# Patient Record
Sex: Male | Born: 1940 | Race: White | Hispanic: No | Marital: Married
Health system: Southern US, Academic
[De-identification: ages and names within clinical notes are randomized; demographics above are authoritative.]

## PROBLEM LIST (undated history)

## (undated) ENCOUNTER — Telehealth: Attending: Hematology & Oncology | Primary: Hematology & Oncology

## (undated) ENCOUNTER — Encounter: Attending: Hematology & Oncology | Primary: Hematology & Oncology

## (undated) ENCOUNTER — Encounter

## (undated) ENCOUNTER — Telehealth
Attending: Pharmacist Clinician (PhC)/ Clinical Pharmacy Specialist | Primary: Pharmacist Clinician (PhC)/ Clinical Pharmacy Specialist

## (undated) ENCOUNTER — Non-Acute Institutional Stay: Payer: MEDICARE | Attending: Hematology & Oncology | Primary: Hematology & Oncology

## (undated) ENCOUNTER — Telehealth

## (undated) ENCOUNTER — Ambulatory Visit

## (undated) DIAGNOSIS — I482 Chronic atrial fibrillation, unspecified: Secondary | ICD-10-CM

## (undated) DIAGNOSIS — N3289 Other specified disorders of bladder: Secondary | ICD-10-CM

## (undated) DIAGNOSIS — G4733 Obstructive sleep apnea (adult) (pediatric): Secondary | ICD-10-CM

## (undated) DIAGNOSIS — Z9989 Dependence on other enabling machines and devices: Secondary | ICD-10-CM

## (undated) DIAGNOSIS — R7303 Prediabetes: Secondary | ICD-10-CM

## (undated) DIAGNOSIS — G629 Polyneuropathy, unspecified: Secondary | ICD-10-CM

## (undated) DIAGNOSIS — I5032 Chronic diastolic (congestive) heart failure: Secondary | ICD-10-CM

## (undated) DIAGNOSIS — F4310 Post-traumatic stress disorder, unspecified: Secondary | ICD-10-CM

## (undated) DIAGNOSIS — N400 Enlarged prostate without lower urinary tract symptoms: Secondary | ICD-10-CM

## (undated) DIAGNOSIS — Z7901 Long term (current) use of anticoagulants: Secondary | ICD-10-CM

## (undated) DIAGNOSIS — Z87898 Personal history of other specified conditions: Secondary | ICD-10-CM

## (undated) DIAGNOSIS — M549 Dorsalgia, unspecified: Secondary | ICD-10-CM

## (undated) DIAGNOSIS — I4891 Unspecified atrial fibrillation: Secondary | ICD-10-CM

## (undated) DIAGNOSIS — K5730 Diverticulosis of large intestine without perforation or abscess without bleeding: Secondary | ICD-10-CM

## (undated) DIAGNOSIS — C642 Malignant neoplasm of left kidney, except renal pelvis: Secondary | ICD-10-CM

## (undated) DIAGNOSIS — R609 Edema, unspecified: Secondary | ICD-10-CM

## (undated) DIAGNOSIS — I10 Essential (primary) hypertension: Secondary | ICD-10-CM

## (undated) DIAGNOSIS — G250 Essential tremor: Secondary | ICD-10-CM

## (undated) HISTORY — DX: Post-traumatic stress disorder, unspecified: F43.10

## (undated) HISTORY — DX: Essential (primary) hypertension: I10

## (undated) HISTORY — DX: Dorsalgia, unspecified: M54.9

## (undated) HISTORY — DX: Malignant neoplasm of left kidney, except renal pelvis: C64.2

## (undated) HISTORY — PX: NEPHRECTOMY: SHX65

## (undated) HISTORY — DX: Edema, unspecified: R60.9

## (undated) HISTORY — DX: Unspecified atrial fibrillation: I48.91

## (undated) MED ORDER — APIXABAN 5 MG TABLET: ORAL | 0 days

---
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
## 1976-12-19 HISTORY — PX: VASECTOMY REVERSAL: SHX243

## 2000-11-30 ENCOUNTER — Encounter (INDEPENDENT_AMBULATORY_CARE_PROVIDER_SITE_OTHER): Payer: Self-pay | Admitting: Specialist

## 2000-11-30 ENCOUNTER — Ambulatory Visit (HOSPITAL_COMMUNITY): Admission: RE | Admit: 2000-11-30 | Discharge: 2000-11-30 | Payer: Self-pay | Admitting: Gastroenterology

## 2002-04-24 ENCOUNTER — Encounter: Admission: RE | Admit: 2002-04-24 | Discharge: 2002-04-24 | Payer: Self-pay | Admitting: Interventional Cardiology

## 2002-04-24 ENCOUNTER — Encounter: Payer: Self-pay | Admitting: Interventional Cardiology

## 2003-04-14 ENCOUNTER — Ambulatory Visit (HOSPITAL_COMMUNITY): Admission: RE | Admit: 2003-04-14 | Discharge: 2003-04-14 | Payer: Self-pay | Admitting: Gastroenterology

## 2004-02-26 ENCOUNTER — Ambulatory Visit (HOSPITAL_COMMUNITY): Admission: RE | Admit: 2004-02-26 | Discharge: 2004-02-26 | Payer: Self-pay | Admitting: Family Medicine

## 2006-12-05 ENCOUNTER — Encounter: Admission: RE | Admit: 2006-12-05 | Discharge: 2007-01-25 | Payer: Self-pay | Admitting: Family Medicine

## 2008-11-10 ENCOUNTER — Inpatient Hospital Stay (HOSPITAL_COMMUNITY): Admission: RE | Admit: 2008-11-10 | Discharge: 2008-11-14 | Payer: Self-pay | Admitting: Orthopedic Surgery

## 2008-11-10 HISTORY — PX: TOTAL KNEE ARTHROPLASTY: SHX125

## 2008-12-01 ENCOUNTER — Encounter: Admission: RE | Admit: 2008-12-01 | Discharge: 2008-12-17 | Payer: Self-pay | Admitting: Orthopedic Surgery

## 2008-12-22 ENCOUNTER — Encounter: Admission: RE | Admit: 2008-12-22 | Discharge: 2009-01-01 | Payer: Self-pay | Admitting: Orthopedic Surgery

## 2009-10-01 ENCOUNTER — Ambulatory Visit (HOSPITAL_COMMUNITY): Admission: RE | Admit: 2009-10-01 | Discharge: 2009-10-01 | Payer: Self-pay | Admitting: Gastroenterology

## 2011-05-03 NOTE — Op Note (Signed)
NAME:  Evan Kirk, Evan Kirk NO.:  [id number]   MEDICAL RECORD NO.:  [id number]          PATIENT TYPE:  INP   LOCATION:  NA                           FACILITY:  [facility]   PHYSICIAN:  Ollen Gross, M.D.    DATE OF BIRTH:  1941/04/03   DATE OF PROCEDURE:  11/10/2008  DATE OF DISCHARGE:                               OPERATIVE REPORT   PREOPERATIVE DIAGNOSIS:  Osteoarthritis, right knee.   POSTOPERATIVE DIAGNOSIS:  Osteoarthritis, right knee.   PROCEDURE:  Right total knee arthroplasty.   SURGEON:  Ollen Gross, M.D.   ASSISTANT:  Avel Peace, PA-C   ANESTHESIA:  Spinal with Duramorph.   ESTIMATED BLOOD LOSS:  Minimal.   DRAINS:  None.   TOURNIQUET TIME:  32 minutes at 300 mmHg.   COMPLICATIONS:  None.   CONDITION:  Stable to recovery room.   CLINICAL NOTE:  Mr. Evan Kirk is a 70 year old male who has end-stage  arthritis of the right knee with progressively worsening pain and  dysfunction.  He has failed nonoperative management and presents now for  right total knee arthroplasty.   PROCEDURE IN DETAIL:  After the successful administration of spinal  anesthetic, a tourniquet was placed high on his right thigh and right  lower extremity was prepped and draped in the usual sterile fashion.  Extremity was wrapped in Esmarch, knee flexed and tourniquet inflated to  300 mmHg.  Midline incision made with a 10 blade through subcutaneous  tissue to the level of the extensor mechanism.  A fresh blade is used  make a medial parapatellar arthrotomy.  Soft tissue over the proximal  medial tibia subperiosteally elevated to the joint line with the knife  and into the semimembranosus bursa with a Cobb elevator.  Soft tissue  laterally is elevated with attention being paid to avoiding the patellar  tendon on tibial tubercle.  Patella was subluxed laterally, knee flexed  90 degrees and ACL and PCL removed.  Drill was used create a starting  hole in the distal femur and the  canal was thoroughly irrigated.  The 5  degrees right valgus alignment guide is placed referencing off the  posterior condyles.  Rotation is marked and the block pinned to remove  10 mm of the distal femur.  Distal femoral resection is made with an  oscillating saw.  Sizing block is placed, size 3 is most appropriate.  Rotations marked at the epicondylar axis.  Size 3 cutting blocks placed  and the anterior, posterior and chamfer cuts made.   Tibia was subluxed forward and menisci removed.  The extramedullary  tibial alignment guide is placed referencing proximally at the medial  aspect of the tibial tubercle and distally along the second metatarsal  axis and tibial crest.  Blocks pinned to remove about 10 mm from the  nondeficient lateral side.  Tibial resection is made with an oscillating  saw.  Size 3 is the most appropriate tibial component and the proximal  tibia is prepared with the modular drill and keel punch for a size 3.  Femoral preparation is completed with the intercondylar cut.   Size  3 mobile bearing tibial trial, size 3 posterior stabilized femoral  trial and a 10 mm posterior stabilized rotating platform insert trial  was placed.  With the 10, hyperextension occurs so I went to 12.5 which  allows for full extension with excellent varus-valgus, anterior-  posterior balance throughout full range of motion.  Patella was then  everted and was measured to be 25 mm.  Freehand resection taken to 15  mm, 38 template is placed, lug holes were drilled, trial patella was  placed and it tracks normally.  Osteophytes were removed off the  posterior femur with the trial placed.  All trials were removed and the  cut bone surfaces are prepared with pulsatile lavage.  Cement was mixed  and once ready for implantation, a size 3 mobile bearing tibial tray,  size 3 posterior stabilized femur and 38 patella were cemented into  place and patella was held with clamp.  Trial 12.5-mm inserts  placed,  knee held in full extension and all extruded cement removed.  When the  cement was fully hardened, then the permanent 12.5 mm posterior  stabilized rotating platform insert is placed in tibial tray.  Wound was  copiously irrigated with saline solution and FloSeal injected on the  posterior capsule, medial and lateral gutters and suprapatellar area.  Moist sponge is placed and tourniquet released for a total time of 32  minutes.  The sponge was held for 2 minutes then removed.  Minimal  bleeding is encountered.  The bleeding that is encountered stopped with  electrocautery.  The wound was again irrigated and arthrotomy closed  with interrupted #1 PDS.  Flexion against gravity about 135 degrees.  Subcu closed with interrupted 2-0 Vicryl and subcuticular running 4-0  Monocryl.  Incisions cleaned and dried and Steri-Strips and bulky  sterile dressing applied.  He is then placed into a knee immobilizer,  awakened and transferred to recovery in stable condition.      Ollen Gross, M.D.  Electronically Signed     FA/MEDQ  D:  11/10/2008  T:  11/10/2008  Job:  [id number]

## 2011-05-03 NOTE — Discharge Summary (Signed)
Evan Kirk, Evan Kirk NO.:  [id number]   MEDICAL RECORD NO.:  [id number]          PATIENT TYPE:  INP   LOCATION:  1613                         FACILITY:  [facility]   PHYSICIAN:  Ollen Gross, M.D.    DATE OF BIRTH:  Jul 21, 1941   DATE OF ADMISSION:  11/10/2008  DATE OF DISCHARGE:  11/14/2008                               DISCHARGE SUMMARY   ADMITTING DIAGNOSES:  1. Osteoarthritis bilateral knees, right greater than left.  2. Sleep apnea.  Uses BiPAP.  3. Chronic atrial fibrillation.  4. Diverticulosis.  5. Enlarged prostate.  6. Arthritis.   DISCHARGE DIAGNOSES:  1. Osteoarthritis right knee status post right total knee replacement      arthroplasty.  2. Sleep apnea.  Uses BiPAP.  3. Chronic atrial fibrillation.  4. Diverticulosis.  5. Enlarged prostate.  6. Arthritis.   PROCEDURE:  November 10, 2008 right total knee.  Surgeon, Dr. Lequita Kirk.  Assistant, Evan Peace, PA-C.  Anesthesia, spinal with Duramorph.   CONSULTS:  None.   BRIEF HISTORY:  Evan Kirk is a 70 year old male with end-stage  arthritis right knee, progressive worsening pain dysfunction, mentioned  now presents for total knee.   LABORATORY DATA:  Preop CBC hemoglobin 15.1, hematocrit of 44.9, white  cell count 7.4, platelets 319,000.  Postop hemoglobin 12.1 drifted down  to 11.3.  Last H and H 11.4 and 32.9.  PT/PTT preop 21 and 65  respectively.  INR 1.7.  Serial pro time followed per Coumadin protocol.  Last PT/INR 25.1-2.1.  Chem panel on admission all within normal limits.  Serial BMET followed.  Electrolytes remained within normal limits.  Preop UA negative.  Blood type A negative.   EKG November 12, 2008, atrial fib with rapid ventricular response.  Left  axis deviation.  Two-view chest preop October 30, 2008 no acute  cardiopulmonary disease.  Preop EKG dated October 06, 2008 atrial  fibrillation, left axis anterior fascicular block.   HOSPITAL COURSE:  The patient was admitted  to [facility]  tolerated procedure well, later transferred to the orthopedic floor  started on PCA and p.o. pain control following surgery.  Pretty rough  night following surgery.  A little bit better on the morning of day 1.  Better pain control.  Chronic atrial fib was rate-controlled.  He was  put back on his medications.  Also chronic Coumadin.  So his Coumadin  had Lovenox bridge postoperatively.  Started back on his BiPAP.  Decent  output, although a little on the low side, so we gave some gentle fluids  to help out with his urinary output.  His pressure was stable postop.  He started out of bed and actually walked about 30 feet on day 1.  By  day 2 dressing change incision looked good, pain was under a little bit  better control, but his heart rate had increased a little bit.  Checked  an EKG which did show atrial fib.  He was known to have chronic atrial  fib, so we spoke with Dr. Michaelle Kirk office on the phone and I spoke Dr.  Katrinka Kirk and increased  his metoprolol to twice a day.  By day 3, his rate  was better and his pulse rate had slowed down.  He was feeling better.  Continued with therapy walking about 50 feet by postop day #4, November 14, 2008.  He was rate controlled on metoprolol.  Incision was healing  well, was progressing with this therapy and was discharged home.   DISCHARGE/PLAN:  1. The patient was discharged home November 27.  2. Discharge diagnoses please see above.  3. Discharge meds:  Coumadin, Percocet, Robaxin, metoprolol 25 mg      twice a day.  4. Diet, heart-healthy diet.  5. Activity:  He is weightbearing as tolerated.  Total knee protocol.      Home health PT and home health nursing.  6. To follow up in 2 weeks with Dr. Lequita Kirk.  7. Also follow up in 2 weeks with Evan Kirk.   DISPOSITION:  Home.   CONDITION ON DISCHARGE:  Improved.      Evan Kirk, P.A.C.      Ollen Gross, M.D.  Electronically Signed    ALP/MEDQ   D:  12/17/2008  T:  12/17/2008  Job:  [id number]

## 2011-05-06 NOTE — Op Note (Signed)
   NAME:  Evan Kirk, Evan Kirk NO.:  [id number]   MEDICAL RECORD NO.:  [id number]                   PATIENT TYPE:  AMB   LOCATION:  ENDO                                 FACILITY:  [facility]   PHYSICIAN:  Danise Edge, M.D.                DATE OF BIRTH:  04/16/1941   DATE OF PROCEDURE:  04/14/2003  DATE OF DISCHARGE:                                 OPERATIVE REPORT   PROCEDURE:  Colonoscopy.   INDICATIONS FOR PROCEDURE:  [name] is a 70 year old male born  1941-12-15. [name] is undergoing diagnostic colonoscopy to  evaluate guaiac positive stool. He has undergone two colonoscopies in the  last 10 years to remove neoplastic but noncancerous colon polyps.   ENDOSCOPIST:  Charolett Bumpers, M.D.   PREMEDICATION:  Versed 7.5 mg, Demerol 70 mg .   DESCRIPTION OF PROCEDURE:  After obtaining informed consent, [name] was  placed in the left lateral decubitus position. I administered intravenous  Demerol and intravenous Versed to achieve conscious sedation for the  procedure. The patient's blood pressure, oxygen saturation and cardiac  rhythm were monitored throughout the procedure and documented in the medical  record.   Anal inspection was normal. Digital rectal exam was normal. The Olympus  adult colonoscope was introduced into the rectum and advanced to the cecum.  Colonic preparation for the exam today was satisfactory.   [name] has universal colonic diverticulosis without diverticulitis or  diverticular stricture formation.   RECTUM:  Normal.   SIGMOID COLON AND DESCENDING COLON:  Normal.   SPLENIC FLEXURE:  Normal.   TRANSVERSE COLON:  Normal.   HEPATIC FLEXURE:  Normal.   ASCENDING COLON:  Normal.   CECUM AND ILEOCECAL VALVE:  Normal.    ASSESSMENT:  Universal colonic diverticulosis; otherwise, normal  proctocolonoscopy to the cecum.   RECOMMENDATIONS:  Repeat colonoscopy in five years.                               Danise Edge, M.D.    MJ/MEDQ  D:  04/14/2003  T:  04/14/2003  Job:  [id number]

## 2011-05-06 NOTE — Procedures (Signed)
[facility]  Patient:    Evan Kirk, Evan Kirk                    MRN: [id number] Proc. Date: 11/30/00 Attending:  Verlin Grills, M.D.                           Procedure Report  DATE OF BIRTH:  1941/09/01  REFERRING PHYSICIAN:  Dellis Anes. Idell Pickles, M.D.  PROCEDURE PERFORMED:  Colonoscopy.  ENDOSCOPIST:  Verlin Grills, M.D.  INDICATIONS FOR PROCEDURE:  The patient is a 70 year old male. He underwent a colonoscopy in 1996 and a small neoplastic polyp was removed.  He is scheduled for surveillance colonoscopy.  I discussed with [name] the complications associated with colonoscopy and polypectomy including intestinal bleeding and intestinal perforation. [name] has signed the operative permit. PREMEDICATION:  Demerol 50 mg, Versed 5 mg.  ENDOSCOPE:  Pediatric Olympus video colonoscope.  DESCRIPTION OF PROCEDURE:  After obtaining informed consent, the patient was placed in the left lateral decubitus position.  I administered intravenous Demerol and intravenous Versed to achieve sedation for the procedure.  The patients blood pressure, oxygen saturation and cardiac rhythm were monitored throughout the procedure and documented in the medical record.  Anal inspection was normal.  Digital rectal exam was normal.  The Olympus pediatric video colonoscope was then introduced into the rectum and under direct vision, advanced to the cecum as identified by a normal-appearing ileocecal valve.  Colonic preparation for the exam today was excellent.  Rectum:  Normal.  Sigmoid colon and descending colon:  Extensive left colonic diverticulosis.  Splenic flexure:  Normal.  Transverse colon:  Normal.  Hepatic flexure:  Normal.  Ascending colon:  From the ascending colon, five 1-2 mm sessile polyps were removed with both the hot biopsy forceps and the cold biopsy forceps.  All polyps were submitted in one bottle for pathologic  evaluation.  Cecum and ileocecal valve:  Normal.  ASSESSMENT: 1. Left colonic diverticulosis. 2. Five 1 to 2 mm ascending colon polyps removed and submitted for    pathological interpretation.  RECOMMENDATIONS:  If polyps return neoplastic, [name] needs a repeat colonoscopy in approximately five years. DD:  11/30/00 TD:  11/30/00 Job: [id number]
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
## 2011-09-20 LAB — CBC
HCT: 32.9 — ABNORMAL LOW
HCT: 35 — ABNORMAL LOW
HCT: 44.9
Hemoglobin: 11.3 — ABNORMAL LOW
Hemoglobin: 11.4 — ABNORMAL LOW
Hemoglobin: 12.1 — ABNORMAL LOW
Hemoglobin: 15.1
MCHC: 33.5
MCHC: 34.6
MCV: 92.6
Platelets: 302
Platelets: 319
RBC: 3.49 — ABNORMAL LOW
RBC: 3.53 — ABNORMAL LOW
RBC: 4.85
RDW: 13.3
WBC: 10.4
WBC: 11.5 — ABNORMAL HIGH
WBC: 7.4
WBC: 9

## 2011-09-20 LAB — COMPREHENSIVE METABOLIC PANEL
BUN: 5 — ABNORMAL LOW
CO2: 28
Chloride: 106
Creatinine, Ser: 0.81
GFR calc non Af Amer: >60
Total Bilirubin: 1

## 2011-09-20 LAB — ABO/RH: ABO/RH(D): A NEG

## 2011-09-20 LAB — BASIC METABOLIC PANEL
BUN: 8
CO2: 29
Calcium: 8.1 — ABNORMAL LOW
GFR calc Af Amer: >60
GFR calc non Af Amer: >60
GFR calc non Af Amer: >60
Potassium: 3.5
Sodium: 135
Sodium: 139

## 2011-09-20 LAB — APTT: aPTT: 65 — ABNORMAL HIGH

## 2011-09-20 LAB — URINALYSIS, ROUTINE W REFLEX MICROSCOPIC
Bilirubin Urine: NEGATIVE
Ketones, ur: NEGATIVE
Nitrite: NEGATIVE
Protein, ur: NEGATIVE
Urobilinogen, UA: 0.2

## 2011-09-20 LAB — PROTIME-INR
INR: 1.6 — ABNORMAL HIGH
INR: 1.7 — ABNORMAL HIGH
INR: 2.1 — ABNORMAL HIGH
Prothrombin Time: 25.1 — ABNORMAL HIGH

## 2011-09-20 LAB — TYPE AND SCREEN

## 2013-10-28 ENCOUNTER — Encounter: Payer: Self-pay | Admitting: Interventional Cardiology

## 2013-10-29 ENCOUNTER — Telehealth: Payer: Self-pay

## 2013-10-29 MED ORDER — METOPROLOL TARTRATE 50 MG PO TABS: 25.0000 mg | ORAL_TABLET | Freq: Two times a day (BID) | ORAL | Status: DC

## 2013-10-29 NOTE — Telephone Encounter (Signed)
called Evan Kirk to verify refill rqst. Evan Kirk sts that he needs metoprolol tartrate 50mg   refilled and sent to [facility] on [address] in [place]

## 2014-01-11 ENCOUNTER — Encounter: Payer: Self-pay | Admitting: *Deleted

## 2014-01-11 ENCOUNTER — Encounter: Payer: Self-pay | Admitting: Interventional Cardiology

## 2014-01-11 DIAGNOSIS — K5732 Diverticulitis of large intestine without perforation or abscess without bleeding: Secondary | ICD-10-CM | POA: Insufficient documentation

## 2014-01-11 DIAGNOSIS — I509 Heart failure, unspecified: Secondary | ICD-10-CM | POA: Insufficient documentation

## 2014-01-11 DIAGNOSIS — I10 Essential (primary) hypertension: Secondary | ICD-10-CM | POA: Insufficient documentation

## 2014-01-11 DIAGNOSIS — G4733 Obstructive sleep apnea (adult) (pediatric): Secondary | ICD-10-CM | POA: Insufficient documentation

## 2014-01-11 DIAGNOSIS — M549 Dorsalgia, unspecified: Secondary | ICD-10-CM | POA: Insufficient documentation

## 2014-01-11 DIAGNOSIS — E7800 Pure hypercholesterolemia, unspecified: Secondary | ICD-10-CM | POA: Insufficient documentation

## 2014-01-11 DIAGNOSIS — I4891 Unspecified atrial fibrillation: Secondary | ICD-10-CM | POA: Insufficient documentation

## 2014-01-11 DIAGNOSIS — E669 Obesity, unspecified: Secondary | ICD-10-CM | POA: Insufficient documentation

## 2014-01-11 DIAGNOSIS — R609 Edema, unspecified: Secondary | ICD-10-CM | POA: Insufficient documentation

## 2014-01-13 ENCOUNTER — Ambulatory Visit: Payer: Self-pay | Admitting: Interventional Cardiology

## 2014-01-16 ENCOUNTER — Encounter: Payer: Self-pay | Admitting: Interventional Cardiology

## 2014-01-16 ENCOUNTER — Ambulatory Visit (INDEPENDENT_AMBULATORY_CARE_PROVIDER_SITE_OTHER): Payer: Medicare Other | Admitting: Interventional Cardiology

## 2014-01-16 VITALS — BP 136/81 | HR 60 | Ht 67.0 in | Wt 300.0 lb

## 2014-01-16 DIAGNOSIS — I4891 Unspecified atrial fibrillation: Secondary | ICD-10-CM

## 2014-01-16 DIAGNOSIS — E7800 Pure hypercholesterolemia, unspecified: Secondary | ICD-10-CM

## 2014-01-16 DIAGNOSIS — I509 Heart failure, unspecified: Secondary | ICD-10-CM

## 2014-01-16 DIAGNOSIS — G4733 Obstructive sleep apnea (adult) (pediatric): Secondary | ICD-10-CM

## 2014-01-16 DIAGNOSIS — I5032 Chronic diastolic (congestive) heart failure: Secondary | ICD-10-CM | POA: Insufficient documentation

## 2014-01-16 DIAGNOSIS — I10 Essential (primary) hypertension: Secondary | ICD-10-CM

## 2014-01-16 DIAGNOSIS — I5030 Unspecified diastolic (congestive) heart failure: Secondary | ICD-10-CM

## 2014-01-16 LAB — BASIC METABOLIC PANEL
BUN: 12 mg/dL (ref 6–23)
CALCIUM: 9.5 mg/dL (ref 8.4–10.5)
CO2: 26 meq/L (ref 19–32)
CREATININE: 0.9 mg/dL (ref 0.4–1.5)
Chloride: 106 mEq/L (ref 96–112)
GFR: 94.05 mL/min (ref >60.00)
GLUCOSE: 108 mg/dL — AB (ref 70–99)
Potassium: 4.2 mEq/L (ref 3.5–5.1)
Sodium: 138 mEq/L (ref 135–145)

## 2014-01-16 MED ORDER — SPIRONOLACTONE 25 MG PO TABS: 25.0000 mg | ORAL_TABLET | Freq: Two times a day (BID) | ORAL | Status: DC

## 2014-01-16 NOTE — Patient Instructions (Signed)
Increase Spironolactone to 25mg  twice daily  Take all other medications as prescribed  Lab Today: Bmet  Your physician wants you to follow-up in: 1 year You will receive a reminder letter in the mail two months in advance. If you don't receive a letter, please call our office to schedule the follow-up appointment.

## 2014-01-16 NOTE — Progress Notes (Signed)
Patient ID: Evan Kirk, male   DOB: April 02, 1941, 73 y.o.   MRN: [id number] Past Medical History  Chronic atrial fibrillation   Chronic osteoarthritis   Hypertension   PTSD   Sleep apnea with CPAP and oxygen   diastolic heart failure, LVEF 55%, 2008      [address], [address], [place]  [address] Phone: [phone] Fax:  [phone]  Date:  01/16/2014   ID:  Evan Kirk, DOB 06-Mar-1941, MRN [id number]  PCP:  No primary provider on file.   ASSESSMENT:  1. Chronic atrial fibrillation with controlled ventricular response 2. Chronic diastolic heart failure, improved on spiral lactone 3. Morbid obesity 4. Obstructive sleep apnea  PLAN:  1.Basic metabolic panel today 2. Clinical followup in 6-12 months 3. Continue current medical regimen   SUBJECTIVE: Evan Kirk is a 73 y.o. male reports that his lower extremity swelling is improved. Breathing is about the same. He has not had lightheadedness or dizziness. He is having some psychiatric issues. He denies angina. No syncope. He denies orthopnea or   Wt Readings from Last 3 Encounters:  01/16/14 300 lb (136.079 kg)     Past Medical History  Diagnosis Date  . HTN (hypertension)   . Hypercholesteremia   . Back pain   . Atrial fibrillation   . Obesity   . Heart failure   . Edema   . HF (heart failure)   . Diverticulitis of colon (without mention of hemorrhage)   . OSA (obstructive sleep apnea)     Current Outpatient Prescriptions  Medication Sig Dispense Refill  . cholecalciferol (VITAMIN D) 1000 UNITS tablet Take 2,000 Units by mouth daily.      Marland Kitchen loratadine (CLARITIN) 10 MG tablet Take 10 mg by mouth daily.      . metoprolol (LOPRESSOR) 50 MG tablet Take 0.5 tablets (25 mg total) by mouth 2 (two) times daily.  30 tablet  6  . Multiple Vitamin (MULTIVITAMIN) capsule Take 1 capsule by mouth daily.      Marland Kitchen spironolactone (ALDACTONE) 25 MG tablet Take 25 mg by mouth daily.      Marland Kitchen  terazosin (HYTRIN) 5 MG capsule Take 5 mg by mouth at bedtime.      . traMADol (ULTRAM) 50 MG tablet Take by mouth every 6 (six) hours as needed.      . warfarin (COUMADIN) 5 MG tablet Take 5 mg by mouth as directed.       No current facility-administered medications for this visit.    Allergies:   Allergies not on file  Social History:  The patient  reports that he has quit smoking. He does not have any smokeless tobacco history on file.   ROS:  Please see the history of present illness.   Unable to exercise. Dyspnea on exertion.   All other systems reviewed and negative.   OBJECTIVE: VS:  BP 136/81  Pulse 60  Ht 5\' 7"  (1.702 m)  Wt 300 lb (136.079 kg)  BMI 46.98 kg/m2 Well nourished, well developed, in no acute distress, marked abdominal obesity HEENT: normal Neck: JVD flat. Carotid bruit absent  Cardiac:  normal S1, S2; IIRR; no murmur Lungs:  clear to auscultation bilaterally, no wheezing, rhonchi or rales Abd: soft, nontender, no hepatomegaly Ext: Edema  Trace to 1+ bilateral . Pulses 1+ bilateral  Skin: warm and dry Neuro:  CNs 2-12 intact, no focal abnormalities noted  EKG:  Atrial fibrillation with controlled rate with left axis  deviation and poor R-wave progression   unchanged from prior tracings.   Signed, Illene Labrador III, MD 01/16/2014 9:14 AM

## 2014-01-22 ENCOUNTER — Telehealth: Payer: Self-pay

## 2014-01-22 NOTE — Telephone Encounter (Signed)
Message copied by Lamar Laundry on Wed Jan 22, 2014 10:14 AM ------      Message from: Daneen Schick      Created: Fri Jan 17, 2014  5:19 PM       Normal labs including potassium ------

## 2014-01-22 NOTE — Telephone Encounter (Signed)
pt aware of lab results.Normal labs including potassium.pt verbalized understanding.pt rqst copy be mailed to him...done

## 2014-06-17 ENCOUNTER — Telehealth: Payer: Self-pay | Admitting: Interventional Cardiology

## 2014-06-17 NOTE — Telephone Encounter (Signed)
returned pt call. pt sts that he was seen by his [address] physician who is concerned that he might have parkinson and is ref him to neurology for a neurological work up and carotids. .pt has been having some muscled tremor's and dizziness.an ekg was doneat the visit and was told it was abnormal. asked pt if he could provide [place] with a copy so that Dr.Smith could review it and compare it to his previous ekg.pt sts that he will rqst to have a copy fwd to our office.pt sts that he has had increased sob.denies chest pain, pt is in chronic afib.adv pt I will have Dr.Smith compare that ekg to his prior and callback with his recommendation. Pt agreeable and verbalized understanding

## 2014-06-17 NOTE — Telephone Encounter (Signed)
returned pt call. lmtcb 

## 2014-06-17 NOTE — Telephone Encounter (Signed)
F/u ° ° °Pt returning your call °

## 2014-06-17 NOTE — Telephone Encounter (Signed)
New Message  Pt wife called states the pt recently had a EKG at the [place] has possible plaque in his arteries.Marland Kitchen experiencing SOB// Requesting a call back to discuss.

## 2014-06-26 ENCOUNTER — Encounter: Payer: Self-pay | Admitting: Interventional Cardiology

## 2015-01-20 ENCOUNTER — Encounter: Payer: Self-pay | Admitting: Interventional Cardiology

## 2015-01-20 ENCOUNTER — Ambulatory Visit (INDEPENDENT_AMBULATORY_CARE_PROVIDER_SITE_OTHER): Payer: Medicare Other | Admitting: Interventional Cardiology

## 2015-01-20 VITALS — BP 120/70 | HR 70 | Ht 66.0 in | Wt 298.4 lb

## 2015-01-20 DIAGNOSIS — I482 Chronic atrial fibrillation, unspecified: Secondary | ICD-10-CM

## 2015-01-20 DIAGNOSIS — I10 Essential (primary) hypertension: Secondary | ICD-10-CM

## 2015-01-20 DIAGNOSIS — G20 Parkinson's disease: Secondary | ICD-10-CM

## 2015-01-20 DIAGNOSIS — G4733 Obstructive sleep apnea (adult) (pediatric): Secondary | ICD-10-CM

## 2015-01-20 DIAGNOSIS — I5032 Chronic diastolic (congestive) heart failure: Secondary | ICD-10-CM

## 2015-01-20 LAB — BASIC METABOLIC PANEL
BUN: 14 mg/dL (ref 6–23)
CO2: 26 meq/L (ref 19–32)
Calcium: 9.8 mg/dL (ref 8.4–10.5)
Chloride: 104 mEq/L (ref 96–112)
Creatinine, Ser: 0.96 mg/dL (ref 0.40–1.50)
GFR: 81.5 mL/min (ref >60.00)
GLUCOSE: 104 mg/dL — AB (ref 70–99)
POTASSIUM: 4.1 meq/L (ref 3.5–5.1)
SODIUM: 136 meq/L (ref 135–145)

## 2015-01-20 NOTE — Progress Notes (Signed)
Patient ID: Evan Kirk, male   DOB: 1941/02/04, 74 y.o.   MRN: [id number]    Cardiology Office Note   Date:  01/20/2015   ID:  Evan Kirk, DOB 1941/03/29, MRN [id number]  PCP:   Melinda Crutch, MD  Cardiologist:   Sinclair Grooms, MD   No chief complaint on file.     History of Present Illness: Evan Kirk is a 74 y.o. male who presents for diastolic heart failure, essential hypertension, and chronic atrial fibrillation. He has no cardiopulmonary complaints. He has not had syncope or chest pain. There is no peripheral edema, orthopnea, or PND.    Past Medical History  Diagnosis Date  . HTN (hypertension)   . Hypercholesteremia   . Back pain   . Atrial fibrillation   . Obesity   . Heart failure   . Edema   . HF (heart failure)   . Diverticulitis of colon (without mention of hemorrhage)   . OSA (obstructive sleep apnea)     Past Surgical History  Procedure Laterality Date  . Knee surgery       Current Outpatient Prescriptions  Medication Sig Dispense Refill  . cholecalciferol (VITAMIN D) 1000 UNITS tablet Take 2,000 Units by mouth daily.    Marland Kitchen loratadine (CLARITIN) 10 MG tablet Take 10 mg by mouth daily.    . metoprolol (LOPRESSOR) 50 MG tablet Take 0.5 tablets (25 mg total) by mouth 2 (two) times daily. 30 tablet 6  . Multiple Vitamin (MULTIVITAMIN) capsule Take 1 capsule by mouth daily.    Marland Kitchen spironolactone (ALDACTONE) 25 MG tablet Take 1 tablet (25 mg total) by mouth 2 (two) times daily.    Marland Kitchen terazosin (HYTRIN) 5 MG capsule Take 5 mg by mouth at bedtime.    . traMADol (ULTRAM) 50 MG tablet Take by mouth every 6 (six) hours as needed.    . warfarin (COUMADIN) 5 MG tablet Take 5 mg by mouth as directed. M/w/f/ 7.5 mg then t/th/sa/sun is 5 mg daily by mouth     No current facility-administered medications for this visit.    Allergies:   Diltiazem    Social History:  The patient  reports that he has quit smoking. He does not have any smokeless tobacco  history on file.   Family History:  The patient's positive for family history is not on file.    ROS:  Please see the history of present illness.   Otherwise, review of systems are positive for easy bruising on warfarin. Followed at the [facility].   All other systems are reviewed and negative.    PHYSICAL EXAM: VS:  BP 120/70 mmHg  Pulse 70  Ht 5\' 6"  (1.676 m)  Wt 298 lb 6.4 oz (135.353 kg)  BMI 48.19 kg/m2  SpO2 97% , BMI Body mass index is 48.19 kg/(m^2). GEN: Well nourished, well developed, in no acute distress HEENT: normal Neck: no JVD, carotid bruits, or masses Cardiac: IIRR; no murmurs, rubs, or gallops,no edema  Respiratory:  clear to auscultation bilaterally, normal work of breathing GI: soft, nontender, nondistended, + BS MS: no deformity or atrophy Skin: warm and dry, no rash Neuro:  Strength and sensation are intact Psych: euthymic mood, full affect   EKG:  EKG is ordered today. The ekg ordered today demonstrates atrial fibrillation with controlled ventricular response. Otherwise unremarkable.   Recent Labs: No results found for requested labs within last 365 days.    Lipid Panel No results found for: CHOL,  TRIG, HDL, CHOLHDL, VLDL, LDLCALC, LDLDIRECT    Wt Readings from Last 3 Encounters:  01/20/15 298 lb 6.4 oz (135.353 kg)  01/16/14 300 lb (136.079 kg)      Other studies Reviewed: Additional studies/ records that were reviewed today include:  none. Review of the above records demonstrates:    ASSESSMENT AND PLAN:  1.   Chronic atrial fibrillation with rate control 2. Chronic anticoagulation therapy followed at the [facility] 3. Hypertension under excellent control 4. Chronic diastolic heart failure, asymptomatic   Current medicines are reviewed at length with the patient today.  The patient does not have concerns regarding medicines.  The following changes have been made:  no change  Labs/ tests ordered today include:    Orders Placed This Encounter  Procedures  . Basic metabolic panel  . EKG 12-Lead     Disposition:   FU wiH Smith in 1 Year   Signed, Sinclair Grooms, MD  01/20/2015 8:54 AM    [facility] [place], [place], [place]  [address] Phone: [phone]; Fax: [phone]

## 2015-01-20 NOTE — Patient Instructions (Addendum)
Your physician recommends that you have  lab work today--BMET.  Dr Tamala Julian recommends that you some physical activity every day..walking is good.   Low-Sodium Eating Plan Sodium raises blood pressure and causes water to be held in the body. Getting less sodium from food will help lower your blood pressure, reduce any swelling, and protect your heart, liver, and kidneys. We get sodium by adding salt (sodium chloride) to food. Most of our sodium comes from canned, boxed, and frozen foods. Restaurant foods, fast foods, and pizza are also very high in sodium. Even if you take medicine to lower your blood pressure or to reduce fluid in your body, getting less sodium from your food is important. WHAT IS MY PLAN? Most people should limit their sodium intake to 2,300 mg a day. Your health care provider recommends that you limit your sodium intake to __________ a day.  WHAT DO I NEED TO KNOW ABOUT THIS EATING PLAN? For the low-sodium eating plan, you will follow these general guidelines:  Choose foods with a % Daily Value for sodium of less than 5% (as listed on the food label).   Use salt-free seasonings or herbs instead of table salt or sea salt.   Check with your health care provider or pharmacist before using salt substitutes.   Eat fresh foods.  Eat more vegetables and fruits.  Limit canned vegetables. If you do use them, rinse them well to decrease the sodium.   Limit cheese to 1 oz (28 g) per day.   Eat lower-sodium products, often labeled as "lower sodium" or "no salt added."  Avoid foods that contain monosodium glutamate (MSG). MSG is sometimes added to [place] food and some canned foods.  Check food labels (Nutrition Facts labels) on foods to learn how much sodium is in one serving.  Eat more home-cooked food and less restaurant, buffet, and fast food.  When eating at a restaurant, ask that your food be prepared with less salt or none, if possible.  HOW DO I READ FOOD  LABELS FOR SODIUM INFORMATION? The Nutrition Facts label lists the amount of sodium in one serving of the food. If you eat more than one serving, you must multiply the listed amount of sodium by the number of servings. Food labels may also identify foods as:  Sodium free--Less than 5 mg in a serving.  Very low sodium--35 mg or less in a serving.  Low sodium--140 mg or less in a serving.  Light in sodium--50% less sodium in a serving. For example, if a food that usually has 300 mg of sodium is changed to become light in sodium, it will have 150 mg of sodium.  Reduced sodium--25% less sodium in a serving. For example, if a food that usually has 400 mg of sodium is changed to reduced sodium, it will have 300 mg of sodium. WHAT FOODS CAN I EAT? Grains Low-sodium cereals, including oats, puffed wheat and rice, and shredded wheat cereals. Low-sodium crackers. Unsalted rice and pasta. Lower-sodium bread.  Vegetables Frozen or fresh vegetables. Low-sodium or reduced-sodium canned vegetables. Low-sodium or reduced-sodium tomato sauce and paste. Low-sodium or reduced-sodium tomato and vegetable juices.  Fruits Fresh, frozen, and canned fruit. Fruit juice.  Meat and Other Protein Products Low-sodium canned tuna and salmon. Fresh or frozen meat, poultry, seafood, and fish. Lamb. Unsalted nuts. Dried beans, peas, and lentils without added salt. Unsalted canned beans. Homemade soups without salt. Eggs.  Dairy Milk. Soy milk. Ricotta cheese. Low-sodium or reduced-sodium cheeses. Yogurt.  Condiments Fresh and dried herbs and spices. Salt-free seasonings. Onion and garlic powders. Low-sodium varieties of mustard and ketchup. Lemon juice.  Fats and Oils Reduced-sodium salad dressings. Unsalted butter.  Other Unsalted popcorn and pretzels.  The items listed above may not be a complete list of recommended foods or beverages. Contact your dietitian for more options. WHAT FOODS ARE NOT  RECOMMENDED? Grains Instant hot cereals. Bread stuffing, pancake, and biscuit mixes. Croutons. Seasoned rice or pasta mixes. Noodle soup cups. Boxed or frozen macaroni and cheese. Self-rising flour. Regular salted crackers. Vegetables Regular canned vegetables. Regular canned tomato sauce and paste. Regular tomato and vegetable juices. Frozen vegetables in sauces. Salted french fries. Olives. Angie Fava. Relishes. Sauerkraut. Salsa. Meat and Other Protein Products Salted, canned, smoked, spiced, or pickled meats, seafood, or fish. Bacon, ham, sausage, hot dogs, corned beef, chipped beef, and packaged luncheon meats. Salt pork. Jerky. Pickled herring. Anchovies, regular canned tuna, and sardines. Salted nuts. Dairy Processed cheese and cheese spreads. Cheese curds. Blue cheese and cottage cheese. Buttermilk.  Condiments Onion and garlic salt, seasoned salt, table salt, and sea salt. Canned and packaged gravies. Worcestershire sauce. Tartar sauce. Barbecue sauce. Teriyaki sauce. Soy sauce, including reduced sodium. Steak sauce. Fish sauce. Oyster sauce. Cocktail sauce. Horseradish. Regular ketchup and mustard. Meat flavorings and tenderizers. Bouillon cubes. Hot sauce. Tabasco sauce. Marinades. Taco seasonings. Relishes. Fats and Oils Regular salad dressings. Salted butter. Margarine. Ghee. Bacon fat.  Other Potato and tortilla chips. Corn chips and puffs. Salted popcorn and pretzels. Canned or dried soups. Pizza. Frozen entrees and pot pies.  The items listed above may not be a complete list of foods and beverages to avoid. Contact your dietitian for more information. Document Released: 05/27/2002 Document Revised: 12/10/2013 Document Reviewed: 10/09/2013 [facility] Patient Information 2015 [place], [place]. This information is not intended to replace advice given to you by your health care provider. Make sure you discuss any questions you have with your health care provider. Your physician wants  you to follow-up in: 1 year with Dr Tamala Julian. (February 2017)  You will receive a reminder letter in the mail two months in advance. If you don't receive a letter, please call our office to schedule the follow-up appointment.

## 2015-05-08 ENCOUNTER — Ambulatory Visit (INDEPENDENT_AMBULATORY_CARE_PROVIDER_SITE_OTHER): Payer: Medicare Other | Admitting: Podiatry

## 2015-05-08 ENCOUNTER — Encounter: Payer: Self-pay | Admitting: Podiatry

## 2015-05-08 VITALS — BP 145/71 | HR 69 | Resp 12

## 2015-05-08 DIAGNOSIS — L03031 Cellulitis of right toe: Secondary | ICD-10-CM

## 2015-05-08 DIAGNOSIS — L03011 Cellulitis of right finger: Secondary | ICD-10-CM

## 2015-05-08 NOTE — Patient Instructions (Signed)

## 2015-05-08 NOTE — Progress Notes (Signed)
   Subjective:    Patient ID: Evan Kirk, male    DOB: 12-Apr-1941, 74 y.o.   MRN: [id number]  HPI  74 year old male presents the office today for right big toenail ingrown toenail infection. He states his been ongoing for the last couple weeks. He recently was seen at [facility] last night and was prescribed Bactrim. He continues to have redness to the toe as well as some pus coming from around the nail and the right big toe. He denies any red streaks. States his nails painful particularly with pressure. He has been soaking his foot in Epson salts without much relief. He is currently on Coumadin for A. fib and his INR at last check was 3.0. He denies any systemic complaints as fevers, chills, nausea, vomiting. No other complaints at this time.   Review of Systems  Musculoskeletal: Positive for gait problem.  Skin: Positive for color change.       Objective:   Physical Exam AAO x3, NAD DP/PT pulses palpable bilaterally, CRT less than 3 seconds Protective sensation intact with Simms Weinstein monofilament, vibratory sensation intact, Achilles tendon reflex intact There is evidence of incurvation of both the medial and lateral nail borders of the right hallux toenail with tenderness to palpation overlying this area. There is erythema to the distal aspect of the hallux from the level IPJ distally. There is purulence expressed from both the medial and lateral nail borders. There is no ascending cellulitis, fluctuance, crepitus, malodor. Remaining nails without pathology. No other areas of tenderness to bilateral lower extremities. MMT 5/5, ROM WNL.  No open lesions or pre-ulcerative lesions.  No overlying edema, erythema, increase in warmth to bilateral lower extremities.  No pain with calf compression, swelling, warmth, erythema bilaterally.      Assessment & Plan:  74 year old male right hallux paronychia -Treatment options were discussed including alternatives, risks, complications. -At  this time, recommended partial nail removal without chemical matricectomy to the medial and lateral hallux nail borders due to infection. Risks and complications were discussed with the patient for which they understand and  verbally consent to the procedure. Under sterile conditions a total of 3 mL of a mixture of 2% lidocaine plain and 0.5% Marcaine plain was infiltrated in a hallux block fashion. Once anesthetized, the skin was prepped in sterile fashion. A tourniquet was then applied. Next the symptomatic borders of the hallux nail border was sharply excised making sure to remove the entire offending nail border. A small amount of purulence was expressed. Once the nail was removed, the area was debrided and the underlying skin was intact. The area was irrigated and hemostasis was obtained. No further purulence was identified.  A dry sterile dressing was applied and Surgicel and a compression dressing was applied for hemostasis. After a period of time the bandage was removed and there was no active bleeding identified and Silvadene was applied followed by a dry sterile dressing. After application of the dressing the tourniquet was removed and there is found to be an immediate capillary refill time to the digit. The patient tolerated the procedure well any complications. Post procedure instructions were discussed the patient for which he verbally understood. Follow-up in one week for nail check or sooner if any problems are to arise. Discussed signs/symptoms of worsening infection and directed to call the office immediately should any occur or go directly to the emergency room. In the meantime, encouraged to call the office with any questions, concerns, changes symptoms. -Finish Bactrim.

## 2015-05-11 ENCOUNTER — Ambulatory Visit: Payer: Medicare Other | Admitting: Podiatry

## 2015-05-11 ENCOUNTER — Encounter: Payer: Self-pay | Admitting: Podiatry

## 2015-05-15 ENCOUNTER — Ambulatory Visit (INDEPENDENT_AMBULATORY_CARE_PROVIDER_SITE_OTHER): Payer: Medicare Other | Admitting: Podiatry

## 2015-05-15 ENCOUNTER — Encounter: Payer: Self-pay | Admitting: Podiatry

## 2015-05-15 VITALS — BP 120/60 | HR 62 | Resp 12

## 2015-05-15 DIAGNOSIS — L03011 Cellulitis of right finger: Secondary | ICD-10-CM

## 2015-05-15 DIAGNOSIS — L03031 Cellulitis of right toe: Secondary | ICD-10-CM

## 2015-05-15 MED ORDER — CEPHALEXIN 500 MG PO CAPS: 500.0000 mg | ORAL_CAPSULE | Freq: Three times a day (TID) | ORAL | Status: DC

## 2015-05-19 ENCOUNTER — Encounter: Payer: Self-pay | Admitting: Podiatry

## 2015-05-19 NOTE — Progress Notes (Signed)
Patient ID: Evan Kirk, male   DOB: May 13, 1941, 74 y.o.   MRN: [id number]  Subjective: 74 year old male presents the office they for follow-up evaluation status post right hallux partial nail avulsion due to paronychia. He states he continue the Bactrim although he does continue to have some redness and pain to the nail borders. He denies any drainage or purulence. His been continuous soaking in Epson salt soaks twice a day followed by antibiotic ointment and a Band-Aid. He denies any red streaks. Denies any systemic complaints such as fevers, chills, nausea, vomiting. Denies any calf pain, chest pain, soreness of breath. No other complaints at this time.  Objective: AAO 3, NAD neurovascular status unchanged Status post right hallux medial and lateral partial nail avulsions. There is a small amount of granulation tissue and the scab formed within the procedure sites. There is still residual erythema around the nail orders without any ascending cellulitis. There is no areas of questions or crepitus. There is no drainage or purulence. There is mild tenderness palpation along the procedure sites. No malodor. Remaining nails without pathology. No other areas of tenderness to bilateral lower extremity is. No other areas of edema, erythema, increased warmth. No other open lesions or pre-ulcerative lesions are identified bilaterally. No pain with calf compression, swelling, warmth, erythema.  Assessment: 74 year old male 1 week status post bilateral medial/lateral hallux partial nail avulsion due to paronychia with residual erythema  Plan: -Treatment options discussed including all alternatives, risks, and complications -At this time we'll change from Bactrim to Keflex. -Continue soaking in Epson salt soaks twice a day followed by antibiotic ointment and a Band-Aid. Once the infection starts to resolve can leave the area uncovered at night. -Follow-up in 2 weeks or sooner if any problems are to  arise. Monitor closely for any signs or symptoms of worsening infection and directed to call the office immediately should any occur or go to the ER. In the meantime call the office with any questions, concerns, change in symptoms.

## 2015-05-29 ENCOUNTER — Ambulatory Visit (INDEPENDENT_AMBULATORY_CARE_PROVIDER_SITE_OTHER): Payer: Medicare Other | Admitting: Podiatry

## 2015-05-29 VITALS — BP 148/71 | HR 80 | Resp 17

## 2015-05-29 DIAGNOSIS — Z9889 Other specified postprocedural states: Secondary | ICD-10-CM

## 2015-05-29 DIAGNOSIS — L03011 Cellulitis of right finger: Secondary | ICD-10-CM

## 2015-05-29 DIAGNOSIS — L03031 Cellulitis of right toe: Secondary | ICD-10-CM

## 2015-05-29 NOTE — Patient Instructions (Signed)

## 2015-06-03 ENCOUNTER — Encounter: Payer: Self-pay | Admitting: Podiatry

## 2015-06-03 NOTE — Progress Notes (Signed)
Patient ID: Evan Kirk, male   DOB: 03/09/1941, 74 y.o.   MRN: [id number]  Subjective: 74 year old male presents the office they for follow-up evaluation status post right hallux partial nail avulsion due to paronychia. He states that he has recently finished his course of antibiotic. He states that he feels that the area has improved significantly compared to last appointment. He says the redness has decreased knee has no pain to the area. Denies any drainage or purulence. He's been soaking his foot in Epson salt soaks twice a day covering with antibiotic ointment and a Band-Aid. He denies any red streaking. Denies any systemic complaints such as fevers, chills, nausea, vomiting. Denies any calf pain, chest pain, soreness of breath. No other complaints at this time.  Objective: AAO 3, NAD neurovascular status unchanged Status post right hallux medial and lateral partial nail avulsions. There is a small amount of granulation tissue and the scab formed within the procedure site. There is decreased erythema around the nail borders without any ascending cellulitis. There is no areas of fluctuance or crepitus. There is no drainage or purulence. There is no tenderness palpation along the procedure sites. No malodor. Remaining nails without pathology. No other areas of tenderness to bilateral lower extremities.  No other areas of edema, erythema, increased warmth.  No other open lesions or pre-ulcerative lesions are identified bilaterally.  No pain with calf compression, swelling, warmth, erythema.  Assessment: 74 year old male 3 week status post bilateral medial/lateral hallux partial nail avulsion due to paronychia with resolving infection.   Plan: -Treatment options discussed including all alternatives, risks, and complications -Continue soaking in Epson salt soaks twice a day followed by antibiotic ointment and a Band-Aid. Can leave the area uncovered at night. Continue this until the area is  completely healed.  -Follow-up in 2 weeks if the area is not completely healed or sooner if any problems are to arise. Monitor closely for any signs or symptoms of worsening infection and directed to call the office immediately should any occur or go to the ER. In the meantime call the office with any questions, concerns, change in symptoms.

## 2015-06-19 DIAGNOSIS — G250 Essential tremor: Secondary | ICD-10-CM

## 2015-06-19 HISTORY — DX: Essential tremor: G25.0

## 2015-07-10 ENCOUNTER — Other Ambulatory Visit (INDEPENDENT_AMBULATORY_CARE_PROVIDER_SITE_OTHER): Payer: Medicare Other

## 2015-07-10 ENCOUNTER — Encounter: Payer: Self-pay | Admitting: Neurology

## 2015-07-10 ENCOUNTER — Ambulatory Visit (INDEPENDENT_AMBULATORY_CARE_PROVIDER_SITE_OTHER): Payer: Medicare Other | Admitting: Neurology

## 2015-07-10 VITALS — BP 132/60 | HR 77 | Ht 67.0 in | Wt 285.0 lb

## 2015-07-10 DIAGNOSIS — R251 Tremor, unspecified: Secondary | ICD-10-CM

## 2015-07-10 DIAGNOSIS — G629 Polyneuropathy, unspecified: Secondary | ICD-10-CM

## 2015-07-10 DIAGNOSIS — Z79899 Other long term (current) drug therapy: Secondary | ICD-10-CM

## 2015-07-10 DIAGNOSIS — R7309 Other abnormal glucose: Secondary | ICD-10-CM

## 2015-07-10 DIAGNOSIS — R7303 Prediabetes: Secondary | ICD-10-CM

## 2015-07-10 DIAGNOSIS — R7301 Impaired fasting glucose: Secondary | ICD-10-CM

## 2015-07-10 LAB — RPR

## 2015-07-10 LAB — VITAMIN B12: VITAMIN B 12: 369 pg/mL (ref 211–911)

## 2015-07-10 LAB — FOLATE: Folate: >24.8 ng/mL (ref >5.9)

## 2015-07-10 LAB — HEMOGLOBIN A1C: HEMOGLOBIN A1C: 6.1 % (ref 4.6–6.5)

## 2015-07-10 LAB — TSH: TSH: 1.49 u[IU]/mL (ref 0.35–4.50)

## 2015-07-10 NOTE — Patient Instructions (Signed)
Your provider has requested that you have labwork completed today. Please go to [facility] on the second floor of this building before leaving the office today. You do not need to check in. If you are not called within 15 minutes please check with the front desk.

## 2015-07-10 NOTE — Progress Notes (Signed)
Evan Kirk was seen today in the movement disorders clinic for neurologic consultation at the request of  Melinda Crutch, MD.  The consultation is for the evaluation of tremor and possible PD.  He was previously seen at the [facility] for the same.  No records are available from them.  This patient is accompanied in the office by his spouse who supplements the history.  Tremor started in 2010 and it started in the head.  Tremor is generally in the "yes" direction according to the patient but pt states that it is in both the "yes" and "no" direction.  Not long after it started in the head, it started in the hands.  He can stop it for a short period of time if he tries.  The L hand shakes more than the right.  It only seems to shake at rest.  He will notice some leg tremor when driving and notices it bilaterally.  No fam hx of tremor or PD.      Tremor: Yes.     Affected by caffeine:  No. (one 12 oz glass of coke per day, max)  Affected by alcohol:  Doesn't drink alcohol  Affected by stress:  unknown  Affected by fatigue:  Yes.    Spills soup if on spoon: spills things but not sure if because of tremor  Spills glass of liquid if full:  Spills but not sure if because of tremor  Specific Symptoms: Voice: no change, "always been loud" Sleep: takes 3 mg melatonin and helps  Vivid Dreams:  Yes.   (but has a hx of ptsd)  Acting out dreams:  No. Wet Pillows: Yes.   (but wears cpap mask with heated humidifier) Postural symptoms:  Yes.    Falls?  Yes.   (last fall 3 months ago - during night went to get up to go to the bathroom and got dizzy) Bradykinesia symptoms: difficulty getting out of a chair, difficulty regaining balance and walks "like a drunk" Loss of smell:  Yes.   (attributes to [place] exposures) Loss of taste:  Yes.   Urinary Incontinence:  Yes.   (only recently as had hematuria and then put up large bladder cath and then had resultant incontinence.  Seeing urology  now) Difficulty Swallowing:  No. Handwriting, micrographia: no as legible but not necessarily tiny Trouble with ADL's:  Yes.   (minor trouble - sits to put on pants)  Trouble buttoning clothing: No. (not unless small) Depression:  Yes.   (PTSD) Memory changes:  Yes.   (some short term) Hallucinations:  No.  visual distortions: Yes.   N/V:  No. Lightheaded:  Yes.    Syncope: No. Diplopia:  No.  (very rarely) Dyskinesia:  No.  Neuroimaging has previously been performed.  It is not available for my review today.  There was apparently an MRI brain done at the [facility].  PREVIOUS MEDICATIONS: none to date  ALLERGIES:   Allergies  Allergen Reactions  . Diltiazem Other (See Comments)    Pt didn't feel well     CURRENT MEDICATIONS:  Outpatient Encounter Prescriptions as of 07/10/2015  Medication Sig  . acetaminophen (TYLENOL) 500 MG tablet Take 500 mg by mouth every 6 (six) hours as needed.  . benzoyl peroxide 5 % gel Apply topically daily.  . carboxymethylcellulose (REFRESH PLUS) 0.5 % SOLN 1 drop 3 (three) times daily as needed.  . cholecalciferol (VITAMIN D) 1000 UNITS tablet Take 2,000 Units by mouth  daily.  . docusate sodium (COLACE) 100 MG capsule Take 100 mg by mouth 2 (two) times daily.  . fluocinonide (LIDEX) 0.05 % external solution Apply 1 application topically 2 (two) times daily.  . hydrocortisone 2.5 % lotion Apply topically 2 (two) times daily.  Marland Kitchen ketoconazole (NIZORAL) 2 % shampoo Apply 1 application topically 2 (two) times a week.  . loratadine (CLARITIN) 10 MG tablet Take 10 mg by mouth daily.  . metoprolol (LOPRESSOR) 50 MG tablet Take 0.5 tablets (25 mg total) by mouth 2 (two) times daily.  . Multiple Vitamin (MULTIVITAMIN) capsule Take 1 capsule by mouth daily.  Marland Kitchen spironolactone (ALDACTONE) 25 MG tablet Take 1 tablet (25 mg total) by mouth 2 (two) times daily.  Marland Kitchen terazosin (HYTRIN) 5 MG capsule Take 5 mg by mouth at bedtime.  . traMADol (ULTRAM) 50 MG  tablet Take by mouth every 6 (six) hours as needed.  . traZODone (DESYREL) 100 MG tablet Take 100 mg by mouth at bedtime.  Marland Kitchen warfarin (COUMADIN) 5 MG tablet Take 5 mg by mouth as directed. M/w/f/ 7.5 mg then t/th/sa/sun is 5 mg daily by mouth  . [DISCONTINUED] cephALEXin (KEFLEX) 500 MG capsule Take 1 capsule (500 mg total) by mouth 3 (three) times daily.   No facility-administered encounter medications on file as of 07/10/2015.    PAST MEDICAL HISTORY:   Past Medical History  Diagnosis Date  . HTN (hypertension)     pt denies  . Back pain   . Atrial fibrillation   . Obesity   . Heart failure   . Edema   . Diverticulitis of colon (without mention of hemorrhage)   . OSA (obstructive sleep apnea)     CPAP dependent    PAST SURGICAL HISTORY:   Past Surgical History  Procedure Laterality Date  . Knee surgery Right   . Vasectomy  1977  . Vasectomy reversal  1978    SOCIAL HISTORY:   History   Social History  . Marital Status: Married    Spouse Name: N/A  . Number of Children: N/A  . Years of Education: N/A   Occupational History  . retired     Therapist, art x 5 years; Water engineer   Social History Main Topics  . Smoking status: Former Smoker    Quit date: 04/18/1966  . Smokeless tobacco: Not on file     Comment: quit 1967  . Alcohol Use: No     Comment: none since 1968  . Drug Use: No  . Sexual Activity: Not on file   Other Topics Concern  . Not on file   Social History Narrative    FAMILY HISTORY:   Family Status  Relation Status Death Age  . Mother Deceased     stroke, ilitis, sepsis  . Father Deceased     leukemia, prostate cancer  . Brother Alive     unknown  . Brother Alive     intestinal problem (colostomy/ileostomy)  . Brother Alive     diabetes  . Son Alive     healthy  . Son Alive     healthy  . Son Alive     healthy  . Son Alive     healthy  . Son Alive     healthy    ROS:  A complete 10 system review of systems was obtained and was  unremarkable apart from what is mentioned above.  PHYSICAL EXAMINATION:    VITALS:   Filed Vitals:   07/10/15 4656  BP: 132/60  Pulse: 77  Height: 5\' 7"  (1.702 m)  Weight: 285 lb (129.275 kg)    GEN:  The patient appears stated age and is in NAD. HEENT:  Normocephalic, atraumatic.  The mucous membranes are moist. The superficial temporal arteries are without ropiness or tenderness. CV:  Irreg irreg Lungs:  CTAB but DOE and even some conversational dyspnea Neck/HEME:  There are no carotid bruits bilaterally.  Neurological examination:  Orientation: The patient is alert and oriented x3. Fund of knowledge is appropriate.  Recent and remote memory are intact.  Attention and concentration are normal.    Able to name objects and repeat phrases. Cranial nerves: There is good facial symmetry. Pupils are equal round and reactive to light bilaterally. Fundoscopic exam reveals clear margins bilaterally. Extraocular muscles are intact. The visual fields are full to confrontational testing. The speech is fluent and clear. Soft palate rises symmetrically and there is no tongue deviation. Hearing is intact to conversational tone. Sensation: Sensation is intact to light and pinprick throughout (facial, trunk, extremities). Vibration is markedly decreased distally being nearly absent at the knees. There is no extinction with double simultaneous stimulation. There is no sensory dermatomal level identified. Motor: Strength is 5/5 in the bilateral upper and lower extremities.   Shoulder shrug is equal and symmetric.  There is no pronator drift. Deep tendon reflexes: Deep tendon reflexes are 1/4 at the bilateral biceps, triceps, brachioradialis, absent at the bilateral patella and achilles. Plantar responses are downgoing bilaterally.  Movement examination: Tone: There is normal tone in the bilateral upper extremities.  The tone in the lower extremities is normal.  Abnormal movements: none.  There is minimal  head tremor that I saw just momentary in the "yes" direction.  I did not see any hand tremor.  Has minimal trouble pouring water from one glass to another.  No trouble with archimedes spirals. Coordination:  There is no decremation with RAM's, with any form of RAMS, including alternating supination and pronation of the forearm, hand opening and closing, finger taps, heel taps and toe taps.   Gait and Station: The patient has difficulty arising out of a deep-seated chair without the use of the hands (primarily because of weight). The patient's stride length is normal but he is wide based.  He cannot ambulate in a tandem fashion.  He is able to stand in the Romberg position with eyes open, but not with eyes closed.  Labs: Hemoglobin A1c in January was 6.2  ASSESSMENT/PLAN:  1.  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  I really saw very little, if any, tremor today.  Most importantly, I stressed to the patient that he had no evidence of a neurodegenerative tremor such as Parkinson's disease.  We talked about various medications, but in the end I told him that I did not recommend any medication for him today.  -We will check his TSH.  -The patient was to try and get a copy of [place] records for me. 2.  Gait instability and strong evidence of a significant peripheral neuropathy  -I wonder if this is not from diabetes.  Many times, the diagnosis of peripheral neuropathy can preceed the diagnosis of diabetes.  His hemoglobin A1c in January was 6.2.  I will recheck his hemoglobin A1c as well as check for other reversible causes of peripheral neuropathy, including B12, folate, RPR, SPEP/UPEP with immunofixation.  We talked about proper diet/exercise.  -We discussed safety associated with  peripheral neuropathy.  Much greater than 50% of the 60 minute visit was spent in counseling with the patient and his spouse. 3.  He will follow up with me on an as-needed  basis.

## 2015-07-13 ENCOUNTER — Ambulatory Visit (HOSPITAL_COMMUNITY)
Admission: RE | Admit: 2015-07-13 | Discharge: 2015-07-13 | Disposition: A | Discharge status: Home / Self Care | Payer: Medicare Other | Source: Ambulatory Visit | Attending: Urology | Admitting: Urology

## 2015-07-13 ENCOUNTER — Telehealth: Payer: Self-pay | Admitting: Neurology

## 2015-07-13 ENCOUNTER — Other Ambulatory Visit (HOSPITAL_COMMUNITY): Payer: Self-pay | Admitting: Urology

## 2015-07-13 DIAGNOSIS — R0602 Shortness of breath: Secondary | ICD-10-CM | POA: Diagnosis present

## 2015-07-13 DIAGNOSIS — Z87891 Personal history of nicotine dependence: Secondary | ICD-10-CM | POA: Insufficient documentation

## 2015-07-13 DIAGNOSIS — N360 Urethral fistula: Secondary | ICD-10-CM | POA: Diagnosis not present

## 2015-07-13 DIAGNOSIS — I10 Essential (primary) hypertension: Secondary | ICD-10-CM | POA: Diagnosis not present

## 2015-07-13 DIAGNOSIS — N2889 Other specified disorders of kidney and ureter: Secondary | ICD-10-CM

## 2015-07-13 DIAGNOSIS — D49519 Neoplasm of unspecified behavior of unspecified kidney: Secondary | ICD-10-CM

## 2015-07-13 NOTE — Telephone Encounter (Signed)
-----   Message from [place], DO sent at 07/13/2015  8:52 AM EDT ----- Please let pt know that B12 just hair low (would like to see over 400) and to start 1063mcg B12 OTC daily.  Remains in the pre-diabetic level and needs to watch blood sugars.  Think that he said that had appt with nutritionist.

## 2015-07-13 NOTE — Telephone Encounter (Signed)
Patient's wife made aware.

## 2015-07-13 NOTE — Telephone Encounter (Signed)
Left message on machine for patient to call back.

## 2015-07-14 LAB — SPEP & IFE WITH QIG
ALBUMIN ELP: 4 g/dL (ref 3.8–4.8)
ALPHA-1-GLOBULIN: 0.4 g/dL — AB (ref 0.2–0.3)
Alpha-2-Globulin: 0.9 g/dL (ref 0.5–0.9)
Beta 2: 0.4 g/dL (ref 0.2–0.5)
Beta Globulin: 0.5 g/dL (ref 0.4–0.6)
Gamma Globulin: 1.4 g/dL (ref 0.8–1.7)
IGA: 132 mg/dL (ref 68–379)
IGM, SERUM: 95 mg/dL (ref 41–251)
IgG (Immunoglobin G), Serum: 1490 mg/dL (ref 650–1600)
Total Protein, Serum Electrophoresis: 7.6 g/dL (ref 6.1–8.1)

## 2015-07-14 LAB — UIFE/LIGHT CHAINS/TP QN, 24-HR UR
Albumin, U: DETECTED
Alpha 1, Urine: DETECTED — AB
Alpha 2, Urine: DETECTED — AB
BETA UR: DETECTED — AB
GAMMA UR: DETECTED — AB
TOTAL PROTEIN, URINE-UPE24: 17 mg/dL (ref 5–25)

## 2015-07-15 ENCOUNTER — Telehealth: Payer: Self-pay | Admitting: Oncology

## 2015-07-15 NOTE — Telephone Encounter (Signed)
NEW PATIENT APPT-S/W PATIENT WIFE SANDRA AND GAVE NP APPT FOR 07/29 @ 10:30 W/DR, Alen Blew REFERRING  DR. PATRICK MCKENIZE DX- RENAL MASS   REFERRAL SCANNED

## 2015-07-17 ENCOUNTER — Ambulatory Visit (HOSPITAL_BASED_OUTPATIENT_CLINIC_OR_DEPARTMENT_OTHER): Payer: Medicare Other | Admitting: Oncology

## 2015-07-17 ENCOUNTER — Telehealth: Payer: Self-pay | Admitting: Oncology

## 2015-07-17 ENCOUNTER — Ambulatory Visit: Payer: Medicare Other

## 2015-07-17 ENCOUNTER — Encounter: Payer: Self-pay | Admitting: Oncology

## 2015-07-17 VITALS — BP 149/75 | HR 73 | Temp 97.7°F | Resp 18 | Ht 67.0 in | Wt 287.5 lb

## 2015-07-17 DIAGNOSIS — N2889 Other specified disorders of kidney and ureter: Secondary | ICD-10-CM

## 2015-07-17 DIAGNOSIS — D494 Neoplasm of unspecified behavior of bladder: Secondary | ICD-10-CM | POA: Diagnosis not present

## 2015-07-17 DIAGNOSIS — D49519 Neoplasm of unspecified behavior of unspecified kidney: Secondary | ICD-10-CM

## 2015-07-17 NOTE — Progress Notes (Signed)
Please see consult note.  

## 2015-07-17 NOTE — Progress Notes (Signed)
Checked in new pt with no financial concerns prior to seeing the dr.  [name] has my card and Shauna's contact info for any billing questions, concerns or if financial assistance is needed.

## 2015-07-17 NOTE — Consult Note (Signed)
Reason for Referral:  Kidney mass.  HPI:  This is a pleasant 74 year old gentleman currently of [place] where he lives majority of his life. He also served in the [employer] during [place] war and endorses [employer] during that period of time. He has a history of hypertension, atrial fibrillation and obesity. He presented acutely with gross hematuria on 06/24/2015.  He was referred to Dr. Alyson Ingles at [facility] urology. 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 He also had a CT scan on 07/06/2015 which showed 89.6 x 8.2 x 8.8 cm mass in the posterior aspect of the left kidney. The lesion extends posteriorly Webb Silversmith comes in contact with the ribs without definite chest wall involvement. There is a potential filling defect in the proximal aspect of the left renal vein. There is no clear evidence of lymphadenopathy or abdominal metastasis. Chest x-ray on 07/13/2015 did not show any clear-cut abnormalities. He initially had a catheter placed for urinary drainage and that has been removed subsequently. He is urinating freely at this time without any hematuria. Clinically, he reports a flank pain and back pain associated with this mass but seems to be manageable. He does not take pain medication regularly.  He does not report any hematuria or dysuria at this time. Does not report any constitutional symptoms of weight loss or appetite changes. He does report symptoms of dizziness and lightheadedness at times. He also had tremors abdomen diagnosis of essential tremors without evidence of any other neurological disorders.   He does not report any headaches, blurry vision syncope or seizures. He does not report any fevers, chills, sweats weight loss or appetite changes. He does not report any  Chest pain, palpitation, orthopnea or leg edema. He does report dyspnea on exertion and sleep apnea. He does not report any cough, hemoptysis or hematemesis. Does not  report any wheezing or shortness of breath at rest. He does not report any nausea, vomiting, abdominal pain, satiety. He does report flank pain and burning pain. He does not report any neurological deficits or problem with ambulation. He does not report any frequency urgency or hesitancy. Does not report any other skeletal complaints. Remaining review of systems unremarkable.       Past Medical History  Diagnosis Date  . HTN (hypertension)     pt denies  . Back pain   . Atrial fibrillation   . Obesity   . Heart failure   . Edema   . Diverticulitis of colon (without mention of hemorrhage)   . OSA (obstructive sleep apnea)     CPAP dependent  :  Past Surgical History  Procedure Laterality Date  . Knee surgery Right   . Vasectomy  1977  . Vasectomy reversal  1978  :   Current outpatient prescriptions:  .  acetaminophen (TYLENOL) 500 MG tablet, Take 500 mg by mouth every 6 (six) hours as needed., Disp: , Rfl:  .  benzoyl peroxide 5 % gel, Apply topically daily., Disp: , Rfl:  .  carboxymethylcellulose (REFRESH PLUS) 0.5 % SOLN, 1 drop 3 (three) times daily as needed., Disp: , Rfl:  .  cholecalciferol (VITAMIN D) 1000 UNITS tablet, Take 2,000 Units by mouth daily., Disp: , Rfl:  .  docusate sodium (COLACE) 100 MG capsule, Take 100 mg by mouth 2 (two) times daily., Disp: , Rfl:  .  fluocinonide (LIDEX) 0.05 % external solution, Apply 1 application topically 2 (two) times daily., Disp: ,  Rfl:  .  hydrocortisone 2.5 % lotion, Apply topically 2 (two) times daily., Disp: , Rfl:  .  ketoconazole (NIZORAL) 2 % shampoo, Apply 1 application topically 2 (two) times a week., Disp: , Rfl:  .  loratadine (CLARITIN) 10 MG tablet, Take 10 mg by mouth daily., Disp: , Rfl:  .  metoprolol (LOPRESSOR) 50 MG tablet, Take 0.5 tablets (25 mg total) by mouth 2 (two) times daily., Disp: 30 tablet, Rfl: 6 .  Multiple Vitamin (MULTIVITAMIN) capsule, Take 1 capsule by mouth daily., Disp: , Rfl:  .   spironolactone (ALDACTONE) 25 MG tablet, Take 1 tablet (25 mg total) by mouth 2 (two) times daily., Disp: , Rfl:  .  terazosin (HYTRIN) 5 MG capsule, Take 5 mg by mouth at bedtime., Disp: , Rfl:  .  traMADol (ULTRAM) 50 MG tablet, Take by mouth every 6 (six) hours as needed., Disp: , Rfl:  .  traZODone (DESYREL) 100 MG tablet, Take 100 mg by mouth at bedtime., Disp: , Rfl:  .  warfarin (COUMADIN) 5 MG tablet, Take 5 mg by mouth as directed. M/w/f/ 7.5 mg then t/th/sa/sun is 5 mg daily by mouth or titrated as directed, Disp: , Rfl: :  Allergies  Allergen Reactions  . Diltiazem Other (See Comments)    Pt didn't feel well   :  Family History  Problem Relation Age of Onset  . Stroke Mother   :  History   Social History  . Marital Status: Married    Spouse Name: N/A  . Number of Children: N/A  . Years of Education: N/A   Occupational History  . retired     Therapist, art x 5 years; Water engineer   Social History Main Topics  . Smoking status: Former Smoker    Quit date: 04/18/1966  . Smokeless tobacco: Not on file     Comment: quit 1967  . Alcohol Use: No     Comment: none since 1968  . Drug Use: No  . Sexual Activity: Not on file   Other Topics Concern  . Not on file   Social History Narrative  :  Pertinent items are noted in HPI.  Exam: Blood pressure 149/75, pulse 73, temperature 97.7 F (36.5 C), temperature source Oral, resp. rate 18, height 5\' 7"  (1.702 m), weight 287 lb 8 oz (130.409 kg), SpO2 94 %. General appearance: alert and cooperative Head: Normocephalic, without obvious abnormality Throat: lips, mucosa, and tongue normal; teeth and gums normal Neck: no adenopathy Back: negative Resp: clear to auscultation bilaterally Chest wall: no tenderness Cardio: regular rate and rhythm, S1, S2 normal, no murmur, click, rub or gallop GI: soft, non-tender; bowel sounds normal; no masses,  no organomegaly Extremities: extremities normal, atraumatic, no cyanosis or  edema Pulses: 2+ and symmetric Skin: Skin color, texture, turgor normal. No rashes or lesions Lymph nodes: Cervical, supraclavicular, and axillary nodes normal.  CBC    Component Value Date/Time   WBC 9.0 11/13/2008 0447   RBC 3.53* 11/13/2008 0447   HGB 11.4* 11/13/2008 0447   HCT 32.9* 11/13/2008 0447   PLT 286 11/13/2008 0447   MCV 93.3 11/13/2008 0447   MCHC 34.7 11/13/2008 0447   RDW 13.3 11/13/2008 0447      Chemistry      Component Value Date/Time   NA 136 01/20/2015 0913   K 4.1 01/20/2015 0913   CL 104 01/20/2015 0913   CO2 26 01/20/2015 0913   BUN 14 01/20/2015 0913   CREATININE 0.96 01/20/2015  0913      Component Value Date/Time   CALCIUM 9.8 01/20/2015 0913   ALKPHOS 67 10/30/2008 1431   AST 25 10/30/2008 1431   ALT 25 10/30/2008 1431   BILITOT 1.0 10/30/2008 1431      Dg Chest 2 View  07/13/2015   CLINICAL DATA:  O urethral fistula. Shortness of breath, hypertension, former smoker.  EXAM: CHEST  2 VIEW  COMPARISON:  10/30/2008  FINDINGS: Linear densities in the left base, likely scarring or atelectasis. Right lung is clear. Heart is normal size. No effusions or acute bony abnormality.  IMPRESSION: Left basilar scarring or atelectasis.   Electronically Signed   By: Rolm Baptise M.D.   On: 07/13/2015 11:03    Assessment and Plan:    74 year old gentleman with the following issues :  1. A renal mass measuring 9.6 x 8.2 x 8.8 cm arising of the left kidney suspicious for kidney cancer. The tumor appears to be contained within the Gerota's fascia with possible filling defect in the proximal left renal vein. There some borderline enlarged retroperitoneal lymph nodes measuring up to 9 mm. There is no evidence of obvious abdominal metastasis. His chest x-ray was also clear.   The differential diagnosis was discussed with the patient and his wife. 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 I agree with Dr. Alyson Ingles that a radical  nephrectomy his the way to go at this time. I do not see any role for a biopsy, no adjuvant therapy at this time. Even  If we are dealing with a locally advanced tumor, it is very reasonable to proceed with a cytoreductive nephrectomy given the size of this tumor and the fact that he is experiencing pain already.  He is scheduled an MRI for better visualization of this tumor.   After his operation, I will evaluate the pathology and discuss whether systemic therapy is needed at that time.   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 He is planning to have TURBT at the time of nephrectomy for better visualization and quantification. Further recommendation regarding this tumor pending the final pathology.    3. Follow-up: Will be after his operation to discuss the pathology and next steps of treatment.

## 2015-07-17 NOTE — Telephone Encounter (Signed)
Gave and printed appt sched adna vs for pt for OCT °

## 2015-07-20 ENCOUNTER — Ambulatory Visit (HOSPITAL_COMMUNITY)
Admission: RE | Admit: 2015-07-20 | Discharge: 2015-07-20 | Disposition: A | Discharge status: Home / Self Care | Payer: Medicare Other | Source: Ambulatory Visit | Attending: Urology | Admitting: Urology

## 2015-07-20 ENCOUNTER — Other Ambulatory Visit (HOSPITAL_COMMUNITY): Payer: Self-pay | Admitting: Urology

## 2015-07-20 DIAGNOSIS — N2889 Other specified disorders of kidney and ureter: Secondary | ICD-10-CM | POA: Insufficient documentation

## 2015-07-20 DIAGNOSIS — D49519 Neoplasm of unspecified behavior of unspecified kidney: Secondary | ICD-10-CM

## 2015-07-20 DIAGNOSIS — K8020 Calculus of gallbladder without cholecystitis without obstruction: Secondary | ICD-10-CM | POA: Insufficient documentation

## 2015-07-20 LAB — CREATININE, SERUM
CREATININE: 1.3 mg/dL — AB (ref 0.61–1.24)
GFR calc non Af Amer: 53 mL/min — ABNORMAL LOW (ref >60)

## 2015-07-20 MED ORDER — GADOBENATE DIMEGLUMINE 529 MG/ML IV SOLN
20.0000 mL | Freq: Once | INTRAVENOUS | Status: AC | PRN
  Administered 2015-07-20: 20 mL via INTRAVENOUS

## 2015-08-05 ENCOUNTER — Telehealth: Payer: Self-pay | Admitting: *Deleted

## 2015-08-05 NOTE — Telephone Encounter (Signed)
Patients wife called to speak with the nurse about his most recent office visit  Katharine Look [phone]

## 2015-08-05 NOTE — Telephone Encounter (Signed)
Left message on machine for patient to call back.

## 2015-08-05 NOTE — Telephone Encounter (Signed)
Spoke with patient's wife. She wants his note addended to send back to the [place] stating that the patient has peripheral neuropathy. I advised that the note that was already sent to them does state this. She states that [place] says this is not clear enough. I advised if the [place] had a form for [place] to fill out that we could do that but I could not change Dr Doristine Devoid note. She will check with them and call back if needed.

## 2015-08-06 NOTE — Telephone Encounter (Signed)
I'm not sure how to make it more clear.  I put "strong evidence of peripheral neuropathy"

## 2015-08-10 ENCOUNTER — Other Ambulatory Visit (HOSPITAL_COMMUNITY): Payer: Self-pay | Admitting: Urology

## 2015-08-10 DIAGNOSIS — Z01810 Encounter for preprocedural cardiovascular examination: Secondary | ICD-10-CM

## 2015-08-11 ENCOUNTER — Telehealth: Payer: Self-pay | Admitting: Interventional Cardiology

## 2015-08-11 NOTE — Telephone Encounter (Signed)
I was able to copy the echo done 08/10/15 and other documents except for the letter for surgical clearance that Dr. Lauro Regulus states Dr. Tamala Julian has written. It needs to read: " Okay for pt to have the procedure and to hold Coumadin prior the removal of a large kidney mass" these documents need  to fax today.

## 2015-08-11 NOTE — Telephone Encounter (Signed)
New message      Dr Lauro Regulus request to talk to Dr Thompson Caul nurse

## 2015-08-11 NOTE — Telephone Encounter (Signed)
Dr Lauro Regulus from [facility] called because he needs the letter that Dr. Tamala Julian had written on pt " that is okay for pt to precede with  surgery of large kidney Mass and to stop coumadin.  Surgery is scheduled for Thursday the 25 th. He needs also the last echo, EKG,Stress test.and the echo scheduled for tomorrow when it gets done. All this records need to be fax today except for the echo scheduled for tomorrow, which needs to be fax tomorrow after it is read. ATT: Dr Lauro Regulus and Dr. Sherral Hammers to fax # [phone].

## 2015-08-12 ENCOUNTER — Telehealth (HOSPITAL_COMMUNITY): Payer: Self-pay | Admitting: *Deleted

## 2015-08-12 ENCOUNTER — Ambulatory Visit (HOSPITAL_COMMUNITY): Payer: Medicare Other | Attending: Cardiology

## 2015-08-12 ENCOUNTER — Other Ambulatory Visit: Payer: Self-pay

## 2015-08-12 DIAGNOSIS — I059 Rheumatic mitral valve disease, unspecified: Secondary | ICD-10-CM | POA: Insufficient documentation

## 2015-08-12 DIAGNOSIS — I517 Cardiomegaly: Secondary | ICD-10-CM | POA: Diagnosis not present

## 2015-08-12 DIAGNOSIS — Z01818 Encounter for other preprocedural examination: Secondary | ICD-10-CM | POA: Insufficient documentation

## 2015-08-12 DIAGNOSIS — Z01810 Encounter for preprocedural cardiovascular examination: Secondary | ICD-10-CM | POA: Diagnosis not present

## 2015-08-12 HISTORY — PX: TRANSTHORACIC ECHOCARDIOGRAM: SHX275

## 2015-08-12 NOTE — Telephone Encounter (Signed)
Cardiac clearance 

## 2015-08-12 NOTE — Telephone Encounter (Signed)
Cardiac clearance from Dr.Smith and echo performed on 08/12/15. Faxed to [facility] attn: Dr.McBride fax @ [phone]

## 2015-08-12 NOTE — Telephone Encounter (Signed)
Letter to Dr. Lauro Regulus:  Re: Evan Kirk  To whom it may concern,  Mr. Evan Kirk. Evan Kirk is cleared to proceed with the urologic procedure. It will be safe to hold coumadin 4-5 days prior to surgery and resume as soon as safe post procedure.   Evan Labrador, III, MD

## 2015-08-13 HISTORY — PX: NEPHRECTOMY RADICAL: SUR878

## 2015-09-02 ENCOUNTER — Telehealth: Payer: Self-pay | Admitting: Podiatry

## 2015-09-02 NOTE — Telephone Encounter (Signed)
Left message on vm to call to schedule appt

## 2015-09-21 ENCOUNTER — Ambulatory Visit: Payer: Medicare Other | Admitting: Podiatry

## 2015-09-24 ENCOUNTER — Other Ambulatory Visit: Payer: Self-pay | Admitting: Urology

## 2015-10-02 ENCOUNTER — Encounter (HOSPITAL_BASED_OUTPATIENT_CLINIC_OR_DEPARTMENT_OTHER): Payer: Self-pay | Admitting: *Deleted

## 2015-10-05 ENCOUNTER — Encounter (HOSPITAL_BASED_OUTPATIENT_CLINIC_OR_DEPARTMENT_OTHER): Payer: Self-pay | Admitting: *Deleted

## 2015-10-05 NOTE — Progress Notes (Signed)
To [facility] on arrival,Ekg in chart,recent surgery at [facility] in epic-spoke with wife-instructed by Dr Alyson Ingles will not need to stop coumadin.Npo after Mn-will take metoprolol with small amt water that am,will bring cpap mask -states understands.

## 2015-10-06 ENCOUNTER — Telehealth: Payer: Self-pay | Admitting: Oncology

## 2015-10-06 NOTE — Telephone Encounter (Signed)
WIFE CALLED TO [facility] 10/20 APPOINTMENT. PER WIFE NOT RESCHEDULING PATIENT BEING SEEN IN [place].

## 2015-10-08 ENCOUNTER — Ambulatory Visit: Payer: Medicare Other | Admitting: Oncology

## 2015-10-09 ENCOUNTER — Ambulatory Visit (HOSPITAL_BASED_OUTPATIENT_CLINIC_OR_DEPARTMENT_OTHER): Payer: Medicare Other | Admitting: Certified Registered"

## 2015-10-09 ENCOUNTER — Encounter (HOSPITAL_BASED_OUTPATIENT_CLINIC_OR_DEPARTMENT_OTHER): Payer: Self-pay

## 2015-10-09 ENCOUNTER — Encounter (HOSPITAL_BASED_OUTPATIENT_CLINIC_OR_DEPARTMENT_OTHER)
Admission: RE | Disposition: A | Discharge status: Home / Self Care | Payer: Self-pay | Source: Ambulatory Visit | Attending: Urology

## 2015-10-09 ENCOUNTER — Ambulatory Visit (HOSPITAL_BASED_OUTPATIENT_CLINIC_OR_DEPARTMENT_OTHER)
Admission: RE | Admit: 2015-10-09 | Discharge: 2015-10-09 | Disposition: A | Discharge status: Home / Self Care | Payer: Medicare Other | Source: Ambulatory Visit | Attending: Urology | Admitting: Urology

## 2015-10-09 DIAGNOSIS — Z87891 Personal history of nicotine dependence: Secondary | ICD-10-CM | POA: Insufficient documentation

## 2015-10-09 DIAGNOSIS — I5032 Chronic diastolic (congestive) heart failure: Secondary | ICD-10-CM | POA: Diagnosis not present

## 2015-10-09 DIAGNOSIS — J449 Chronic obstructive pulmonary disease, unspecified: Secondary | ICD-10-CM | POA: Insufficient documentation

## 2015-10-09 DIAGNOSIS — Z79899 Other long term (current) drug therapy: Secondary | ICD-10-CM | POA: Insufficient documentation

## 2015-10-09 DIAGNOSIS — N3289 Other specified disorders of bladder: Secondary | ICD-10-CM

## 2015-10-09 DIAGNOSIS — N400 Enlarged prostate without lower urinary tract symptoms: Secondary | ICD-10-CM | POA: Insufficient documentation

## 2015-10-09 DIAGNOSIS — Z6841 Body Mass Index (BMI) 40.0 and over, adult: Secondary | ICD-10-CM | POA: Insufficient documentation

## 2015-10-09 DIAGNOSIS — Z7901 Long term (current) use of anticoagulants: Secondary | ICD-10-CM | POA: Diagnosis not present

## 2015-10-09 DIAGNOSIS — G629 Polyneuropathy, unspecified: Secondary | ICD-10-CM | POA: Insufficient documentation

## 2015-10-09 DIAGNOSIS — N3091 Cystitis, unspecified with hematuria: Secondary | ICD-10-CM | POA: Diagnosis not present

## 2015-10-09 DIAGNOSIS — R310 Gross hematuria: Secondary | ICD-10-CM | POA: Diagnosis present

## 2015-10-09 DIAGNOSIS — G250 Essential tremor: Secondary | ICD-10-CM | POA: Diagnosis not present

## 2015-10-09 DIAGNOSIS — G4733 Obstructive sleep apnea (adult) (pediatric): Secondary | ICD-10-CM | POA: Insufficient documentation

## 2015-10-09 DIAGNOSIS — I110 Hypertensive heart disease with heart failure: Secondary | ICD-10-CM | POA: Insufficient documentation

## 2015-10-09 DIAGNOSIS — Z96651 Presence of right artificial knee joint: Secondary | ICD-10-CM | POA: Insufficient documentation

## 2015-10-09 DIAGNOSIS — I482 Chronic atrial fibrillation: Secondary | ICD-10-CM | POA: Insufficient documentation

## 2015-10-09 HISTORY — DX: Essential tremor: G25.0

## 2015-10-09 HISTORY — DX: Personal history of other specified conditions: Z87.898

## 2015-10-09 HISTORY — DX: Obstructive sleep apnea (adult) (pediatric): G47.33

## 2015-10-09 HISTORY — DX: Diverticulosis of large intestine without perforation or abscess without bleeding: K57.30

## 2015-10-09 HISTORY — DX: Chronic diastolic (congestive) heart failure: I50.32

## 2015-10-09 HISTORY — DX: Other specified disorders of bladder: N32.89

## 2015-10-09 HISTORY — DX: Chronic atrial fibrillation, unspecified: I48.20

## 2015-10-09 HISTORY — DX: Polyneuropathy, unspecified: G62.9

## 2015-10-09 HISTORY — DX: Benign prostatic hyperplasia without lower urinary tract symptoms: N40.0

## 2015-10-09 HISTORY — PX: CYSTOSCOPY WITH BIOPSY: SHX5122

## 2015-10-09 HISTORY — DX: Long term (current) use of anticoagulants: Z79.01

## 2015-10-09 HISTORY — DX: Prediabetes: R73.03

## 2015-10-09 HISTORY — DX: Dependence on other enabling machines and devices: Z99.89

## 2015-10-09 LAB — POCT I-STAT 4, (NA,K, GLUC, HGB,HCT)
Glucose, Bld: 119 mg/dL — ABNORMAL HIGH (ref 65–99)
HCT: 39 % (ref 39.0–52.0)
Hemoglobin: 13.3 g/dL (ref 13.0–17.0)
Potassium: 4.1 mmol/L (ref 3.5–5.1)
SODIUM: 138 mmol/L (ref 135–145)

## 2015-10-09 SURGERY — CYSTOSCOPY, WITH BIOPSY
Anesthesia: General

## 2015-10-09 MED ORDER — TRAMADOL HCL 50 MG PO TABS
50.0000 mg | ORAL_TABLET | Freq: Once | ORAL | Status: AC
  Administered 2015-10-09: 50 mg via ORAL
  Filled 2015-10-09: qty 1

## 2015-10-09 MED ORDER — ONDANSETRON HCL 4 MG/2ML IJ SOLN
INTRAMUSCULAR | Status: DC | PRN
  Administered 2015-10-09: 4 mg via INTRAVENOUS

## 2015-10-09 MED ORDER — PROPOFOL 10 MG/ML IV BOLUS
INTRAVENOUS | Status: DC | PRN
Start: 2015-10-09 — End: 2015-10-09
  Administered 2015-10-09: 200 mg via INTRAVENOUS

## 2015-10-09 MED ORDER — MEPERIDINE HCL 25 MG/ML IJ SOLN
6.2500 mg | INTRAMUSCULAR | Status: DC | PRN
  Filled 2015-10-09: qty 1

## 2015-10-09 MED ORDER — LIDOCAINE HCL (CARDIAC) 20 MG/ML IV SOLN
INTRAVENOUS | Status: DC | PRN
  Administered 2015-10-09: 80 mg via INTRAVENOUS

## 2015-10-09 MED ORDER — FENTANYL CITRATE (PF) 100 MCG/2ML IJ SOLN
INTRAMUSCULAR | Status: DC | PRN
  Administered 2015-10-09: 50 ug via INTRAVENOUS

## 2015-10-09 MED ORDER — CEFAZOLIN SODIUM-DEXTROSE 2-3 GM-% IV SOLR
2.0000 g | INTRAVENOUS | Status: AC
  Administered 2015-10-09: 2 g via INTRAVENOUS
  Filled 2015-10-09: qty 50

## 2015-10-09 MED ORDER — CEFAZOLIN SODIUM 1-5 GM-% IV SOLN
1.0000 g | INTRAVENOUS | Status: DC
Start: 2015-10-09 — End: 2015-10-09
  Filled 2015-10-09: qty 50

## 2015-10-09 MED ORDER — FENTANYL CITRATE (PF) 100 MCG/2ML IJ SOLN
INTRAMUSCULAR | Status: AC
  Filled 2015-10-09: qty 4

## 2015-10-09 MED ORDER — TRAMADOL HCL 50 MG PO TABS: 50.0000 mg | ORAL_TABLET | Freq: Four times a day (QID) | ORAL | Status: DC | PRN

## 2015-10-09 MED ORDER — STERILE WATER FOR IRRIGATION IR SOLN
Status: DC | PRN
  Administered 2015-10-09: 3000 mL

## 2015-10-09 MED ORDER — LACTATED RINGERS IV SOLN
INTRAVENOUS | Status: DC
  Administered 2015-10-09: 11:00:00 via INTRAVENOUS
  Filled 2015-10-09: qty 1000

## 2015-10-09 MED ORDER — PROMETHAZINE HCL 25 MG/ML IJ SOLN
6.2500 mg | INTRAMUSCULAR | Status: DC | PRN
  Filled 2015-10-09: qty 1

## 2015-10-09 MED ORDER — FENTANYL CITRATE (PF) 100 MCG/2ML IJ SOLN
25.0000 ug | INTRAMUSCULAR | Status: DC | PRN
  Filled 2015-10-09: qty 1

## 2015-10-09 MED ORDER — TRAMADOL HCL 50 MG PO TABS
ORAL_TABLET | ORAL | Status: AC
  Filled 2015-10-09: qty 1

## 2015-10-09 SURGICAL SUPPLY — 27 items
ADAPTER CATH WHT DISP STRL (CATHETERS) IMPLANT
ADPR CATH MP STRL LF DISP BD (CATHETERS)
BAG DRAIN URO-CYSTO SKYTR STRL (DRAIN) ×2 IMPLANT
BAG DRN UROCATH (DRAIN) ×1
BOOTIES KNEE HIGH SLOAN (MISCELLANEOUS) ×2 IMPLANT
CATH ROBINSON RED A/P 16FR (CATHETERS) IMPLANT
CLOTH BEACON ORANGE TIMEOUT ST (SAFETY) ×2 IMPLANT
ELECT REM PT RETURN 9FT ADLT (ELECTROSURGICAL) ×2
ELECTRODE REM PT RTRN 9FT ADLT (ELECTROSURGICAL) ×1 IMPLANT
GLOVE BIO SURGEON STRL SZ7.5 (GLOVE) ×2 IMPLANT
GLOVE BIO SURGEON STRL SZ8 (GLOVE) IMPLANT
GOWN STRL REUS W/ TWL LRG LVL3 (GOWN DISPOSABLE) ×1 IMPLANT
GOWN STRL REUS W/ TWL XL LVL3 (GOWN DISPOSABLE) ×1 IMPLANT
GOWN STRL REUS W/TWL LRG LVL3 (GOWN DISPOSABLE) ×2
GOWN STRL REUS W/TWL XL LVL3 (GOWN DISPOSABLE) ×2
KIT ROOM TURNOVER WOR (KITS) ×2 IMPLANT
MANIFOLD NEPTUNE II (INSTRUMENTS) ×2 IMPLANT
NDL SAFETY ECLIPSE 18X1.5 (NEEDLE) IMPLANT
NEEDLE HYPO 18GX1.5 SHARP (NEEDLE)
NEEDLE HYPO 22GX1.5 SAFETY (NEEDLE) IMPLANT
NEEDLE SPNL 22GX7 QUINCKE BK (NEEDLE) IMPLANT
NS IRRIG 500ML POUR BTL (IV SOLUTION) IMPLANT
PACK CYSTO (CUSTOM PROCEDURE TRAY) ×2 IMPLANT
SYR 20CC LL (SYRINGE) IMPLANT
SYR BULB IRRIGATION 50ML (SYRINGE) IMPLANT
TUBE CONNECTING 12X1/4 (SUCTIONS) IMPLANT
WATER STERILE IRR 3000ML UROMA (IV SOLUTION) ×2 IMPLANT

## 2015-10-09 NOTE — Discharge Instructions (Signed)

## 2015-10-09 NOTE — Anesthesia Postprocedure Evaluation (Signed)
  Anesthesia Post-op Note  Patient: Evan Kirk  Procedure(s) Performed: Procedure(s) (LRB): CYSTOSCOPY WITH BIOPSY (N/A)  Patient Location: PACU  Anesthesia Type: General  Level of Consciousness: awake and alert   Airway and Oxygen Therapy: Patient Spontanous Breathing  Post-op Pain: mild  Post-op Assessment: Post-op Vital signs reviewed, Patient's Cardiovascular Status Stable, Respiratory Function Stable, Patent Airway and No signs of Nausea or vomiting  Last Vitals:  Filed Vitals:   10/09/15 1215  BP:   Pulse: 63  Temp:   Resp: 17    Post-op Vital Signs: stable   Complications: No apparent anesthesia complications

## 2015-10-09 NOTE — H&P (Signed)
Urology Admission H&P  Chief Complaint: gross hematuria  History of Present Illness: [name] is a 74yo with a hx of gross hematuria who was found to have a bladder lesion on the left lateral wall on office cystoscopy. 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   Past Medical History  Diagnosis Date  . Back pain   . Edema   . Chronic atrial fibrillation ([place])   . Anticoagulated on Coumadin     followed by [place] [facility]  . Diverticulosis of colon   . Prediabetes   . Bladder mass   . Renal mass   . HTN (hypertension)   . Diastolic CHF, chronic [facility])     cardiologist-- dr Daneen Schick-- asymptomatic  . Essential tremor 06-2015  . BPH (benign prostatic hypertrophy)   . History of urinary retention   . OSA on CPAP   . Peripheral neuropathy ([place])     BILATERAL FEET   Past Surgical History  Procedure Laterality Date  . Vasectomy reversal  1978  . 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 Transthoracic echocardiogram  08-12-2015       moderate concentric LVH, ef 50-55-%/  trivial MR , PR and TR/  severe LAE and RAE  . Nephrectomy radical Left 08/13/2015    [facility]-    Home Medications:  Prescriptions prior to admission  Medication Sig Dispense Refill Last Dose  . acetaminophen (TYLENOL) 500 MG tablet Take 500 mg by mouth every 6 (six) hours as needed.   Past Month at Unknown time  . carboxymethylcellulose (REFRESH PLUS) 0.5 % SOLN 1 drop 3 (three) times daily as needed.   10/08/2015 at Unknown time  . cholecalciferol (VITAMIN D) 1000 UNITS tablet Take 2,000 Units by mouth daily.   10/08/2015 at Unknown time  . docusate sodium (COLACE) 100 MG capsule Take 100 mg by mouth 2 (two) times daily.   10/08/2015 at Unknown time  . ketoconazole (NIZORAL) 2 % shampoo Apply 1 application topically 2 (two) times a week.   10/09/2015 at 0818  . metoprolol (LOPRESSOR) 50 MG tablet Take 0.5 tablets (25 mg total) by mouth 2 (two) times daily. 30 tablet 6 10/09/2015  at 0800  . Multiple Vitamin (MULTIVITAMIN) capsule Take 1 capsule by mouth daily.   Past Week at Unknown time  . spironolactone (ALDACTONE) 25 MG tablet Take 1 tablet (25 mg total) by mouth 2 (two) times daily.   10/09/2015 at Unknown time  . warfarin (COUMADIN) 5 MG tablet Take 5 mg by mouth as directed. M/w/f/ 7.5 mg then t/th/sa/sun is 5 mg daily by mouth or titrated as directed   10/08/2015 at 1000  . benzoyl peroxide 5 % gel Apply topically daily.   More than a month at Unknown time  . fluocinonide (LIDEX) 0.05 % external solution Apply 1 application topically 2 (two) times daily.   More than a month at Unknown time  . hydrocortisone 2.5 % lotion Apply topically 2 (two) times daily.   More than a month at Unknown time  . loratadine (CLARITIN) 10 MG tablet Take 10 mg by mouth daily.   More than a month at Unknown time  . terazosin (HYTRIN) 5 MG capsule Take 5 mg by mouth at bedtime.   Taking  . traMADol (ULTRAM) 50 MG tablet Take by mouth every 6 (six) hours as needed.   More than a month at Unknown time  . traZODone (DESYREL) 100 MG tablet Take 100 mg by mouth  at bedtime.   More than a month at Unknown time   Allergies:  Allergies  Allergen Reactions  . Diltiazem Other (See Comments)    Pt didn't feel well     Family History  Problem Relation Age of Onset  . Stroke Mother    Social History:  reports that he quit smoking about 49 years ago. He does not have any smokeless tobacco history on file. He reports that he does not drink alcohol or use illicit drugs.  Review of Systems  Genitourinary: Positive for hematuria.  All other systems reviewed and are negative.   Physical Exam:  Vital signs in last 24 hours: Temp:  [97.9 F (36.6 C)] 97.9 F (36.6 C) (10/21 1014) Pulse Rate:  [69] 69 (10/21 1014) Resp:  [16] 16 (10/21 1014) BP: (134)/(60) 134/60 mmHg (10/21 1014) SpO2:  [97 %] 97 % (10/21 1014) Weight:  [121.564 kg (268 lb)] 121.564 kg (268 lb) (10/21 1014) Physical Exam   Constitutional: He is oriented to person, place, and time. He appears well-developed and well-nourished.  HENT:  Head: Normocephalic and atraumatic.  Eyes: EOM are normal. Pupils are equal, round, and reactive to light.  Neck: Normal range of motion. No thyromegaly present.  Cardiovascular: Normal rate and regular rhythm.   Respiratory: Effort normal. No respiratory distress.  GI: Soft. He exhibits no distension.  Musculoskeletal: Normal range of motion.  Neurological: He is alert and oriented to person, place, and time.  Skin: Skin is warm and dry.  Psychiatric: He has a normal mood and affect. His behavior is normal. Judgment and thought content normal.    Laboratory Data:  No results found for this or any previous visit (from the past 24 hour(s)). No results found for this or any previous visit (from the past 240 hour(s)). Creatinine: No results for input(s): CREATININE in the last 168 hours. Baseline Creatinine: unknown  Impression/Assessment:  74yo with left lateral wall bladder mass  Plan:  The risks/benefits/alternatives to cysto, bladder biopsy was explained to the patient and he understands and wishes to proceed with surgery  Ariannie Penaloza L 10/09/2015, 11:05 AM

## 2015-10-09 NOTE — Brief Op Note (Signed)
10/09/2015  11:36 AM  PATIENT:  Evan Kirk  74 y.o. male  PRE-OPERATIVE DIAGNOSIS:  BLADDER MASS  POST-OPERATIVE DIAGNOSIS:  bladder mass  PROCEDURE:  Procedure(s): CYSTOSCOPY WITH BIOPSY (N/A)  SURGEON:  Surgeon(s) and Role:    * Cleon Gustin, MD - Primary  PHYSICIAN ASSISTANT:   ASSISTANTS: none   ANESTHESIA:   general  EBL:  Total I/O In: 100 [I.V.:100] Out: -   BLOOD ADMINISTERED:none  DRAINS: none   LOCAL MEDICATIONS USED:  NONE  SPECIMEN:  Source of Specimen:  posterior bladder wall  DISPOSITION OF SPECIMEN:  PATHOLOGY  COUNTS:  YES  TOURNIQUET:  * No tourniquets in log *  DICTATION: .Note written in EPIC  PLAN OF CARE: Discharge to home after PACU  PATIENT DISPOSITION:  PACU - hemodynamically stable.   Delay start of Pharmacological VTE agent (>24hrs) due to surgical blood loss or risk of bleeding: not applicable

## 2015-10-09 NOTE — Anesthesia Preprocedure Evaluation (Signed)
Anesthesia Evaluation  Patient identified by MRN, date of birth, ID band Patient awake    Reviewed: Allergy & Precautions, NPO status , Patient's Chart, lab work & pertinent test results  Airway Mallampati: II  TM Distance: >3 FB Neck ROM: Full    Dental no notable dental hx.    Pulmonary sleep apnea and Continuous Positive Airway Pressure Ventilation , COPD, former smoker,    Pulmonary exam normal breath sounds clear to auscultation       Cardiovascular hypertension, Pt. on medications and Pt. on home beta blockers +CHF  Normal cardiovascular exam+ dysrhythmias Atrial Fibrillation  Rhythm:Regular Rate:Normal     Neuro/Psych negative neurological ROS  negative psych ROS   GI/Hepatic negative GI ROS, Neg liver ROS,   Endo/Other  Morbid obesity  Renal/GU negative Renal ROS  negative genitourinary   Musculoskeletal negative musculoskeletal ROS (+)   Abdominal   Peds negative pediatric ROS (+)  Hematology negative hematology ROS (+)   Anesthesia Other Findings   Reproductive/Obstetrics negative OB ROS                             Anesthesia Physical Anesthesia Plan  ASA: III  Anesthesia Plan: General   Post-op Pain Management:    Induction: Intravenous  Airway Management Planned: LMA  Additional Equipment:   Intra-op Plan:   Post-operative Plan:   Informed Consent: I have reviewed the patients History and Physical, chart, labs and discussed the procedure including the risks, benefits and alternatives for the proposed anesthesia with the patient or authorized representative who has indicated his/her understanding and acceptance.   Dental advisory given  Plan Discussed with: CRNA  Anesthesia Plan Comments:         Anesthesia Quick Evaluation

## 2015-10-09 NOTE — Anesthesia Procedure Notes (Signed)
Procedure Name: LMA Insertion Date/Time: 10/09/2015 11:20 AM Performed by: Bethena Roys T Pre-anesthesia Checklist: Patient identified, Emergency Drugs available, Suction available and Patient being monitored Patient Re-evaluated:Patient Re-evaluated prior to inductionOxygen Delivery Method: Circle System Utilized Preoxygenation: Pre-oxygenation with 100% oxygen Intubation Type: IV induction Ventilation: Mask ventilation without difficulty LMA: LMA inserted LMA Size: 5.0 Number of attempts: 1 Airway Equipment and Method: Bite block Placement Confirmation: positive ETCO2 Dental Injury: Teeth and Oropharynx as per pre-operative assessment

## 2015-10-09 NOTE — Transfer of Care (Signed)
Immediate Anesthesia Transfer of Care Note  Patient: Dorian Furnace  Procedure(s) Performed: Procedure(s): CYSTOSCOPY WITH BIOPSY (N/A)  Patient Location: PACU  Anesthesia Type:General  Level of Consciousness: awake and oriented  Airway & Oxygen Therapy: Patient Spontanous Breathing and Patient connected to nasal cannula oxygen  Post-op Assessment: Report given to RN  Post vital signs: Reviewed and stable  Last Vitals:  Filed Vitals:   10/09/15 1145  BP: 118/64  Pulse: 72  Temp: 36.7 C  Resp: 19    Complications: No apparent anesthesia complications

## 2015-10-12 NOTE — Op Note (Signed)
Preoperative diagnosis: Bladder lesion  Postop diagnosis: Same  Procedure: 1.  Cystoscopy 2. Ladder biopsy with fulgeration    Attending: Nicolette Bang  Anesthesia: General  Estimated blood loss: 5 cc  Drains: 1. none  Specimens: Bladder lesions posterior and left lateral walls  Antibiotics: Ancef  Findings: 2 erythematous lesions involving the left lateral wall and posterior wall  Indications: Patient is a 74 year old with a history of  Bladder lesions found on office cystoscopy.   After discussing treatment options patient decided to proceed with bladder biopsy  Procedure in detail: Prior to procedure consetn was obtained. Patient was brought to the operating room and briefing was done sure correct patient, correct procedure, correct site.  General anesthesia was in administered patient was placed in the dorsal lithotomy position.  The rigid 67 French cystoscope was passed urethra and bladder.  Bladder was inspected masses or lesions and we noted a 2 subcentimeter lesion on the leftt lateral wall and posterior wall. Using the cold cup biopsy we reopved the 2 lesions. Using the bugbee we fulgerated the biopsy bed to obtain hemostasis.  The bladder was then drained. This concluded the procedure which was well tolerated by the patient.  Complications: None  Condition: Stable,  extubated, transferred to PACU.  Plan: Pt is to be discharged home. He is to followup in 1 week for a pathology discussion

## 2015-10-13 ENCOUNTER — Encounter (HOSPITAL_BASED_OUTPATIENT_CLINIC_OR_DEPARTMENT_OTHER): Payer: Self-pay | Admitting: Urology

## 2015-11-10 ENCOUNTER — Ambulatory Visit: Payer: Medicare Other | Admitting: Dietician

## 2016-02-04 NOTE — Telephone Encounter (Signed)
Left message

## 2016-03-10 ENCOUNTER — Encounter: Payer: Self-pay | Admitting: Interventional Cardiology

## 2016-03-10 ENCOUNTER — Ambulatory Visit (INDEPENDENT_AMBULATORY_CARE_PROVIDER_SITE_OTHER): Payer: Medicare Other | Admitting: Interventional Cardiology

## 2016-03-10 VITALS — BP 126/72 | HR 57 | Ht 67.0 in | Wt 279.0 lb

## 2016-03-10 DIAGNOSIS — I482 Chronic atrial fibrillation, unspecified: Secondary | ICD-10-CM

## 2016-03-10 DIAGNOSIS — I5032 Chronic diastolic (congestive) heart failure: Secondary | ICD-10-CM

## 2016-03-10 DIAGNOSIS — I10 Essential (primary) hypertension: Secondary | ICD-10-CM

## 2016-03-10 DIAGNOSIS — C642 Malignant neoplasm of left kidney, except renal pelvis: Secondary | ICD-10-CM

## 2016-03-10 DIAGNOSIS — G4733 Obstructive sleep apnea (adult) (pediatric): Secondary | ICD-10-CM

## 2016-03-10 DIAGNOSIS — C649 Malignant neoplasm of unspecified kidney, except renal pelvis: Secondary | ICD-10-CM | POA: Insufficient documentation

## 2016-03-10 NOTE — Patient Instructions (Signed)

## 2016-03-10 NOTE — Progress Notes (Signed)
Cardiology Office Note   Date:  03/10/2016   ID:  Evan Kirk, DOB 1941/08/01, MRN [id number]  PCP:   Melinda Crutch, MD  Cardiologist:  Sinclair Grooms, MD   Chief Complaint  Patient presents with  . Congestive Heart Failure  . Atrial Fibrillation      History of Present Illness: Evan Kirk is a 75 y.o. 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  Dyspnea on exertion, unchanged from prior is his only cardiac complaint. He specifically denies chest discomfort. Since I last saw him he has undergone radical left nephrectomy for clear cell carcinoma of the kidney. To this point there is no evidence of metastatic disease. He continues to follow-up with Dr. Elyse Hsu at [facility]. No chemotherapy to this point. Back on Coumadin without bleeding problems. Was in the hospital a total of 5 days.  Past Medical History  Diagnosis Date  . Back pain   . Edema   . Chronic atrial fibrillation ([place])   . Anticoagulated on Coumadin     followed by [place] [facility]  . Diverticulosis of colon   . Prediabetes   . Bladder mass   . Renal mass   . HTN (hypertension)   . Diastolic CHF, chronic [facility])     cardiologist-- dr Daneen Schick-- asymptomatic  . Essential tremor 06-2015  . BPH (benign prostatic hypertrophy)   . History of urinary retention   . OSA on CPAP   . Peripheral neuropathy ([place])     BILATERAL FEET    Past Surgical History  Procedure Laterality Date  . Vasectomy reversal  1978  . 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 Transthoracic echocardiogram  08-12-2015       moderate concentric LVH, ef 50-55-%/  trivial MR , PR and TR/  severe LAE and RAE  . Nephrectomy radical Left 08/13/2015    [facility]-  . Cystoscopy with biopsy N/A 10/09/2015    Procedure: CYSTOSCOPY WITH BIOPSY;  Surgeon: Cleon Gustin, MD;  Location: [facility];  Service: Urology;  Laterality: N/A;     Current Outpatient Prescriptions  Medication Sig Dispense Refill  . acetaminophen (TYLENOL) 500 MG tablet Take 500 mg by mouth every 6 (six) hours as needed.    . benzoyl peroxide 5 % gel Apply topically daily.    . carboxymethylcellulose (REFRESH PLUS) 0.5 % SOLN 1 drop 3 (three) times daily as needed.    . cholecalciferol (VITAMIN D) 1000 UNITS tablet Take 2,000 Units by mouth daily.    Marland Kitchen docusate sodium (COLACE) 100 MG capsule Take 100 mg by mouth 2 (two) times daily.    . fluocinonide (LIDEX) 0.05 % external solution Apply 1 application topically 2 (two) times daily.    . hydrocortisone 2.5 % lotion Apply topically 2 (two) times daily.    Marland Kitchen ketoconazole (NIZORAL) 2 % shampoo Apply 1 application topically 2 (two) times a week.    . loratadine (CLARITIN) 10 MG tablet Take 10 mg by mouth daily as needed for allergies.     . metoprolol (LOPRESSOR) 50 MG tablet Take 0.5 tablets (25 mg total) by mouth 2 (two) times daily. 30 tablet 6  . metroNIDAZOLE (METROCREAM) 0.75 % cream Apply 1 application topically daily as needed. (roscea)    . Multiple Vitamin (MULTIVITAMIN) capsule Take 1 capsule by mouth daily.    Marland Kitchen  OVER THE COUNTER MEDICATION Take 2 capsules by mouth at bedtime. Med Name: INTESTINAL FORMULA #1    . senna (SENOKOT) 8.6 MG tablet Take 1 tablet by mouth at bedtime.    Marland Kitchen spironolactone (ALDACTONE) 25 MG tablet Take 25 mg by mouth daily.    . tamsulosin (FLOMAX) 0.4 MG CAPS capsule Take 0.4 mg by mouth daily.    . traMADol (ULTRAM) 50 MG tablet Take 1 tablet (50 mg total) by mouth every 6 (six) hours as needed. 30 tablet 0  . traZODone (DESYREL) 100 MG tablet Take 100 mg by mouth at bedtime as needed for sleep.     Marland Kitchen warfarin (COUMADIN) 5 MG tablet Take 5 mg by mouth as directed. M/w/f/ 7.5 mg then t/th/sa/sun is 5 mg daily by mouth or titrated as directed     No current facility-administered  medications for this visit.    Allergies:   Diltiazem    Social History:  The patient  reports that he quit smoking about 49 years ago. He has quit using smokeless tobacco. He reports that he does not drink alcohol or use illicit drugs.   Family History:  The patient's family history includes Stroke in his mother.    ROS:  Please see the history of present illness.   Otherwise, review of systems are positive for Sleep apnea wearing C Pap nightly, lower back discomfort persisting, difficulty with balance, anxiety, and chronic shortness of breath unchanged from prior..   All other systems are reviewed and negative.    PHYSICAL EXAM: VS:  BP 126/72 mmHg  Pulse 57  Ht 5\' 7"  (1.702 m)  Wt 279 lb (126.554 kg)  BMI 43.69 kg/m2 , BMI Body mass index is 43.69 kg/(m^2). GEN: Well nourished, well developed, in no acute distress HEENT: normal Neck: no JVD, carotid bruits, or masses Cardiac: IIRR.  There is no murmur, rub, or gallop. There is no edema. Respiratory:  clear to auscultation bilaterally, normal work of breathing. GI: soft, nontender, nondistended, + BS MS: no deformity or atrophy Skin: warm and dry, no rash Neuro:  Strength and sensation are intact Psych: euthymic mood, full affect   EKG:  EKG is  ordered today. The ekg reveals atrial fibrillation with controlled rate of 57 bpm small inferior Q waves. No change from prior tracings.   Recent Labs: 07/10/2015: TSH 1.49 07/20/2015: Creatinine, Ser 1.30* 10/09/2015: Hemoglobin 13.3; Potassium 4.1; Sodium 138    Lipid Panel No results found for: CHOL, TRIG, HDL, CHOLHDL, VLDL, LDLCALC, LDLDIRECT    Wt Readings from Last 3 Encounters:  03/10/16 279 lb (126.554 kg)  10/09/15 268 lb (121.564 kg)  07/17/15 287 lb 8 oz (130.409 kg)      Other studies Reviewed: Additional studies/ records that were reviewed today include: [facility] records pertaining to treatment of clear cell renal carcinoma by Dr Sherral Hammers at  [facility].. The findings include no obvious evidence of cardiac complications, volume overload or other problems related to the surgical procedure..    ASSESSMENT AND PLAN:  1. Chronic diastolic heart failure (HCC) Underwent major surgery at [facility] done included multiple transfusions without developing overt heart failure - EKG 12-Lead  2. Chronic atrial fibrillation (HCC) Controlled rate - EKG 12-Lead  3. Essential hypertension Excellent control - EKG 12-Lead  4. OSA (obstructive sleep apnea) Uses C Pap  5. 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  The patient has the following concerns regarding medicines: None.  The following changes/actions have been instituted:    Encouraged aerobic activity  Call of dyspnea chest pain  Labs/ tests ordered today include:  Orders Placed This Encounter  Procedures  . EKG 12-Lead     Disposition:   FU with HS in 1 year  Signed, Sinclair Grooms, MD  03/10/2016 12:28 PM    [address], [place], [place]  [address] Phone: [phone]; Fax: [phone]

## 2016-06-24 ENCOUNTER — Ambulatory Visit (INDEPENDENT_AMBULATORY_CARE_PROVIDER_SITE_OTHER): Payer: Medicare Other | Admitting: Neurology

## 2016-06-24 ENCOUNTER — Encounter: Payer: Self-pay | Admitting: Neurology

## 2016-06-24 VITALS — BP 136/78 | HR 84 | Ht 67.0 in | Wt 287.0 lb

## 2016-06-24 DIAGNOSIS — G609 Hereditary and idiopathic neuropathy, unspecified: Secondary | ICD-10-CM

## 2016-06-24 DIAGNOSIS — R55 Syncope and collapse: Secondary | ICD-10-CM

## 2016-06-24 NOTE — Patient Instructions (Signed)
1. We have sent a referral to [facility] for your MRI and they will call you directly to schedule your appt. They are located at [address]. If you need to contact them directly please call [phone]. 2. Will schedule EEG and 48 hour EEG.

## 2016-06-24 NOTE — Progress Notes (Signed)
Evan Kirk was seen today in the movement disorders clinic for neurologic consultation at the request of  Melinda Crutch, MD.  The consultation is for the evaluation of tremor and possible PD.  He was previously seen at the [place] [facility] for the same.  No records are available from them.  This patient is accompanied in the office by his spouse who supplements the history.  Tremor started in 2010 and it started in the head.  Tremor is generally in the "yes" direction according to the patient but pt states that it is in both the "yes" and "no" direction.  Not long after it started in the head, it started in the hands.  He can stop it for a short period of time if he tries.  The L hand shakes more than the right.  It only seems to shake at rest.  He will notice some leg tremor when driving and notices it bilaterally.  No fam hx of tremor or PD.     06/24/16 update:  The patient returns for follow-up.  I have not seen him in about a year.  At that point in time, I saw him for complaints of tremor, but very little was noted on examination.  He has peripheral neuropathy as well.  His hemoglobin A1c last visit was 6.1.  B12 was just slightly low at 369 and I asked him to start a supplement.  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  His bladder was also biopsied but that was negative for cancer.  Reports that he passed out the Sunday before fathers day he passed out. Leaned over to pick something up and felt lightheaded and passed out.  Woke up once hit ground.  Could not get up.  States that several other falls where he got out of balance and fell but no other syncopal episodes.  Describes one episode where he was at granddaughters bday party where his right hand would claw down and he couldn't get the fork out out of it.  It was trembling a bit as well.  It lasted a few mins.  That seems to happen every 2-3 months and only  seemed to start after the nephrectomy.    Neuroimaging has previously been performed.  It is not available for my review today.  There was apparently an MRI brain done at the [facility] center over a year ago for tremor but not since this new stuff was done.  PREVIOUS MEDICATIONS: none to date  ALLERGIES:   Allergies  Allergen Reactions  . Diltiazem Other (See Comments)    Pt didn't feel well     CURRENT MEDICATIONS:  Outpatient Encounter Prescriptions as of 06/24/2016  Medication Sig  . acetaminophen (TYLENOL) 500 MG tablet Take 500 mg by mouth every 6 (six) hours as needed.  . benzoyl peroxide 5 % gel Apply topically daily.  . carboxymethylcellulose (REFRESH PLUS) 0.5 % SOLN 1 drop 3 (three) times daily as needed.  . cholecalciferol (VITAMIN D) 1000 UNITS tablet Take 2,000 Units by mouth daily.  Marland Kitchen docusate sodium (COLACE) 100 MG capsule Take 100 mg by mouth 2 (two) times daily.  . hydrocortisone 2.5 % lotion Apply topically 2 (two) times daily.  Marland Kitchen ketoconazole (NIZORAL) 2 % shampoo Apply 1 application topically 2 (two) times a week.  . loratadine (CLARITIN) 10 MG tablet Take 10 mg by mouth daily as needed  for allergies.   . metoprolol (LOPRESSOR) 50 MG tablet Take 0.5 tablets (25 mg total) by mouth 2 (two) times daily.  . metroNIDAZOLE (METROCREAM) 0.75 % cream Apply 1 application topically daily as needed. (roscea)  . Multiple Vitamin (MULTIVITAMIN) capsule Take 1 capsule by mouth daily.  Marland Kitchen OVER THE COUNTER MEDICATION Take 2 capsules by mouth at bedtime. Med Name: INTESTINAL FORMULA #1  . senna (SENOKOT) 8.6 MG tablet Take 1 tablet by mouth at bedtime.  Marland Kitchen spironolactone (ALDACTONE) 25 MG tablet Take 25 mg by mouth daily.  . tamsulosin (FLOMAX) 0.4 MG CAPS capsule Take 0.4 mg by mouth daily.  . traMADol (ULTRAM) 50 MG tablet Take 1 tablet (50 mg total) by mouth every 6 (six) hours as needed.  . traZODone (DESYREL) 100 MG tablet Take 100 mg by mouth at bedtime as needed for sleep.   Marland Kitchen  warfarin (COUMADIN) 5 MG tablet Take 5 mg by mouth as directed. M/w/f/ 7.5 mg then t/th/sa/sun is 5 mg daily by mouth or titrated as directed  . [DISCONTINUED] fluocinonide (LIDEX) 0.05 % external solution Apply 1 application topically 2 (two) times daily.   No facility-administered encounter medications on file as of 06/24/2016.    PAST MEDICAL HISTORY:   Past Medical History  Diagnosis Date  . Back pain   . Edema   . Chronic atrial fibrillation ([place])   . Anticoagulated on Coumadin     followed by [place] [facility]  . Diverticulosis of colon   . Prediabetes   . Bladder mass   . Renal mass   . HTN (hypertension)   . Diastolic CHF, chronic [facility])     cardiologist-- dr Daneen Schick-- asymptomatic  . Essential tremor 06-2015  . BPH (benign prostatic hypertrophy)   . History of urinary retention   . OSA on CPAP   . Peripheral neuropathy ([place])     BILATERAL FEET    PAST SURGICAL HISTORY:   Past Surgical History  Procedure Laterality Date  . Vasectomy reversal  1978  . 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 Transthoracic echocardiogram  08-12-2015       moderate concentric LVH, ef 50-55-%/  trivial MR , PR and TR/  severe LAE and RAE  . Nephrectomy radical Left 08/13/2015    [facility]-  . Cystoscopy with biopsy N/A 10/09/2015    Procedure: CYSTOSCOPY WITH BIOPSY;  Surgeon: Cleon Gustin, MD;  Location: [facility];  Service: Urology;  Laterality: N/A;  . Nephrectomy Left     SOCIAL HISTORY:   Social History   Social History  . Marital Status: Married    Spouse Name: N/A  . Number of Children: N/A  . Years of Education: N/A   Occupational History  . retired     Therapist, art x 5 years; Water engineer   Social History Main Topics  . Smoking status: Former Smoker    Quit date: 04/18/1966  . Smokeless tobacco: Former Systems developer  . Alcohol Use: No  . Drug Use: No  . Sexual Activity: Not on file   Other Topics Concern  . Not on file   Social History Narrative     FAMILY HISTORY:   Family Status  Relation Status Death Age  . Mother Deceased     stroke, ilitis, sepsis  . Father Deceased     leukemia, prostate cancer  . Brother Alive     unknown  . Brother Alive     intestinal problem (colostomy/ileostomy)  . Brother  Alive     diabetes  . Son Alive     healthy  . Son Alive     healthy  . Son Alive     healthy  . Son Alive     healthy  . Son Alive     healthy    ROS:  A complete 10 system review of systems was obtained and was unremarkable apart from what is mentioned above.  PHYSICAL EXAMINATION:    VITALS:   Filed Vitals:   06/24/16 1244  BP: 136/78  Pulse: 84  Height: 5\' 7"  (1.702 m)  Weight: 287 lb (130.182 kg)    GEN:  The patient appears stated age and is in NAD. HEENT:  Normocephalic, atraumatic.  The mucous membranes are moist. The superficial temporal arteries are without ropiness or tenderness. CV:  Irreg irreg Lungs:  CTAB but DOE and even some conversational dyspnea Neck/HEME:  There are no carotid bruits bilaterally.  Neurological examination:  Orientation: The patient is alert and oriented x3. Fund of knowledge is appropriate.  Recent and remote memory are intact.  Attention and concentration are normal.    Able to name objects and repeat phrases. Cranial nerves: There is good facial symmetry. Pupils are equal round and reactive to light bilaterally. Fundoscopic exam reveals clear margins bilaterally. Extraocular muscles are intact. The visual fields are full to confrontational testing. The speech is fluent and clear. Soft palate rises symmetrically and there is no tongue deviation. Hearing is intact to conversational tone. Sensation: Sensation is intact to light and pinprick throughout (facial, trunk, extremities). Vibration is markedly decreased distally being nearly absent at the knees. There is no extinction with double simultaneous stimulation. There is no sensory dermatomal level identified. Motor:  Strength is 5/5 in the bilateral upper and lower extremities.   Shoulder shrug is equal and symmetric.  There is no pronator drift. Deep tendon reflexes: Deep tendon reflexes are 1/4 at the bilateral biceps, triceps, brachioradialis, absent at the bilateral patella and achilles. Plantar responses are downgoing bilaterally.  Movement examination: Tone: There is normal tone in the bilateral upper extremities.  The tone in the lower extremities is normal.  Abnormal movements: none.  There is minimal head tremor that I saw just momentary in the "yes" direction.  He has tremor of the outstretched hands but it is minimal.   Coordination:  There is no decremation with RAM's, with any form of RAMS, including alternating supination and pronation of the forearm, hand opening and closing, finger taps, heel taps and toe taps.   Gait and Station: The patient has difficulty arising out of a deep-seated chair without the use of the hands (primarily because of weight). The patient's stride length is normal but he is wide based.  He cannot ambulate in a tandem fashion.  He is able to stand in the Romberg position with eyes open, but not with eyes closed.   ASSESSMENT/PLAN:  1.  Tremor  -I suspect that the patient has essential tremor, overall very mild 2.  Gait instability and strong evidence of a significant peripheral neuropathy  -Has been exposed to agent organe.   -A1C is borderline  -We discussed safety associated with peripheral neuropathy.  3.  Episodes of R arm drawing up with hand clawing ever since nephrectomy  -do EEG/ambulatory EEG  -do MRI brain 4.  Syncopal episode  -do EEG  -needs f/u with cardiology 5. F/u based on above.  Discussed seizure/safety.  Much greater than 50% of this visit was  spent in counseling and coordinating care.  Total face to face time:  30 min

## 2016-06-30 ENCOUNTER — Ambulatory Visit (INDEPENDENT_AMBULATORY_CARE_PROVIDER_SITE_OTHER): Payer: Medicare Other | Admitting: Neurology

## 2016-06-30 ENCOUNTER — Telehealth: Payer: Self-pay | Admitting: Neurology

## 2016-06-30 DIAGNOSIS — R55 Syncope and collapse: Secondary | ICD-10-CM | POA: Diagnosis not present

## 2016-06-30 NOTE — Telephone Encounter (Signed)
Patient made aware.   Evan Kirk - please schedule 48 hour EEG.

## 2016-06-30 NOTE — Telephone Encounter (Signed)
-----   Message from [place], DO sent at 06/30/2016  3:06 PM EDT ----- Let pt know that routine EEG looked good.  Schedule ambulatory if that is next step

## 2016-06-30 NOTE — Procedures (Signed)
TECHNICAL SUMMARY:  A multichannel referential and bipolar montage EEG using the standard international 10-20 system was performed on the patient described as awake, drowsy and asleep.  The dominant background activity consists of 9-10 hertz activity seen most prominantly over the posterior head region.  The backgound activity is reactive to eye opening and closing procedures.  Low voltage fast (beta) activity is distributed symmetrically and maximally over the anterior head regions.  ACTIVATION:  Stepwise photic stimulation at 4-20 flashes per second was performed and did not elicit any abnormal waveforms.  Hyperventilation was not performed.  EPILEPTIFORM ACTIVITY:  There were no spikes, sharp waves or paroxysmal activity.  SLEEP:  Both stage I and stage II sleep are noted.  CARDIAC:  The EKG lead revealed atrial fibrillation  IMPRESSION:  This is a normal EEG for the patients stated age.  There were no focal, hemispheric or lateralizing features.  No epileptiform activity was recorded.  A normal EEG does not exclude the diagnosis of a seizure disorder and if seizure remains high on the list of differential diagnosis, an ambulatory EEG may be of value.  Clinical correlation is required.

## 2016-07-06 ENCOUNTER — Ambulatory Visit
Admission: RE | Admit: 2016-07-06 | Discharge: 2016-07-06 | Disposition: A | Discharge status: Home / Self Care | Payer: Medicare Other | Source: Ambulatory Visit | Attending: Neurology | Admitting: Neurology

## 2016-07-06 DIAGNOSIS — R55 Syncope and collapse: Secondary | ICD-10-CM

## 2016-07-07 ENCOUNTER — Telehealth: Payer: Self-pay | Admitting: Neurology

## 2016-07-07 NOTE — Telephone Encounter (Signed)
Talked with patient and gave him MRI results.  He had MRI brain at [place] about a year ago and nothing was said about fluid collection then.  He will get [facility] films for me.  Told him likely non surgical but given shift (chronic) would like him to see neurosurgeon and he was agreeable.  Told him that I do think that he could be having focal seizure from it and even if EEG thurs is okay, I still likely want to start med and he was okay with that.  Will touch base after that EEG

## 2016-07-07 NOTE — Telephone Encounter (Signed)
Referral faxed to [facility] at [phone] with confirmation received. They will contact the patient to schedule.  

## 2016-07-11 ENCOUNTER — Ambulatory Visit (INDEPENDENT_AMBULATORY_CARE_PROVIDER_SITE_OTHER): Payer: Medicare Other | Admitting: Neurology

## 2016-07-11 DIAGNOSIS — R55 Syncope and collapse: Secondary | ICD-10-CM | POA: Diagnosis not present

## 2016-07-18 ENCOUNTER — Telehealth: Payer: Self-pay | Admitting: Neurology

## 2016-07-18 NOTE — Telephone Encounter (Signed)
-----   Message from Alda Berthold, DO sent at 07/18/2016  2:08 PM EDT ----- Please inform patient his EEG was normal.  Thanks.

## 2016-07-18 NOTE — Telephone Encounter (Signed)
Left message on machine for patient to call back.

## 2016-07-18 NOTE — Procedures (Signed)
ELECTROENCEPHALOGRAM REPORT  Dates of Recording: 07/11/2016 to 07/13/2016  Patient's Name: Evan Kirk MRN: [id number] Date of Birth: 1941/03/27  Referring Provider: Dr. Wells Guiles Tat  Procedure: 48-hour ambulatory EEG  History: This is a 75 year old man with episodes of right arm drawing up with hand clawing, syncopal episode.  Medications:  acetaminophen (TYLENOL) 500 MG tablet  cholecalciferol (VITAMIN D) 1000 UNITS  metoprolol (LOPRESSOR) 50 MG tablet spironolactone (ALDACTONE) 25 MG tablet tamsulosin (FLOMAX) 0.4 MG CAPS  traMADol (ULTRAM) 50 MG tablet traZODone (DESYREL) 100 MG tablet warfarin (COUMADIN) 5 MG tablet   Technical Summary: This is a 48-hour multichannel digital EEG recording measured by the international 10-20 system with electrodes applied with paste and impedances below 5000 ohms performed as portable with EKG monitoring.  The digital EEG was referentially recorded, reformatted, and digitally filtered in a variety of bipolar and referential montages for optimal display.    DESCRIPTION OF RECORDING: During maximal wakefulness, the background activity consisted of a symmetric 9.5 Hz posterior dominant rhythm which was reactive to eye opening.  There were no epileptiform discharges or focal slowing seen in wakefulness.  During the recording, the patient progresses through wakefulness, drowsiness, and Stage 2 sleep.  Again, there were no epileptiform discharges seen.  Events: On 07/24 at 1026 hours, patient reports a headache on the top and left back of head. Electrographically, there were no EEG or EKG changes seen.  There were no electrographic seizures seen.  EKG lead was unremarkable.  IMPRESSION: This 48-hour ambulatory EEG study is normal. Typical events were not captured.  CLINICAL CORRELATION: A normal EEG does not exclude a clinical diagnosis of epilepsy. Typical events were not captured. If further clinical questions remain, inpatient video EEG  monitoring may be helpful.   Ellouise Newer, M.D.

## 2016-07-19 NOTE — Telephone Encounter (Signed)
Follow up appt made with patient.  

## 2016-07-19 NOTE — Telephone Encounter (Signed)
Patient's wife made aware of results. According to last office note - even if normal may start on medication. Dr. Carles Collet please advise.

## 2016-07-19 NOTE — Telephone Encounter (Signed)
Make appt for him to discuss with me.  Will need medicine but want to talk to him about it face to face so he knows r/b/se

## 2016-08-04 NOTE — Progress Notes (Signed)
Evan Kirk was seen today in the movement disorders clinic for neurologic consultation at the request of  Melinda Crutch, MD.  The consultation is for the evaluation of tremor and possible PD.  He was previously seen at the [facility] for the same.  No records are available from them.  This patient is accompanied in the office by his spouse who supplements the history.  Tremor started in 2010 and it started in the head.  Tremor is generally in the "yes" direction according to the patient but pt states that it is in both the "yes" and "no" direction.  Not long after it started in the head, it started in the hands.  He can stop it for a short period of time if he tries.  The L hand shakes more than the right.  It only seems to shake at rest.  He will notice some leg tremor when driving and notices it bilaterally.  No fam hx of tremor or PD.     06/24/16 update:  The patient returns for follow-up.  I have not seen him in about a year.  At that point in time, I saw him for complaints of tremor, but very little was noted on examination.  He has peripheral neuropathy as well.  His hemoglobin A1c last visit was 6.1.  B12 was just slightly low at 369 and I asked him to start a supplement.  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  His bladder was also biopsied but that was negative for cancer.  Reports that he passed out the Sunday before fathers day he passed out. Leaned over to pick something up and felt lightheaded and passed out.  Woke up once hit ground.  Could not get up.  States that several other falls where he got out of balance and fell but no other syncopal episodes.  Describes one episode where he was at granddaughters bday party where his right hand would claw down and he couldn't get the fork out out of it.  It was trembling a bit as well.  It lasted a few mins.  That seems to happen every 2-3 months and only  seemed to start after the nephrectomy.    08/05/16 update:  The patient returns today, accompanied by his wife who supplements the history.  The patient had an MRI of the brain on 07/06/2016 without gadolinium.  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 I reviewed those films with pt/wife today.  He does have a neurosurgery appointment on August 24.  He brought me an MRI of the brain from the [facility] from 06/28/2014 and this was not present at that time.  There was only severe atrophy and advanced white matter disease.  He also had a carotid ultrasound from the [facility] that did not demonstrate any hemodynamically significant stenosis.  He had a routine and ambulatory EEG that were unremarkable.  This was because he was describing episodes where his right hand with claw up and start trembling.  Neuroimaging has previously been performed.  It is not available for my review today.  There was apparently an MRI brain done at the [facility] over a year ago for tremor but not since this new stuff was done.  PREVIOUS MEDICATIONS: none to date  ALLERGIES:   Allergies  Allergen Reactions  . Diltiazem Other (See Comments)    Pt didn't feel well     CURRENT MEDICATIONS:  Outpatient Encounter Prescriptions as of 08/05/2016  Medication Sig  . acetaminophen (TYLENOL) 500 MG tablet Take 500 mg by mouth every 6 (six) hours as needed.  . benzoyl peroxide 5 % gel Apply topically daily.  . carboxymethylcellulose (REFRESH PLUS) 0.5 % SOLN 1 drop 3 (three) times daily as needed.  . cholecalciferol (VITAMIN D) 1000 UNITS tablet Take 2,000 Units by mouth daily.  Marland Kitchen docusate sodium (COLACE) 100 MG capsule Take 100 mg by mouth 2 (two) times daily.  . hydrocortisone 2.5 % lotion Apply topically 2 (two) times daily.  Marland Kitchen ketoconazole (NIZORAL) 2 % shampoo Apply 1 application topically 2 (two) times a week.  . loratadine (CLARITIN) 10 MG  tablet Take 10 mg by mouth daily as needed for allergies.   . metoprolol (LOPRESSOR) 50 MG tablet Take 0.5 tablets (25 mg total) by mouth 2 (two) times daily.  . metroNIDAZOLE (METROCREAM) 0.75 % cream Apply 1 application topically daily as needed. (roscea)  . Multiple Vitamin (MULTIVITAMIN) capsule Take 1 capsule by mouth daily.  Marland Kitchen OVER THE COUNTER MEDICATION Take 2 capsules by mouth at bedtime. Med Name: INTESTINAL FORMULA #1  . senna (SENOKOT) 8.6 MG tablet Take 1 tablet by mouth at bedtime.  Marland Kitchen spironolactone (ALDACTONE) 25 MG tablet Take 25 mg by mouth daily.  . tamsulosin (FLOMAX) 0.4 MG CAPS capsule Take 0.4 mg by mouth daily.  . traMADol (ULTRAM) 50 MG tablet Take 1 tablet (50 mg total) by mouth every 6 (six) hours as needed.  . traZODone (DESYREL) 100 MG tablet Take 100 mg by mouth at bedtime as needed for sleep.   Marland Kitchen warfarin (COUMADIN) 5 MG tablet Take 5 mg by mouth as directed. M/w/f/ 7.5 mg then t/th/sa/sun is 5 mg daily by mouth or titrated as directed   No facility-administered encounter medications on file as of 08/05/2016.     PAST MEDICAL HISTORY:   Past Medical History:  Diagnosis Date  . Anticoagulated on Coumadin    followed by [facility]  . Back pain   . Bladder mass   . BPH (benign prostatic hypertrophy)   . Chronic atrial fibrillation ([place])   . Diastolic CHF, chronic [facility])    cardiologist-- dr Daneen Schick-- asymptomatic  . Diverticulosis of colon   . Edema   . Essential tremor 06-2015  . History of urinary retention   . HTN (hypertension)   . OSA on CPAP   . Peripheral neuropathy (HCC)    BILATERAL FEET  . Prediabetes   . Renal mass     PAST SURGICAL HISTORY:   Past Surgical History:  Procedure Laterality Date  . CYSTOSCOPY WITH BIOPSY N/A 10/09/2015   Procedure: CYSTOSCOPY WITH BIOPSY;  Surgeon: Cleon Gustin, MD;  Location: [facility];  Service: Urology;  Laterality: N/A;  . NEPHRECTOMY Left   . NEPHRECTOMY RADICAL Left  08/13/2015   [facility]-  . TOTAL KNEE ARTHROPLASTY Right 11-10-2008  . TRANSTHORACIC ECHOCARDIOGRAM  08-12-2015      moderate concentric LVH, ef 50-55-%/  trivial MR , PR and TR/  severe LAE and RAE  . VASECTOMY REVERSAL  1978    SOCIAL HISTORY:   Social History   Social History  . Marital status: Married    Spouse name: N/A  . Number of children: N/A  . Years of education: N/A   Occupational History  .  retired     Therapist, art x 5 years; Water engineer   Social History Main Topics  . Smoking status: Former Smoker    Quit date: 04/18/1966  . Smokeless tobacco: Former Systems developer  . Alcohol use No  . Drug use: No  . Sexual activity: Not on file   Other Topics Concern  . Not on file   Social History Narrative  . No narrative on file    FAMILY HISTORY:   Family Status  Relation Status  . Mother Deceased   stroke, ilitis, sepsis  . Father Deceased   leukemia, prostate cancer  . Brother Alive   unknown  . Brother Alive   intestinal problem (colostomy/ileostomy)  . Brother Alive   diabetes  . Son Alive   healthy  . Son Alive   healthy  . Son Alive   healthy  . Son Alive   healthy  . Son Alive   healthy    ROS:  A complete 10 system review of systems was obtained and was unremarkable apart from what is mentioned above.  PHYSICAL EXAMINATION:    VITALS:   Vitals:   08/05/16 1042  BP: 108/62  Pulse: 70  Weight: 288 lb (130.6 kg)  Height: 5\' 7"  (1.702 m)    GEN:  The patient appears stated age and is in NAD. HEENT:  Normocephalic, atraumatic.  The mucous membranes are moist. The superficial temporal arteries are without ropiness or tenderness. CV:  Irreg irreg Lungs:  CTAB but DOE and even some conversational dyspnea Neck/HEME:  There are no carotid bruits bilaterally.  Neurological examination:  Orientation: The patient is alert and oriented x3.  Cranial nerves: There is good facial symmetry.  Extraocular muscles are intact. The visual fields are full to  confrontational testing. The speech is fluent and clear. Soft palate rises symmetrically and there is no tongue deviation. Hearing is intact to conversational tone. Sensation: Sensation is intact to light touch throughout Motor: Strength is 5/5 in the bilateral upper and lower extremities.   Shoulder shrug is equal and symmetric.  There is no pronator drift.  Movement examination: Tone: There is normal tone in the bilateral upper extremities.  The tone in the lower extremities is normal.  Abnormal movements: There is tremor of the outstretched hands, R more than L.  There is minimal head tremor that I saw just momentary in the "yes" direction.  He has tremor of the outstretched hands but it is minimal.   Coordination:  There is no decremation with RAM's, with any form of RAMS, including alternating supination and pronation of the forearm, hand opening and closing, finger taps, heel taps and toe taps.   Gait and Station: The patient has difficulty arising out of a deep-seated chair without the use of the hands (primarily because of weight). The patient's stride length is normal but he is wide based.  He cannot ambulate in a tandem fashion.  He is able to stand in the Romberg position with eyes open, but not with eyes closed.  Lab Results  Component Value Date   VITAMINB12 369 07/10/2015      ASSESSMENT/PLAN:  1.  Tremor  -I suspect that the patient has essential tremor, overall very mild 2.  Gait instability and strong evidence of a significant peripheral neuropathy  -Has been exposed to agent orange.   -A1C is borderline.   -B12 is slightly low and told previously to take supplement but hadn't so reminded today to take 1044mcg of B12 and  can recheck in 6 months.    -We discussed safety associated with peripheral neuropathy.  3.  Episodes of R arm drawing up with hand clawing ever since nephrectomy  -While his EEG and ambulatory EEG were negative, his MRI of the brain did show a hygroma  over the left convexity with associated 6 mm shift.  It is certainly possible that he could be having simple partial seizures and he and I talked about this.  We talked about risks and benefits of various medication and ultimately he decided to hold on trying Keppra until he talks with Dr. Christella Noa next week.  I do not think that he will need surgery as this appears very chronic in nature. 4.  Syncopal episode  -EEG negative  -still believe needs cardiology f/u 5. F/u based on above.  Discussed seizure/safety.  Much greater than 50% of this visit was spent in counseling and coordinating care.  Total face to face time:  30 min

## 2016-08-05 ENCOUNTER — Encounter: Payer: Self-pay | Admitting: Neurology

## 2016-08-05 ENCOUNTER — Ambulatory Visit (INDEPENDENT_AMBULATORY_CARE_PROVIDER_SITE_OTHER): Payer: Medicare Other | Admitting: Neurology

## 2016-08-05 VITALS — BP 108/62 | HR 70 | Ht 67.0 in | Wt 288.0 lb

## 2016-08-05 DIAGNOSIS — R9402 Abnormal brain scan: Secondary | ICD-10-CM

## 2016-08-05 DIAGNOSIS — E538 Deficiency of other specified B group vitamins: Secondary | ICD-10-CM

## 2016-08-05 DIAGNOSIS — R251 Tremor, unspecified: Secondary | ICD-10-CM | POA: Diagnosis not present

## 2016-08-05 DIAGNOSIS — G609 Hereditary and idiopathic neuropathy, unspecified: Secondary | ICD-10-CM

## 2016-08-05 NOTE — Patient Instructions (Signed)
1.  Your [facility] films are uploaded into the [facility] PACS system for Dr. Christella Noa but you may want to take the CD you have anyway 2.  Call me about the [facility] and let me know what you decide.

## 2016-08-12 ENCOUNTER — Telehealth: Payer: Self-pay | Admitting: Neurology

## 2016-08-12 NOTE — Telephone Encounter (Signed)
Can you call their office and find out what happened?

## 2016-08-12 NOTE — Telephone Encounter (Signed)
[phone]. He would like an open referral to go to any Neuro Surgeon. He  did not have a good experience there at all. They would like to go somewhere else. The Doctor was not in the office and the staff didn't know where he was. His wife would like you to call them please. Thank you

## 2016-08-12 NOTE — Telephone Encounter (Signed)
Spoke with two different people at [facility]. All they could tell me was that Dr. Christella Noa had clinic all day yesterday so was not in surgery. He frequently runs late and that was probably what happened- they couldn't explain to me why patient would be told they don't know where he was- please advise on alternate surgery practice?

## 2016-08-12 NOTE — Telephone Encounter (Signed)
Please advise.   He had appt with [facility] yesterday.

## 2016-08-13 NOTE — Telephone Encounter (Signed)
Let pt know that this is only neurosx group in town so will need to go out of town if he wants to leave that group.  If willing, see if able to get into see Dr. Wende Mott at [facility] for opinion and how long that will be.  Could try Dr. Edrick Oh at [place] if patient willing to travel there

## 2016-08-15 ENCOUNTER — Telehealth: Payer: Self-pay | Admitting: Neurology

## 2016-08-15 NOTE — Telephone Encounter (Signed)
Referral faxed to [phone] with confirmation received. They will call patient to schedule appt.

## 2016-08-15 NOTE — Telephone Encounter (Signed)
Patient wife sandra called and needs to check the status of the referral that was to be sent to [facility] please call [phone]

## 2016-08-15 NOTE — Telephone Encounter (Signed)
PT's wife called and wants Dr Tat to refer him to a Neuro surgeon/[facility] CB# [phone]

## 2016-08-15 NOTE — Telephone Encounter (Signed)
Spoke with patient's wife and she wants referral to multiple places at once. Made her aware I have sent referral to [facility]- if she is not happy with the date they give her for neurosurgery appt we can refer elsewhere but shouldn't be referring multiple places for the same issue. She expressed understanding.

## 2016-08-15 NOTE — Telephone Encounter (Signed)
Patient's wife made aware and would like a referral to Dr. Wende Mott.

## 2017-03-10 ENCOUNTER — Ambulatory Visit: Payer: Medicare Other | Admitting: Interventional Cardiology

## 2017-03-16 ENCOUNTER — Encounter: Payer: Self-pay | Admitting: Interventional Cardiology

## 2017-03-29 ENCOUNTER — Ambulatory Visit: Payer: Medicare Other | Admitting: Interventional Cardiology

## 2017-05-04 NOTE — Progress Notes (Signed)
Cardiology Office Note    Date:  05/05/2017   ID:  Evan Kirk, DOB 1941-06-08, MRN [id number]  PCP:  Lawerance Cruel, MD  Cardiologist: Sinclair Grooms, MD   Chief Complaint  Patient presents with  . Shortness of Breath  . Congestive Heart Failure    History of Present Illness:  Evan Kirk is a 76 y.o. male who presents for Chronic atrial fibrillation, chronic diastolic heart failure, clear cell carcinoma of the left kidney status post resection, hypertension, and chronic anticoagulation therapy  Chronic dyspnea on exertion. No chest discomfort. Previous heart catheterization did not demonstrate significant obstructive disease. He has chronic atrial fibrillation with no particular palpitations. He is on chronic anticoagulation therapy to prevent stroke. He has not had syncope. He uses continuous positive airway pressure with good effect. He is compliant with his medical regimen. He denies blood in urine and stool.  Past Medical History:  Diagnosis Date  . Anticoagulated on Coumadin    followed by [facility]  . Back pain   . Bladder mass   . BPH (benign prostatic hypertrophy)   . Chronic atrial fibrillation ([place])   . Diastolic CHF, chronic [facility])    cardiologist-- dr Daneen Schick-- asymptomatic  . Diverticulosis of colon   . Edema   . Essential tremor 06-2015  . History of urinary retention   . HTN (hypertension)   . OSA on CPAP   . Peripheral neuropathy    BILATERAL FEET  . Prediabetes   . Renal mass     Past Surgical History:  Procedure Laterality Date  . CYSTOSCOPY WITH BIOPSY N/A 10/09/2015   Procedure: CYSTOSCOPY WITH BIOPSY;  Surgeon: Cleon Gustin, MD;  Location: [facility];  Service: Urology;  Laterality: N/A;  . NEPHRECTOMY Left   . NEPHRECTOMY RADICAL Left 08/13/2015   [facility]-  . TOTAL KNEE ARTHROPLASTY Right 11-10-2008  . TRANSTHORACIC ECHOCARDIOGRAM  08-12-2015      moderate concentric LVH, ef 50-55-%/  trivial  MR , PR and TR/  severe LAE and RAE  . VASECTOMY REVERSAL  1978    Current Medications: Outpatient Medications Prior to Visit  Medication Sig Dispense Refill  . acetaminophen (TYLENOL) 500 MG tablet Take 500 mg by mouth every 6 (six) hours as needed.    . benzoyl peroxide 5 % gel Apply topically daily.    . carboxymethylcellulose (REFRESH PLUS) 0.5 % SOLN 1 drop 3 (three) times daily as needed.    . cholecalciferol (VITAMIN D) 1000 UNITS tablet Take 2,000 Units by mouth daily.    Marland Kitchen docusate sodium (COLACE) 100 MG capsule Take 100 mg by mouth 2 (two) times daily.    . hydrocortisone 2.5 % lotion Apply topically 2 (two) times daily.    Marland Kitchen ketoconazole (NIZORAL) 2 % shampoo Apply 1 application topically 2 (two) times a week.    . loratadine (CLARITIN) 10 MG tablet Take 10 mg by mouth daily as needed for allergies.     . metoprolol (LOPRESSOR) 50 MG tablet Take 0.5 tablets (25 mg total) by mouth 2 (two) times daily. 30 tablet 6  . metroNIDAZOLE (METROCREAM) 0.75 % cream Apply 1 application topically daily as needed. (roscea)    . Multiple Vitamin (MULTIVITAMIN) capsule Take 1 capsule by mouth daily.    Marland Kitchen OVER THE COUNTER MEDICATION Take 2 capsules by mouth at bedtime. Med Name: INTESTINAL FORMULA #1    . senna (SENOKOT) 8.6 MG tablet Take 1 tablet by  mouth at bedtime.    Marland Kitchen spironolactone (ALDACTONE) 25 MG tablet Take 25 mg by mouth daily.    . tamsulosin (FLOMAX) 0.4 MG CAPS capsule Take 0.4 mg by mouth daily.    . traMADol (ULTRAM) 50 MG tablet Take 1 tablet (50 mg total) by mouth every 6 (six) hours as needed. 30 tablet 0  . traZODone (DESYREL) 100 MG tablet Take 100 mg by mouth at bedtime as needed for sleep.     Marland Kitchen warfarin (COUMADIN) 5 MG tablet Take 5 mg by mouth as directed. M/w/f/ 7.5 mg then t/th/sa/sun is 5 mg daily by mouth or titrated as directed     No facility-administered medications prior to visit.      Allergies:   Diltiazem   Social History   Social History  . Marital  status: Married    Spouse name: N/A  . Number of children: N/A  . Years of education: N/A   Occupational History  . retired     Therapist, art x 5 years; Water engineer   Social History Main Topics  . Smoking status: Former Smoker    Quit date: 04/18/1966  . Smokeless tobacco: Former Systems developer  . Alcohol use No  . Drug use: No  . Sexual activity: Not Asked   Other Topics Concern  . None   Social History Narrative  . None     Family History:  The patient's family history includes Stroke in his mother.   ROS:   Please see the history of present illness.    Leg pain anterior thighs and knees bilaterally. "Fluid on the brain" due to cerebral atrophy based on neurosurgical opinion from [facility]. Basal cell carcinoma of the face.  All other systems reviewed and are negative.   PHYSICAL EXAM:   VS:  BP 126/64 (BP Location: Right Arm)   Pulse 65   Ht 5\' 7"  (1.702 m)   Wt 284 lb (128.8 kg)   BMI 44.48 kg/m    GEN: Well nourished, well developed, in no acute distress . Morbid obesity. HEENT: normal  Neck: no JVD, carotid bruits, or masses Cardiac: iiRR; no murmurs, rubs, or gallops,no edema  Respiratory:  clear to auscultation bilaterally, normal work of breathing GI: soft, nontender, nondistended, + BS MS: no deformity or atrophy  Skin: warm and dry, no rash Neuro:  Alert and Oriented x 3, Strength and sensation are intact Psych: euthymic mood, full affect  Wt Readings from Last 3 Encounters:  05/05/17 284 lb (128.8 kg)  08/05/16 288 lb (130.6 kg)  06/24/16 287 lb (130.2 kg)      Studies/Labs Reviewed:   EKG:  EKG  Atrial fibrillation with controlled rate. No evidence of Q-wave infarction. Stable when compared to last prior study.  Recent Labs: No results found for requested labs within last 8760 hours.   Lipid Panel No results found for: CHOL, TRIG, HDL, CHOLHDL, VLDL, LDLCALC, LDLDIRECT  Additional studies/ records that were reviewed today include:  Reviewed recent MRI  performed which revealed excess cerebral spinal fluid/hygroma. This is noted in the left brain. No surgical intervention was felt indicated.    ASSESSMENT:    1. Chronic atrial fibrillation ([place])   2. Chronic diastolic heart failure ([place])   3. Essential hypertension   4. OSA (obstructive sleep apnea)   5. Hypercholesteremia   6. Class 2 obesity due to excess calories without serious comorbidity with body mass index (BMI) of 38.0 to 38.9 in adult   7. Clear cell carcinoma  of left kidney ([place])      PLAN:  In order of problems listed above:  1. No bleeding complications. 2. Volume status appears stable. No change in diuretic regimen. 3. Blood pressures under excellent control. No change in therapy needed. 4. Continue positive airway pressure when sleeping. 5. Decrease saturated fat in diet and continue attempts at aerobic exercise. 6. Diet, exercise, decreased caloric intake. 7. Now nearly 2 years out without clinical evidence of recurrence.  From cardiac standpoint, we are stable. No change in therapy is needed. Clinical follow-up in one year.  Medication Adjustments/Labs and Tests Ordered: Current medicines are reviewed at length with the patient today.  Concerns regarding medicines are outlined above.  Medication changes, Labs and Tests ordered today are listed in the Patient Instructions below. Patient Instructions  Medication Instructions:  None  Labwork: None  Testing/Procedures: None  Follow-Up: Your physician wants you to follow-up in: 9-12 months with Dr. Tamala Julian.  You will receive a reminder letter in the mail two months in advance. If you don't receive a letter, please call our office to schedule the follow-up appointment.   Any Other Special Instructions Will Be Listed Below (If Applicable).     If you need a refill on your cardiac medications before your next appointment, please call your pharmacy.      Signed, Sinclair Grooms, MD  05/05/2017 11:48 AM     [address], [place], [place]  [address] Phone: [phone]; Fax: [phone]

## 2017-05-05 ENCOUNTER — Encounter: Payer: Self-pay | Admitting: Interventional Cardiology

## 2017-05-05 ENCOUNTER — Ambulatory Visit (INDEPENDENT_AMBULATORY_CARE_PROVIDER_SITE_OTHER): Payer: Medicare Other | Admitting: Interventional Cardiology

## 2017-05-05 VITALS — BP 126/64 | HR 65 | Ht 67.0 in | Wt 284.0 lb

## 2017-05-05 DIAGNOSIS — E7800 Pure hypercholesterolemia, unspecified: Secondary | ICD-10-CM

## 2017-05-05 DIAGNOSIS — G4733 Obstructive sleep apnea (adult) (pediatric): Secondary | ICD-10-CM | POA: Diagnosis not present

## 2017-05-05 DIAGNOSIS — I5032 Chronic diastolic (congestive) heart failure: Secondary | ICD-10-CM

## 2017-05-05 DIAGNOSIS — I10 Essential (primary) hypertension: Secondary | ICD-10-CM | POA: Diagnosis not present

## 2017-05-05 DIAGNOSIS — E6609 Other obesity due to excess calories: Secondary | ICD-10-CM

## 2017-05-05 DIAGNOSIS — Z6838 Body mass index (BMI) 38.0-38.9, adult: Secondary | ICD-10-CM

## 2017-05-05 DIAGNOSIS — C642 Malignant neoplasm of left kidney, except renal pelvis: Secondary | ICD-10-CM

## 2017-05-05 DIAGNOSIS — I482 Chronic atrial fibrillation, unspecified: Secondary | ICD-10-CM

## 2017-05-05 NOTE — Patient Instructions (Signed)
Medication Instructions:  None  Labwork: None  Testing/Procedures: None  Follow-Up: Your physician wants you to follow-up in: 9-12 months with Dr. Smith.  You will receive a reminder letter in the mail two months in advance. If you don't receive a letter, please call our office to schedule the follow-up appointment.   Any Other Special Instructions Will Be Listed Below (If Applicable).     If you need a refill on your cardiac medications before your next appointment, please call your pharmacy.   

## 2017-06-05 ENCOUNTER — Encounter: Payer: Self-pay | Admitting: Internal Medicine

## 2017-07-17 ENCOUNTER — Encounter: Payer: Self-pay | Admitting: *Deleted

## 2017-07-28 ENCOUNTER — Ambulatory Visit
Admission: RE | Admit: 2017-07-28 | Discharge: 2017-07-28 | Disposition: A | Discharge status: Home / Self Care | Payer: MEDICARE | Admitting: Urology

## 2017-07-28 ENCOUNTER — Ambulatory Visit: Admission: RE | Admit: 2017-07-28 | Discharge: 2017-07-28 | Disposition: A | Discharge status: Home / Self Care

## 2017-07-28 DIAGNOSIS — C642 Malignant neoplasm of left kidney, except renal pelvis: Principal | ICD-10-CM

## 2017-07-28 DIAGNOSIS — C649 Malignant neoplasm of unspecified kidney, except renal pelvis: Principal | ICD-10-CM

## 2017-08-09 ENCOUNTER — Ambulatory Visit: Payer: Medicare Other | Admitting: Internal Medicine

## 2017-08-24 ENCOUNTER — Ambulatory Visit
Admission: RE | Admit: 2017-08-24 | Discharge: 2017-08-24 | Disposition: A | Discharge status: Home / Self Care | Payer: MEDICARE | Attending: Hematology & Oncology | Admitting: Hematology & Oncology

## 2017-08-24 DIAGNOSIS — C649 Malignant neoplasm of unspecified kidney, except renal pelvis: Principal | ICD-10-CM

## 2017-08-24 DIAGNOSIS — C642 Malignant neoplasm of left kidney, except renal pelvis: Secondary | ICD-10-CM

## 2017-12-28 ENCOUNTER — Encounter: Admit: 2017-12-28 | Discharge: 2017-12-28 | Payer: MEDICARE

## 2017-12-28 ENCOUNTER — Encounter
Admit: 2017-12-28 | Discharge: 2017-12-28 | Payer: MEDICARE | Attending: Hematology & Oncology | Primary: Hematology & Oncology

## 2017-12-28 DIAGNOSIS — C649 Malignant neoplasm of unspecified kidney, except renal pelvis: Secondary | ICD-10-CM

## 2017-12-28 DIAGNOSIS — C642 Malignant neoplasm of left kidney, except renal pelvis: Principal | ICD-10-CM

## 2018-05-01 ENCOUNTER — Encounter: Admit: 2018-05-01 | Discharge: 2018-05-01 | Payer: MEDICARE

## 2018-05-01 ENCOUNTER — Ambulatory Visit: Admit: 2018-05-01 | Discharge: 2018-05-01 | Payer: MEDICARE

## 2018-05-01 ENCOUNTER — Encounter
Admit: 2018-05-01 | Discharge: 2018-05-01 | Payer: MEDICARE | Attending: Hematology & Oncology | Primary: Hematology & Oncology

## 2018-05-01 DIAGNOSIS — C642 Malignant neoplasm of left kidney, except renal pelvis: Principal | ICD-10-CM

## 2018-05-01 DIAGNOSIS — C772 Secondary and unspecified malignant neoplasm of intra-abdominal lymph nodes: Secondary | ICD-10-CM

## 2018-09-04 ENCOUNTER — Encounter
Admit: 2018-09-04 | Discharge: 2018-09-04 | Payer: MEDICARE | Attending: Hematology & Oncology | Primary: Hematology & Oncology

## 2018-09-04 ENCOUNTER — Encounter: Admit: 2018-09-04 | Discharge: 2018-09-04 | Payer: MEDICARE

## 2018-09-04 DIAGNOSIS — C642 Malignant neoplasm of left kidney, except renal pelvis: Principal | ICD-10-CM

## 2018-11-13 ENCOUNTER — Ambulatory Visit: Payer: Medicare Other | Admitting: Interventional Cardiology

## 2018-11-13 ENCOUNTER — Encounter: Payer: Self-pay | Admitting: Interventional Cardiology

## 2018-11-13 VITALS — BP 120/70 | HR 64 | Ht 67.0 in | Wt 271.6 lb

## 2018-11-13 DIAGNOSIS — I5032 Chronic diastolic (congestive) heart failure: Secondary | ICD-10-CM

## 2018-11-13 DIAGNOSIS — E6609 Other obesity due to excess calories: Secondary | ICD-10-CM

## 2018-11-13 DIAGNOSIS — I482 Chronic atrial fibrillation, unspecified: Secondary | ICD-10-CM | POA: Diagnosis not present

## 2018-11-13 DIAGNOSIS — I10 Essential (primary) hypertension: Secondary | ICD-10-CM

## 2018-11-13 DIAGNOSIS — G4733 Obstructive sleep apnea (adult) (pediatric): Secondary | ICD-10-CM | POA: Diagnosis not present

## 2018-11-13 DIAGNOSIS — E7800 Pure hypercholesterolemia, unspecified: Secondary | ICD-10-CM

## 2018-11-13 DIAGNOSIS — Z6838 Body mass index (BMI) 38.0-38.9, adult: Secondary | ICD-10-CM
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
## 2018-11-13 NOTE — Patient Instructions (Signed)

## 2019-01-04 ENCOUNTER — Ambulatory Visit (INDEPENDENT_AMBULATORY_CARE_PROVIDER_SITE_OTHER): Payer: Medicare Other

## 2019-01-04 ENCOUNTER — Other Ambulatory Visit: Payer: Self-pay | Admitting: Podiatry

## 2019-01-04 ENCOUNTER — Ambulatory Visit: Payer: Medicare Other | Admitting: Podiatry

## 2019-01-04 ENCOUNTER — Encounter: Payer: Self-pay | Admitting: Podiatry

## 2019-01-04 DIAGNOSIS — M109 Gout, unspecified: Secondary | ICD-10-CM | POA: Diagnosis not present

## 2019-01-04 DIAGNOSIS — M7751 Other enthesopathy of right foot: Secondary | ICD-10-CM | POA: Diagnosis not present

## 2019-01-04 DIAGNOSIS — M79671 Pain in right foot: Secondary | ICD-10-CM

## 2019-01-04 DIAGNOSIS — M10071 Idiopathic gout, right ankle and foot: Secondary | ICD-10-CM | POA: Diagnosis not present

## 2019-01-04 MED ORDER — METHYLPREDNISOLONE 4 MG PO TBPK: ORAL_TABLET | ORAL | 0 refills | Status: DC
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
## 2019-01-04 NOTE — Patient Instructions (Signed)

## 2019-01-09 ENCOUNTER — Encounter: Admit: 2019-01-09 | Discharge: 2019-01-09 | Payer: MEDICARE

## 2019-01-09 DIAGNOSIS — C642 Malignant neoplasm of left kidney, except renal pelvis: Principal | ICD-10-CM

## 2019-01-10 ENCOUNTER — Ambulatory Visit
Admit: 2019-01-10 | Discharge: 2019-01-11 | Payer: MEDICARE | Attending: Hematology & Oncology | Primary: Hematology & Oncology

## 2019-01-10 DIAGNOSIS — C642 Malignant neoplasm of left kidney, except renal pelvis: Principal | ICD-10-CM

## 2019-01-10 DIAGNOSIS — C772 Secondary and unspecified malignant neoplasm of intra-abdominal lymph nodes: Secondary | ICD-10-CM

## 2019-01-16 ENCOUNTER — Telehealth: Payer: Self-pay | Admitting: Interventional Cardiology

## 2019-01-16 NOTE — Telephone Encounter (Signed)
Anderson Malta, I believe this is being sent to you as that the scheduler could not find a time slot. Please see message from the pt.

## 2019-01-16 NOTE — Telephone Encounter (Signed)
New Message    Patient and wife is calling because they want to schedule an appointment to come and see Dr. Tamala Julian to discuss his cancer treatments prior to the 12th of Feb. He states that the oncologist is wanting to change him from coumadin to maybe Eliquis. He wants to make sure that everyone is on the same page. Please call to discuss.

## 2019-01-17 NOTE — Telephone Encounter (Signed)
Left message to call back  

## 2019-01-17 NOTE — Telephone Encounter (Signed)
I would prefer Eliquis to Coumadin any day for atrial fibrillation.  He should change to Eliquis if this is what oncology prefers.  He will decrease the risk of bleeding.

## 2019-01-17 NOTE — Telephone Encounter (Signed)
Dr. Tamala Julian- pt ok to switch from coumadin to Eliquis if the oncologist prefers?  I can bring him in if you feel necessary.

## 2019-01-22 NOTE — Telephone Encounter (Signed)
Left message to call back  

## 2019-01-22 NOTE — Telephone Encounter (Signed)
Spoke with pt and made him aware of recommendations per Dr. Tamala Julian.  Pt will make Oncologist aware and let us know if we need to do anything further.  Pt appreciative for assistance.

## 2019-04-03 ENCOUNTER — Encounter: Admit: 2019-04-03 | Discharge: 2019-04-03 | Payer: MEDICARE

## 2019-04-03 ENCOUNTER — Ambulatory Visit: Admit: 2019-04-03 | Discharge: 2019-04-03 | Payer: MEDICARE

## 2019-04-03 DIAGNOSIS — C772 Secondary and unspecified malignant neoplasm of intra-abdominal lymph nodes: Secondary | ICD-10-CM

## 2019-04-03 DIAGNOSIS — C642 Malignant neoplasm of left kidney, except renal pelvis: Principal | ICD-10-CM

## 2019-04-04 ENCOUNTER — Encounter
Admit: 2019-04-04 | Discharge: 2019-04-05 | Payer: MEDICARE | Attending: Hematology & Oncology | Primary: Hematology & Oncology

## 2019-04-04 DIAGNOSIS — C642 Malignant neoplasm of left kidney, except renal pelvis: Principal | ICD-10-CM

## 2019-04-04 DIAGNOSIS — C772 Secondary and unspecified malignant neoplasm of intra-abdominal lymph nodes: Secondary | ICD-10-CM

## 2019-05-01 ENCOUNTER — Telehealth: Payer: Self-pay | Admitting: Interventional Cardiology

## 2019-05-02 NOTE — Telephone Encounter (Signed)
Disregard opened in error °

## 2019-07-10 ENCOUNTER — Encounter: Admit: 2019-07-10 | Discharge: 2019-07-10 | Payer: MEDICARE

## 2019-07-10 DIAGNOSIS — C642 Malignant neoplasm of left kidney, except renal pelvis: Principal | ICD-10-CM

## 2019-07-10 DIAGNOSIS — C772 Secondary and unspecified malignant neoplasm of intra-abdominal lymph nodes: Secondary | ICD-10-CM

## 2019-07-10 DIAGNOSIS — Z79899 Other long term (current) drug therapy: Secondary | ICD-10-CM

## 2019-07-11 DIAGNOSIS — Z79899 Other long term (current) drug therapy: Secondary | ICD-10-CM

## 2019-07-11 DIAGNOSIS — C772 Secondary and unspecified malignant neoplasm of intra-abdominal lymph nodes: Secondary | ICD-10-CM

## 2019-07-11 DIAGNOSIS — C642 Malignant neoplasm of left kidney, except renal pelvis: Principal | ICD-10-CM
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
## 2019-07-12 ENCOUNTER — Encounter
Admit: 2019-07-12 | Discharge: 2019-07-12 | Payer: MEDICARE | Attending: Hematology & Oncology | Primary: Hematology & Oncology

## 2019-07-15 NOTE — Unmapped (Signed)
Returned call to pt. States he had visit with Dr. Philomena Course 07-11-19. He is going to be started on Pembro and axitinib. He is wanting to get info on theses meds  And is requesting a call from [facility] and pharmacist if possible. He has been unable to access his [facility] my chart and is requesting labs, scans, and office notes from last week be mailed to him.Told him I would notify team of his request and will mail info he has requested.He was very Adult nurse.

## 2019-07-15 NOTE — Unmapped (Signed)
Hi,     Patient contacted the Communication Center requesting to speak with the care team of Eloy Fehl to discuss:    Requesting a call back from [place], patient stated that he has some questions about his first treatment on 07/25/2019.    Please contact [place] at [phone].    Check Indicates criteria has been reviewed and confirmed with the patient:    [x]  Preferred Name   [x]  DOB and/or MR#  [x]  Preferred Contact Method  [x]  Phone Number(s)   []  MyChart     Thank you,   Jannette Spanner  [facility] Communication Center   [phone]

## 2019-07-17 MED ORDER — PROCHLORPERAZINE MALEATE 10 MG TABLET
ORAL_TABLET | Freq: Four times a day (QID) | ORAL | 3 refills | 15.00000 days | Status: CP | PRN
Start: 2019-07-17 — End: ?

## 2019-07-17 MED ORDER — LOPERAMIDE 2 MG CAPSULE
ORAL_CAPSULE | prn refills | 0 days | Status: CP
Start: 2019-07-17 — End: ?

## 2019-07-17 MED ORDER — AXITINIB 5 MG TABLET
ORAL_TABLET | Freq: Two times a day (BID) | ORAL | 11 refills | 30 days | Status: CP
Start: 2019-07-17 — End: ?

## 2019-07-19 NOTE — Unmapped (Signed)
Contacted patient by phone regarding question related to [place]. Copay of medication cost-prohibitive (~$3,000) so medication assistance team attempted to reach patient yesterday. Patient plans to call medication assistance team member back this morning to follow up and continue with process of medication access. He is also interested if it may be an option to obtain through a different pharmacy with reduced cost given his coverage through the [place].    Patient aware [facility] medical oncology CPP will plan to reach out next week with updates and further education.    Patient is aware and was appreciative of the information.    Care coordination: 10 minutes    Konrad Penta, PharmD, BCOP, CPP  Clinical Pharmacist Practitioner, Gastrointestinal Oncology  Pager: [phone]

## 2019-07-24 NOTE — Unmapped (Signed)
Hi,     Patient contacted the Communication Center regarding the following:    - Requesting to cancel all appointments for 07/25/2019 and reschedule.     Please contact [place]ran at [phone].    Thanks in advance,    Jannette Spanner  [facility] Communication Center   [phone]

## 2019-07-24 NOTE — Unmapped (Signed)
Returned Vincent Turner call, left message for him to call back.    Thanks,  [employer]

## 2019-07-25 DIAGNOSIS — C642 Malignant neoplasm of left kidney, except renal pelvis: Principal | ICD-10-CM

## 2019-07-25 DIAGNOSIS — C772 Secondary and unspecified malignant neoplasm of intra-abdominal lymph nodes: Secondary | ICD-10-CM

## 2019-07-25 NOTE — Unmapped (Signed)
Left message for Mr. Vincent Turner to return call in reference to his 12pm appt scheduled with Dr. Philomena Course on 8/13. This visit will be in person.    [name]

## 2019-07-25 NOTE — Unmapped (Signed)
Reviewed Meds and Allergies, confirmed pharmacy and informed pt Provider would be calling him shortly for PHONE visit.
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
## 2019-07-26 ENCOUNTER — Encounter: Admit: 2019-07-26 | Discharge: 2019-07-26 | Payer: MEDICARE

## 2019-07-26 ENCOUNTER — Encounter
Admit: 2019-07-26 | Discharge: 2019-07-26 | Payer: MEDICARE | Attending: Hematology & Oncology | Primary: Hematology & Oncology

## 2019-07-29 NOTE — Unmapped (Signed)
Left message for pt to return call in reference to his rescheduled appt. Pt's appt rescheduled from 8/13 to 8/20 as requested.    If Mr. Vincent Turner calls back please notify him of appt changes.    [name]

## 2019-08-08 ENCOUNTER — Encounter
Admit: 2019-08-08 | Discharge: 2019-08-08 | Payer: MEDICARE | Attending: Hematology & Oncology | Primary: Hematology & Oncology

## 2019-08-08 ENCOUNTER — Encounter: Admit: 2019-08-08 | Discharge: 2019-08-08 | Payer: MEDICARE

## 2019-08-08 DIAGNOSIS — C772 Secondary and unspecified malignant neoplasm of intra-abdominal lymph nodes: Secondary | ICD-10-CM

## 2019-08-08 DIAGNOSIS — C642 Malignant neoplasm of left kidney, except renal pelvis: Principal | ICD-10-CM

## 2019-08-08 LAB — EGFR CKD-EPI AA MALE: Lab: 85

## 2019-08-08 LAB — CBC W/ AUTO DIFF
BASOPHILS ABSOLUTE COUNT: 0.1 10*9/L (ref 0.0–0.1)
BASOPHILS RELATIVE PERCENT: 0.8 %
EOSINOPHILS ABSOLUTE COUNT: 0.4 10*9/L (ref 0.0–0.4)
EOSINOPHILS RELATIVE PERCENT: 3.8 %
HEMATOCRIT: 41.9 % (ref 41.0–53.0)
HEMOGLOBIN: 13.6 g/dL (ref 13.5–17.5)
LYMPHOCYTES ABSOLUTE COUNT: 1.1 10*9/L — ABNORMAL LOW (ref 1.5–5.0)
LYMPHOCYTES RELATIVE PERCENT: 11 %
MEAN CORPUSCULAR HEMOGLOBIN CONC: 32.4 g/dL (ref 31.0–37.0)
MEAN CORPUSCULAR HEMOGLOBIN: 30.5 pg (ref 26.0–34.0)
MEAN CORPUSCULAR VOLUME: 94.1 fL (ref 80.0–100.0)
MEAN PLATELET VOLUME: 7.4 fL (ref 7.0–10.0)
MONOCYTES ABSOLUTE COUNT: 0.6 10*9/L (ref 0.2–0.8)
MONOCYTES RELATIVE PERCENT: 6.2 %
NEUTROPHILS ABSOLUTE COUNT: 7.4 10*9/L (ref 2.0–7.5)
NEUTROPHILS RELATIVE PERCENT: 76.7 %
PLATELET COUNT: 367 10*9/L (ref 150–440)
RED BLOOD CELL COUNT: 4.45 10*12/L — ABNORMAL LOW (ref 4.50–5.90)
RED CELL DISTRIBUTION WIDTH: 14.6 % (ref 12.0–15.0)

## 2019-08-08 LAB — COMPREHENSIVE METABOLIC PANEL
ALBUMIN: 3.6 g/dL (ref 3.5–5.0)
ALKALINE PHOSPHATASE: 69 U/L (ref 38–126)
ANION GAP: 8 mmol/L (ref 7–15)
AST (SGOT): 17 U/L — ABNORMAL LOW (ref 19–55)
BILIRUBIN TOTAL: 0.8 mg/dL (ref 0.0–1.2)
BUN / CREAT RATIO: 15
CALCIUM: 10.1 mg/dL (ref 8.5–10.2)
CHLORIDE: 104 mmol/L (ref 98–107)
CO2: 25 mmol/L (ref 22.0–30.0)
CREATININE: 0.98 mg/dL (ref 0.70–1.30)
EGFR CKD-EPI AA MALE: 85 mL/min/{1.73_m2} (ref >=60)
EGFR CKD-EPI NON-AA MALE: 74 mL/min/{1.73_m2} (ref >=60)
GLUCOSE RANDOM: 109 mg/dL (ref 70–179)
POTASSIUM: 4.8 mmol/L (ref 3.5–5.0)
PROTEIN TOTAL: 7.2 g/dL (ref 6.5–8.3)
SODIUM: 137 mmol/L (ref 135–145)

## 2019-08-08 LAB — SMEAR REVIEW

## 2019-08-08 LAB — MEAN PLATELET VOLUME: Lab: 7.4

## 2019-08-08 NOTE — Unmapped (Addendum)
Patient Education        ipilimumab  Pronunciation:  IP i LIM ue mab  BrandArthur Holms  What is the most important information I should know about ipilimumab?  Serious and sometimes fatal reactions may occur during treatment with ipilimumab, or months after stopping. Call your doctor right away if you have symptoms such as: stomach pain, diarrhea, bloody or tarry stools, dark urine, yellowing of your skin or eyes, neck stiffness, headache, confusion, mood or behavior changes, vision problems, muscle weakness, numbness or tingling, trouble with daily activities, chest pain, cough, or shortness of breath.  What is ipilimumab?  Ipilimumab is a cancer medicine that interferes with the growth and spread of cancer cells in the body.  Ipilimumab is used to treat melanoma (skin cancer) that cannot be treated with surgery or has spread to other parts of the body. Ipilimumab is also used to prevent melanoma from coming back after surgery, including lymph node removal surgery.  Ipilimumab is also used to treat kidney cancer, sometimes given with another medicine called nivolumab (Opdivo).  Ipilimumab is also used to treat colorectal cancer that has spread to other parts of the body, that has certain specific DNA mutations, and that has not responded to chemotherapy with other medicines.  Ipilimumab may also be used for purposes not listed in this medication guide.  What should I discuss with my healthcare provider before receiving ipilimumab?  You should not receive ipilimumab if you are allergic to it.  Tell your doctor if you have ever had:  ?? liver damage caused by disease or by using certain medicines;  ?? an autoimmune disorder such as lupus or sarcoidosis;  ?? Crohn's disease or ulcerative colitis; or  ?? an organ transplant.  Ipilimumab may harm an unborn baby. Use effective birth control to prevent pregnancy while you are using this medicine and for at least 3 months after your last dose. Tell your doctor if you become pregnant.  In animal studies, ipilimumab caused miscarriage, premature delivery, low birth weight, stillbirth, and infant death. However, it is not known whether these effects would occur in humans. Ask your doctor about your risk.  You should not breast-feed while you are receiving ipilimumab and for at least 3 months after your last dose.  Ipilimumab is not approved for use by anyone younger than 78 years old.  How is ipilimumab given?  Ipilimumab is given as an infusion into a vein. A healthcare provider will give you this injection.  This medicine must be given slowly, and the infusion can take up to 90 minutes to complete.  Ipilimumab is usually given once every 3 weeks for up to 4 doses. Additional doses may be given once every 2 to 12 weeks. Follow your doctor's dosing instructions very carefully.  You may be given other medications to treat or prevent certain side effects.  You may need frequent medical tests to be sure this medicine is not causing harmful effects. Your cancer treatments may be delayed based on the results of these tests.  What happens if I miss a dose?  Call your doctor for instructions if you miss an appointment for your ipilimumab injection.  What happens if I overdose?  Since ipilimumab is given by a healthcare professional in a medical setting, an overdose is unlikely to occur.  What should I avoid while receiving ipilimumab?  Follow your doctor's instructions about any restrictions on food, beverages, or activity.  What are the possible side effects of ipilimumab?  Get emergency medical help if you have signs of an allergic reaction (hives, difficult breathing, swelling in your face or throat) or a severe skin reaction (fever, sore throat, burning eyes, skin pain, red or purple skin rash with blistering and peeling).  Some side effects may occur during the injection. Tell your caregiver if you feel dizzy, itchy, warm, tingly, feverish, chilled, or light-headed.  Serious and sometimes fatal reactions may occur during treatment with ipilimumab or months after stopping. Call your doctor right away if you have symptoms such as:  ?? stomach pain, nausea, vomiting, loss of appetite, diarrhea, bloody or tarry stools;  ?? dark urine, jaundice (yellowing of the skin or eyes);  ?? fever, neck stiffness, headache, feeling cold or tired;  ?? mood or behavior changes, dizziness, drowsiness, confusion, hallucinations;  ?? a seizure;  ?? memory problems, trouble with daily activities;  ?? muscle weakness, numbness or tingling;  ?? new or worsening cough, chest pain, shortness of breath;  ?? little or no urination, swelling in your ankles, blood in your urine; or  ?? eye pain or vision problems.  Your cancer treatments may be delayed or permanently discontinued if you have certain side effects.  Common side effects may include:  ?? fever, cough;  ?? nausea, diarrhea, loss of appetite, weight loss;  ?? rash or itching;  ?? headache, tiredness; or  ?? pain in your muscles, joints, or bones.  This is not a complete list of side effects and others may occur. Call your doctor for medical advice about side effects. You may report side effects to FDA at 1-800-FDA-1088.  What other drugs will affect ipilimumab?  Other drugs may affect ipilimumab, including prescription and over-the-counter medicines, vitamins, and herbal products. Tell your doctor about all your current medicines and any medicine you start or stop using.  Where can I get more information?  Your pharmacist can provide more information about ipilimumab.  Remember, keep this and all other medicines out of the reach of children, never share your medicines with others, and use this medication only for the indication prescribed.   Every effort has been made to ensure that the information provided by [employer] ('[employer]') is accurate, up-to-date, and complete, but no guarantee is made to that effect. Drug information contained herein may be time sensitive. [employer] information has been compiled for use by healthcare practitioners and consumers in the [place] and therefore [employer] does not warrant that uses outside of the [place] are appropriate, unless specifically indicated otherwise. [employer]'s drug information does not endorse drugs, diagnose patients or recommend therapy. [employer]'s drug information is an [employer], corporate to assist licensed healthcare practitioners in caring for their patients and/or to serve consumers viewing this service as a supplement to, and not a substitute for, the expertise, skill, knowledge and judgment of healthcare practitioners. The absence of a warning for a given drug or drug combination in no way should be construed to indicate that the drug or drug combination is safe, effective or appropriate for any given patient. [employer] does not assume any responsibility for any aspect of healthcare administered with the aid of information [employer] provides. The information contained herein is not intended to cover all possible uses, directions, precautions, warnings, drug interactions, allergic reactions, or adverse effects. If you have questions about the drugs you are taking, check with your doctor, nurse or pharmacist.  Copyright [phone] [employer] Version: 10.01. Revision date: 09/11/2017.  Care instructions adapted under license by [facility]  [facility]. If you have questions about a medical condition or this instruction, always ask your healthcare professional. [employer] disclaims any warranty or liability for your use of this information.     Patient Education        nivolumab  Pronunciation:  nye VOL ue mab  Brand:  Opdivo  What is the most important information I should know about nivolumab?  Nivolumab can cause side effects in many different parts of your body.  Some side effects may need to be treated with other medicine, and your cancer treatments may be delayed.  Call your doctor at once if you have: chest pain, cough, shortness of breath, vision changes, severe muscle pain or weakness, diarrhea and severe stomach pain, blood in your stools, little or no urinating, swelling, bruising or bleeding, dark urine, yellowing of the skin or eyes, confusion, hallucinations, a seizure, skin blistering, sores in your mouth, nose, rectum, or genitals, or a hormonal disorder (frequent headaches, feeling light-headed, increased thirst or urination, a deeper voice, feeling cold, weight gain or loss).  What is nivolumab?  Nivolumab is a cancer medicine that is used alone or in combination with other medicines to treat:  ?? advanced skin cancer (melanoma);  ?? non-small cell lung cancer;  ?? kidney cancer;  ?? classical Hodgkin lymphoma;  ?? squamous cell cancer of the head and neck;  ?? bladder cancer;  ?? liver cancer; or  ?? a type of colorectal cancer that laboratory testing proves to have certain specific DNA mutations.  Nivolumab is often given when the cancer has spread to other parts of the body, or cannot be surgically removed, or has come back after prior treatment.  For some types of cancer, nivolumab is given only if your tumor has a specific genetic marker (an abnormal EGFR or ALK gene).  Nivolumab may also be used for purposes not listed in this medication guide.  What should I discuss with my healthcare provider before receiving nivolumab?  You should not use nivolumab if you are allergic to it.  Tell your doctor if you have ever had:  ?? lung disease or breathing problems;  ?? liver disease;  ?? cytomegalovirus;  ?? an autoimmune disorder (lupus, Crohn's disease, ulcerative colitis); or  ?? an organ transplant, or a stem cell transplant from a donor.  You may need to have a negative pregnancy test before starting this treatment.  Do not use nivolumab if you are pregnant. It could harm the unborn baby. Use effective birth control to prevent pregnancy while you are using this medicine and for at least 5 months after your last dose.  Do not breastfeed while using this medicine,  and for at least 5 months after your last dose.  How is nivolumab given?  Your doctor will perform tests to make sure nivolumab is the best treatment for your type of cancer.  Nivolumab is given as an infusion into a vein by a healthcare provider. This medicine must be given slowly, and the infusion can take at least 30 minutes to complete.  Nivolumab is usually given once every 2 to 4 weeks. Your doctor will determine how long to treat you with this medicine.  You may be given medication to treat or prevent certain side effects of nivolumab.  Nivolumab can cause side effects in many parts of your body by changing how your immune system works. Some side effects may be treated with other medicine, and your cancer treatments may be delayed or stopped.  You  will need frequent medical tests to help your doctor determine if it is safe for you to keep receiving nivolumab.   What happens if I miss a dose?  Call your doctor for instructions if you miss an appointment for your nivolumab injection.  What happens if I overdose?  Seek emergency medical attention or call the Poison Help line at [phone].  What should I avoid while receiving nivolumab?  Follow your doctor's instructions about any restrictions on food, beverages, or activity.  What are the possible side effects of nivolumab?  Get emergency medical help if you have signs of an allergic reaction (hives, difficult breathing, swelling in your face or throat) or a severe skin reaction (fever, sore throat, burning eyes, skin pain, red or purple skin rash with blistering and peeling).  Some side effects may occur during the injection. Tell your caregiver right away if you feel dizzy, light-headed, short of breath, itchy, tingly, chilled, or feverish.  Call your doctor at once if you have:  ?? severe or ongoing diarrhea, severe stomach pain, bloody or tarry stools;  ?? new or worsening skin rash, itching, or blistering;  ?? sores or ulcers in your mouth, nose, rectum, or genitals;  ?? changes in your vision;  ?? severe muscle weakness, ongoing pain in your muscles or joints;  ?? (if you have had a stem cell transplant) feeling sick or uneasy, with pain or swelling near your transplanted organ;  ?? lung problems --new or worsening cough, chest pain, feeling short of breath;  ?? symptoms of brain swelling --confusion, headache, memory problems, hallucinations, neck stiffness, drowsiness, seizure (convulsions);  ?? kidney problems --little or no urinating; blood in your urine; swelling in your feet or ankles;  ?? liver problems --severe nausea or vomiting, right-sided upper stomach pain, lack of energy, easy bruising or bleeding, dark urine, jaundice (yellowing of the skin or eyes); or  ?? signs of a hormonal disorder --frequent or unusual headaches, dizziness, fainting, mood or behavior changes, increased thirst or urination, constipation, hair loss, hoarse or deepened voice, feeling cold, weight gain, or weight loss.  Your cancer treatments may be delayed or permanently discontinued if you have certain side effects.  Common side effects may include:  ?? nausea, vomiting, stomach pain, loss of appetite, diarrhea, constipation;  ?? feeling weak, tired, or short of breath;  ?? cold symptoms such as runny or stuffy nose, cough, sore throat;  ?? fever, body aches;  ?? skin rash, itching; or  ?? headache, back pain.  This is not a complete list of side effects and others may occur. Call your doctor for medical advice about side effects. You may report side effects to FDA at 1-800-FDA-1088.  What other drugs will affect nivolumab?  Other drugs may affect nivolumab, including prescription and over-the-counter medicines, vitamins, and herbal products. Tell your doctor about all your current medicines and any medicine you start or stop using.  Where can I get more information?  Your pharmacist can provide more information about nivolumab. Remember, keep this and all other medicines out of the reach of children, never share your medicines with others, and use this medication only for the indication prescribed.   Every effort has been made to ensure that the information provided by [employer]') is accurate, up-to-date, and complete, but no guarantee is made to that effect. Drug information contained herein may be time sensitive. [employer] information has been compiled for use by healthcare practitioners and consumers in the [place] and  therefore [employer] does not warrant that uses outside of the [place] are appropriate, unless specifically indicated otherwise. [employer]'s drug information does not endorse drugs, diagnose patients or recommend therapy. [employer]'s drug information is an [employer] corporate to assist licensed healthcare practitioners in caring for their patients and/or to serve consumers viewing this service as a supplement to, and not a substitute for, the expertise, skill, knowledge and judgment of healthcare practitioners. The absence of a warning for a given drug or drug combination in no way should be construed to indicate that the drug or drug combination is safe, effective or appropriate for any given patient. [employer] does not assume any responsibility for any aspect of healthcare administered with the aid of information [employer] provides. The information contained herein is not intended to cover all possible uses, directions, precautions, warnings, drug interactions, allergic reactions, or adverse effects. If you have questions about the drugs you are taking, check with your doctor, nurse or pharmacist.  Copyright [phone] [employer] Version: 13.01. Revision date: 10/22/2018.  Care instructions adapted under license by [facility]. If you have questions about a medical condition or this instruction, always ask your healthcare professional. [employer] disclaims any warranty or liability for your use of this information.

## 2019-08-08 NOTE — Unmapped (Signed)
Called and spoke with pt's wife.Let her know Dr. Philomena Course wanted me to let them know pt's lab work today was good especially his Calcium which was normal.She was very appreciative.
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
## 2019-08-08 NOTE — Unmapped (Signed)
Labs drawn via venipuncture, sent for analysis.

## 2019-08-12 ENCOUNTER — Telehealth: Payer: Self-pay | Admitting: Oncology

## 2019-08-12 NOTE — Telephone Encounter (Signed)
Received a new patient referral from Dr. Alyson Ingles for metastatic renal cell carcinoma. Evan Kirk has been cld and scheduled to see Dr. Joelyn Oms on 8/26 at 2pm. He's aware to arrive 20 minutes early.

## 2019-08-14 ENCOUNTER — Other Ambulatory Visit: Payer: Self-pay

## 2019-08-14 ENCOUNTER — Inpatient Hospital Stay: Payer: Medicare Other | Attending: Oncology | Admitting: Oncology

## 2019-08-14 VITALS — BP 140/82 | HR 68 | Temp 98.9°F | Resp 17 | Ht 67.0 in | Wt 250.0 lb

## 2019-08-14 DIAGNOSIS — C642 Malignant neoplasm of left kidney, except renal pelvis: Secondary | ICD-10-CM | POA: Diagnosis not present

## 2019-08-14 DIAGNOSIS — I4891 Unspecified atrial fibrillation: Secondary | ICD-10-CM

## 2019-08-14 DIAGNOSIS — I509 Heart failure, unspecified: Secondary | ICD-10-CM | POA: Diagnosis not present

## 2019-08-14 DIAGNOSIS — Z87891 Personal history of nicotine dependence: Secondary | ICD-10-CM

## 2019-08-14 DIAGNOSIS — Z905 Acquired absence of kidney: Secondary | ICD-10-CM | POA: Diagnosis not present

## 2019-08-14 NOTE — Progress Notes (Signed)
Reason for the request: Renal cell carcinoma  HPI: I was asked by Dr. Alyson Ingles to evaluate Evan Kirk for renal cell carcinoma.  He is a 78 year old man with history of atrial fibrillation, congestive heart failure and renal cell carcinoma diagnosed in 2016.  At that time he presented with hematuria found to have left kidney mass.  He underwent left radical nephrectomy performed by Dr. 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  His most recent CT scan on July 10, 2019 showed enlargement of his aortocaval lymph node measuring 6 x 4.7 x 9.1 cm which was previously 5.1 x 4.3 x 7.8.  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  No other areas of metastasis noted at that time.  He has been offered treatment utilizing axitinib and Pembrolizumab with his oncologist at [facility].  He has not started this therapy at this time and contemplating whether to proceed with any therapy at all.  He is evaluating the option of getting treated locally and possibly via the [place].  He does not report any symptoms at this time.  He denies any abdominal pain discomfort.  He denies any hematuria or dysuria.  He does not report any headaches, blurry vision, syncope or seizures. Does not report any fevers, chills or sweats.  Does not report any cough, wheezing or hemoptysis.  Does not report any chest pain, palpitation, orthopnea or leg edema.  Does not report any nausea, vomiting or abdominal pain.  Does not report any constipation or diarrhea.  Does not report any skeletal complaints.    Does not report frequency, urgency or hematuria.  Does not report any skin rashes or lesions. Does not report any heat or cold intolerance.  Does not report any  lymphadenopathy or petechiae.  Does not report any anxiety or depression.  Remaining review of systems is negative.    Past Medical History:  Diagnosis Date  . Anticoagulated on Coumadin    followed by [place] [facility]  . Atrial fibrillation ([place])   . Back pain   . Bladder mass   . BPH (benign prostatic hypertrophy)   . Chronic atrial fibrillation   . Diastolic CHF, chronic [facility])    cardiologist-- dr Daneen Schick-- asymptomatic  . Diverticulosis of colon   . Edema   . Essential tremor 06-2015  . History of urinary retention   . HTN (hypertension)   . OSA on CPAP   . Peripheral neuropathy    BILATERAL FEET  . Prediabetes   . PTSD (post-traumatic stress disorder)   . Renal cell carcinoma of left kidney [facility])   :  Past Surgical History:  Procedure Laterality Date  . CYSTOSCOPY WITH BIOPSY N/A 10/09/2015   Procedure: CYSTOSCOPY WITH BIOPSY;  Surgeon: Cleon Gustin, MD;  Location: [facility];  Service: Urology;  Laterality: N/A;  . NEPHRECTOMY Left   . NEPHRECTOMY RADICAL Left 08/13/2015   [facility]-  . TOTAL KNEE ARTHROPLASTY Right 11-10-2008  . TRANSTHORACIC ECHOCARDIOGRAM  08-12-2015      moderate concentric LVH, ef 50-55-%/  trivial MR , PR and TR/  severe LAE and RAE  . VASECTOMY REVERSAL  1978  :   Current Outpatient Medications:  .  acetaminophen (TYLENOL)  500 MG tablet, Take 500 mg by mouth every 6 (six) hours as needed., Disp: , Rfl:  .  benzoyl peroxide 5 % gel, Apply topically daily., Disp: , Rfl:  .  carboxymethylcellulose (REFRESH PLUS) 0.5 % SOLN, 1 drop 3 (three) times daily as needed., Disp: , Rfl:  .  cholecalciferol (VITAMIN D) 1000 UNITS tablet, Take 2,000 Units by mouth daily., Disp: , Rfl:  .  docusate sodium (COLACE) 100 MG capsule, Take 100 mg by mouth 2 (two) times daily., Disp: , Rfl:  .  hydrocortisone 2.5 % lotion, Apply topically 2 (two) times daily., Disp: , Rfl:  .  ketoconazole (NIZORAL) 2 % shampoo, Apply 1 application  topically 2 (two) times a week., Disp: , Rfl:  .  loratadine (CLARITIN) 10 MG tablet, Take 10 mg by mouth daily as needed for allergies. , Disp: , Rfl:  .  methylPREDNISolone (MEDROL DOSEPAK) 4 MG TBPK tablet, Take as directed, Disp: 21 tablet, Rfl: 0 .  metoprolol (LOPRESSOR) 50 MG tablet, Take 0.5 tablets (25 mg total) by mouth 2 (two) times daily., Disp: 30 tablet, Rfl: 6 .  metroNIDAZOLE (METROCREAM) 0.75 % cream, Apply 1 application topically daily as needed. (roscea), Disp: , Rfl:  .  Multiple Vitamin (MULTIVITAMIN) capsule, Take 1 capsule by mouth daily., Disp: , Rfl:  .  OVER THE COUNTER MEDICATION, Take 2 capsules by mouth at bedtime. Med Name: INTESTINAL FORMULA #1, Disp: , Rfl:  .  senna (SENOKOT) 8.6 MG tablet, Take 1 tablet by mouth at bedtime., Disp: , Rfl:  .  spironolactone (ALDACTONE) 25 MG tablet, Take 25 mg by mouth daily., Disp: , Rfl:  .  tamsulosin (FLOMAX) 0.4 MG CAPS capsule, Take 0.4 mg by mouth daily., Disp: , Rfl:  .  traMADol (ULTRAM) 50 MG tablet, Take 1 tablet (50 mg total) by mouth every 6 (six) hours as needed., Disp: 30 tablet, Rfl: 0 .  traZODone (DESYREL) 100 MG tablet, Take 100 mg by mouth at bedtime as needed for sleep. , Disp: , Rfl:  .  warfarin (COUMADIN) 5 MG tablet, Take 5 mg by mouth as directed. M/w/f/ 7.5 mg then t/th/sa/sun is 5 mg daily by mouth or titrated as directed, Disp: , Rfl: :  Allergies  Allergen Reactions  . Diltiazem Other (See Comments)    Pt didn't feel well   :  Family History  Problem Relation Age of Onset  . Stroke Mother   :  Social History   Socioeconomic History  . Marital status: Married    Spouse name: Not on file  . Number of children: Not on file  . Years of education: Not on file  . Highest education level: Not on file  Occupational History  . Occupation: retired    Comment: Therapist, art x 5 years; Water engineer  Social Needs  . Financial resource strain: Not on file  . Food insecurity    Worry: Not on file     Inability: Not on file  . Transportation needs    Medical: Not on file    Non-medical: Not on file  Tobacco Use  . Smoking status: Former Smoker    Quit date: 04/18/1966    Years since quitting: 53.3  . Smokeless tobacco: Former Network engineer and Sexual Activity  . Alcohol use: No    Alcohol/week: 0.0 standard drinks  . Drug use: No  . Sexual activity: Not on file  Lifestyle  . Physical activity    Days per week: Not on  file    Minutes per session: Not on file  . Stress: Not on file  Relationships  . Social Herbalist on phone: Not on file    Gets together: Not on file    Attends religious service: Not on file    Active member of club or organization: Not on file    Attends meetings of clubs or organizations: Not on file    Relationship status: Not on file  . Intimate partner violence    Fear of current or ex partner: Not on file    Emotionally abused: Not on file    Physically abused: Not on file    Forced sexual activity: Not on file  Other Topics Concern  . Not on file  Social History Narrative  . Not on file  :  Pertinent items are noted in HPI.  Exam:  General appearance: alert and cooperative appeared without distress. Head: atraumatic without any abnormalities. Eyes: conjunctivae/corneas clear. PERRL.  Sclera anicteric. Throat: lips, mucosa, and tongue normal; without oral thrush or ulcers. Resp: clear to auscultation bilaterally without rhonchi, wheezes or dullness to percussion. Cardio: Irregular without any murmur or gallops.  No lower extremity edema noted. GI: soft, non-tender; bowel sounds normal; no masses,  no organomegaly Skin: Skin color, texture, turgor normal. No rashes or lesions Lymph nodes: Cervical, supraclavicular, and axillary nodes normal. Neurologic: Grossly normal without any motor, sensory or deep tendon reflexes. Musculoskeletal: No joint deformity or effusion.  CBC    Component Value Date/Time   WBC 9.0 11/13/2008 0447    RBC 3.53 (L) 11/13/2008 0447   HGB 13.3 10/09/2015 1044   HCT 39.0 10/09/2015 1044   PLT 286 11/13/2008 0447   MCV 93.3 11/13/2008 0447   MCHC 34.7 11/13/2008 0447   RDW 13.3 11/13/2008 0447     Chemistry      Component Value Date/Time   NA 138 10/09/2015 1044   K 4.1 10/09/2015 1044   CL 104 01/20/2015 0913   CO2 26 01/20/2015 0913   BUN 14 01/20/2015 0913   CREATININE 1.30 (H) 07/20/2015 1715      Component Value Date/Time   CALCIUM 9.8 01/20/2015 0913   ALKPHOS 67 10/30/2008 1431   AST 25 10/30/2008 1431   ALT 25 10/30/2008 1431   BILITOT 1.0 10/30/2008 1431       Assessment and Plan:   78 year old with:  1.  Renal cell carcinoma diagnosed in 2016.  He is status post radical nephrectomy for clear cell histology T3a disease in the left kidney.  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  CT scan obtained on July 10, 2019 showed continuous increase in the size of his aortocaval lymph node.  The natural course of this disease and treatment options were reviewed today.  His options would include oral targeted therapy, single agent immunotherapy, combination immunotherapy agents as well as combination with oral targeted therapy and immunotherapy.  The risks and benefits of all these agents as well as complication associated with combinations were also reviewed.  We also discussed the possibility of not needing treatment at all although he understands that the disease will progress and possibly cause symptoms in the future and potentially other complications such as bowel obstruction among others.  The goal of therapy would be palliative at this time although high response rate has been documented with the combination of immunotherapy and oral targeted therapy.  After discussion today, he has opted to  continue with a short period of observation and he will have a repeat CT scan in 3 months at [facility].  We will decide  on treatment at that time whether to be done locally or via the [place] or continues to follow at [facility].  All his questions were answered to his satisfaction.  2.  Follow-up: Will be determined depending on his willingness to proceed with treatment and whether he wants to do it locally.   60  minutes was spent with the patient face-to-face today.  More than 50% of time was spent on reviewing his disease status, reviewing imaging studies, treatment options and future plan of care.     Thank you for the referral.  I had the pleasure of meeting this patient today.  A copy of this consult has been forwarded to the requesting physician.

## 2019-09-16 NOTE — Unmapped (Signed)
Called [employer] to put the patient's Axitinib prescription on hold. [employer] representative confirmed that they will hold the medication and wait for [place] to contact them before dispensing the medication to the patient.     Debria Garret, PharmD Candidate    Laverna Peace PharmD, BCOP, CPP  Hematology/Oncology Pharmacist  P: [phone]

## 2019-10-09 ENCOUNTER — Encounter: Admit: 2019-10-09 | Discharge: 2019-10-09 | Payer: MEDICARE

## 2019-10-09 DIAGNOSIS — Z79899 Other long term (current) drug therapy: Principal | ICD-10-CM

## 2019-10-09 DIAGNOSIS — C642 Malignant neoplasm of left kidney, except renal pelvis: Principal | ICD-10-CM

## 2019-10-09 LAB — CBC W/ AUTO DIFF
BASOPHILS RELATIVE PERCENT: 0.8 %
EOSINOPHILS ABSOLUTE COUNT: 0.3 10*9/L (ref 0.0–0.4)
EOSINOPHILS RELATIVE PERCENT: 2.6 %
HEMATOCRIT: 43.4 % (ref 41.0–53.0)
LARGE UNSTAINED CELLS: 1 % (ref 0–4)
LYMPHOCYTES ABSOLUTE COUNT: 1.2 10*9/L — ABNORMAL LOW (ref 1.5–5.0)
LYMPHOCYTES RELATIVE PERCENT: 12.9 %
MEAN CORPUSCULAR HEMOGLOBIN CONC: 32.2 g/dL (ref 31.0–37.0)
MEAN CORPUSCULAR HEMOGLOBIN: 29.8 pg (ref 26.0–34.0)
MEAN CORPUSCULAR VOLUME: 92.6 fL (ref 80.0–100.0)
MEAN PLATELET VOLUME: 7.6 fL (ref 7.0–10.0)
MONOCYTES ABSOLUTE COUNT: 0.5 10*9/L (ref 0.2–0.8)
MONOCYTES RELATIVE PERCENT: 5.6 %
NEUTROPHILS ABSOLUTE COUNT: 7.4 10*9/L (ref 2.0–7.5)
NEUTROPHILS RELATIVE PERCENT: 76.7 %
PLATELET COUNT: 464 10*9/L — ABNORMAL HIGH (ref 150–440)
RED BLOOD CELL COUNT: 4.69 10*12/L (ref 4.50–5.90)
RED CELL DISTRIBUTION WIDTH: 14.6 % (ref 12.0–15.0)
WBC ADJUSTED: 9.6 10*9/L (ref 4.5–11.0)

## 2019-10-09 LAB — ANION GAP: Anion gap 3:SCnc:Pt:Ser/Plas:Qn:: 12

## 2019-10-09 LAB — COMPREHENSIVE METABOLIC PANEL
ALBUMIN: 4.1 g/dL (ref 3.5–5.0)
ALKALINE PHOSPHATASE: 74 U/L (ref 38–126)
ALT (SGPT): 15 U/L (ref <50)
ANION GAP: 12 mmol/L (ref 7–15)
AST (SGOT): 24 U/L (ref 19–55)
BILIRUBIN TOTAL: 0.9 mg/dL (ref 0.0–1.2)
BLOOD UREA NITROGEN: 14 mg/dL (ref 7–21)
BUN / CREAT RATIO: 14
CALCIUM: 10.4 mg/dL — ABNORMAL HIGH (ref 8.5–10.2)
CHLORIDE: 105 mmol/L (ref 98–107)
CO2: 23 mmol/L (ref 22.0–30.0)
CREATININE: 1 mg/dL (ref 0.70–1.30)
EGFR CKD-EPI AA MALE: 83 mL/min/{1.73_m2} (ref >=60)
EGFR CKD-EPI NON-AA MALE: 72 mL/min/{1.73_m2} (ref >=60)
GLUCOSE RANDOM: 114 mg/dL (ref 70–179)
POTASSIUM: 4.8 mmol/L (ref 3.5–5.0)
PROTEIN TOTAL: 7.1 g/dL (ref 6.5–8.3)

## 2019-10-09 LAB — THYROID STIMULATING HORMONE: Thyrotropin:ACnc:Pt:Ser/Plas:Qn:: 0.877

## 2019-10-09 LAB — PHOSPHORUS: Phosphate:MCnc:Pt:Ser/Plas:Qn:: 3.3

## 2019-10-09 LAB — LYMPHOCYTES RELATIVE PERCENT: Lymphocytes/100 leukocytes:NFr:Pt:Bld:Qn:Automated count: 12.9

## 2019-10-09 LAB — FREE T4: Thyroxine.free:MCnc:Pt:Ser/Plas:Qn:: 1.81 — ABNORMAL HIGH

## 2019-10-09 NOTE — Unmapped (Signed)
#  22 PIV placed to RFA, labs collected, sent for analysis. PIV flushed, saline locked.

## 2019-10-10 ENCOUNTER — Encounter
Admit: 2019-10-10 | Discharge: 2019-10-11 | Payer: MEDICARE | Attending: Hematology & Oncology | Primary: Hematology & Oncology

## 2019-10-10 DIAGNOSIS — C772 Secondary and unspecified malignant neoplasm of intra-abdominal lymph nodes: Principal | ICD-10-CM

## 2019-10-10 DIAGNOSIS — C642 Malignant neoplasm of left kidney, except renal pelvis: Principal | ICD-10-CM

## 2019-10-10 NOTE — Unmapped (Signed)
No treatment for now. Repeat scans.    Please call [phone] to reach my nurse navigator Mauricia Area for any issues.    For emergencies on Nights, Weekends and Holidays  Call [phone] and ask for the hematology/oncology on call.    Griffin Basil, MD, PhD  Associate Professor of Medicine  Division of Hematology-Oncology    [facility]  Genitourinary Oncology Clinic  Nurse Navigator: Mauricia Area  Fax: [phone]
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
## 2019-11-12 ENCOUNTER — Other Ambulatory Visit: Payer: Self-pay

## 2019-11-12 ENCOUNTER — Ambulatory Visit: Payer: Medicare Other | Admitting: Podiatry

## 2019-11-12 DIAGNOSIS — L600 Ingrowing nail: Secondary | ICD-10-CM

## 2019-11-12 DIAGNOSIS — M79675 Pain in left toe(s): Secondary | ICD-10-CM | POA: Diagnosis not present

## 2019-11-12 NOTE — Patient Instructions (Signed)

## 2019-11-13 ENCOUNTER — Telehealth: Payer: Self-pay | Admitting: Podiatry

## 2019-11-13 MED ORDER — CEPHALEXIN 500 MG PO CAPS: 500.0000 mg | ORAL_CAPSULE | Freq: Three times a day (TID) | ORAL | 0 refills | Status: DC

## 2019-11-13 NOTE — Telephone Encounter (Signed)
Pt was seen in office yesterday and was supposed to have an antibiotic sent in to his pharmacy but they have not received the prescription.   Pharmacy is [facility].

## 2019-11-20 NOTE — Progress Notes (Signed)
Subjective:   Patient ID: Evan Kirk, male   DOB: 78 y.o.   MRN: [id number]   HPI 78 year old male presents the office with concerns of ingrown toenail left big toe, medial aspect.  He states the area has been tender and causing discomfort pressure in shoes.  Denies any drainage or pus coming from the area.  No swelling or redness.  He tried trimming the nail with any resolution.   Review of Systems  All other systems reviewed and are negative.  Past Medical History:  Diagnosis Date  . Anticoagulated on Coumadin    followed by [place] [facility]  . Atrial fibrillation ([place])   . Back pain   . Bladder mass   . BPH (benign prostatic hypertrophy)   . Chronic atrial fibrillation   . Diastolic CHF, chronic [facility])    cardiologist-- dr Daneen Schick-- asymptomatic  . Diverticulosis of colon   . Edema   . Essential tremor 06-2015  . History of urinary retention   . HTN (hypertension)   . OSA on CPAP   . Peripheral neuropathy    BILATERAL FEET  . Prediabetes   . PTSD (post-traumatic stress disorder)   . Renal cell carcinoma of left kidney [facility])     Past Surgical History:  Procedure Laterality Date  . CYSTOSCOPY WITH BIOPSY N/A 10/09/2015   Procedure: CYSTOSCOPY WITH BIOPSY;  Surgeon: Cleon Gustin, MD;  [facility]: [facility];  Service: Urology;  Laterality: N/A;  . NEPHRECTOMY Left   . NEPHRECTOMY RADICAL Left 08/13/2015   [facility]-  . TOTAL KNEE ARTHROPLASTY Right 11-10-2008  . TRANSTHORACIC ECHOCARDIOGRAM  08-12-2015      moderate concentric LVH, ef 50-55-%/  trivial MR , PR and TR/  severe LAE and RAE  . VASECTOMY REVERSAL  1978     Current Outpatient Medications:  .  acetaminophen (TYLENOL) 500 MG tablet, Take 500 mg by mouth every 6 (six) hours as needed., Disp: , Rfl:  .  benzoyl peroxide 5 % gel, Apply topically daily., Disp: , Rfl:  .  carboxymethylcellulose (REFRESH PLUS) 0.5 % SOLN, 1 drop 3 (three) times daily as needed., Disp: , Rfl:  .   cholecalciferol (VITAMIN D) 1000 UNITS tablet, Take 2,000 Units by mouth daily., Disp: , Rfl:  .  docusate sodium (COLACE) 100 MG capsule, Take 100 mg by mouth 2 (two) times daily., Disp: , Rfl:  .  hydrocortisone 2.5 % lotion, Apply topically 2 (two) times daily., Disp: , Rfl:  .  ketoconazole (NIZORAL) 2 % shampoo, Apply 1 application topically 2 (two) times a week., Disp: , Rfl:  .  loratadine (CLARITIN) 10 MG tablet, Take 10 mg by mouth daily as needed for allergies. , Disp: , Rfl:  .  methylPREDNISolone (MEDROL DOSEPAK) 4 MG TBPK tablet, Take as directed, Disp: 21 tablet, Rfl: 0 .  metoprolol (LOPRESSOR) 50 MG tablet, Take 0.5 tablets (25 mg total) by mouth 2 (two) times daily., Disp: 30 tablet, Rfl: 6 .  metroNIDAZOLE (METROCREAM) 0.75 % cream, Apply 1 application topically daily as needed. (roscea), Disp: , Rfl:  .  Multiple Vitamin (MULTIVITAMIN) capsule, Take 1 capsule by mouth daily., Disp: , Rfl:  .  OVER THE COUNTER MEDICATION, Take 2 capsules by mouth at bedtime. Med Name: INTESTINAL FORMULA #1, Disp: , Rfl:  .  senna (SENOKOT) 8.6 MG tablet, Take 1 tablet by mouth at bedtime., Disp: , Rfl:  .  spironolactone (ALDACTONE) 25 MG tablet, Take 25 mg by mouth  daily., Disp: , Rfl:  .  tamsulosin (FLOMAX) 0.4 MG CAPS capsule, Take 0.4 mg by mouth daily., Disp: , Rfl:  .  traMADol (ULTRAM) 50 MG tablet, Take 1 tablet (50 mg total) by mouth every 6 (six) hours as needed., Disp: 30 tablet, Rfl: 0 .  traZODone (DESYREL) 100 MG tablet, Take 100 mg by mouth at bedtime as needed for sleep. , Disp: , Rfl:  .  cephALEXin (KEFLEX) 500 MG capsule, Take 1 capsule (500 mg total) by mouth 3 (three) times daily., Disp: 21 capsule, Rfl: 0  Allergies  Allergen Reactions  . Diltiazem Other (See Comments)    Pt didn't feel well          Objective:  Physical Exam  General: AAO x3, NAD  Dermatological: Incurvation present medial aspect of the left hallux toenail with tenderness palpation.  There is no  significant edema, erythema, drainage or pus or signs of infection.  No open lesions.  Vascular: Dorsalis Pedis artery and Posterior Tibial artery pedal pulses are 2/4 bilateral with immedate capillary fill time. P There is no pain with calf compression, swelling, warmth, erythema.   Neruologic: Grossly intact via light touch bilateral. Protective threshold with Semmes Wienstein monofilament intact to all pedal sites bilateral.   Musculoskeletal: No gross boney pedal deformities bilateral. No pain, crepitus, or limitation noted with foot and ankle range of motion bilateral. Muscular strength 5/5 in all groups tested bilateral.  Gait: Unassisted, Nonantalgic.       Assessment:   Ingrown toenail left medial hallux    Plan:  -Treatment options discussed including all alternatives, risks, and complications -Etiology of symptoms were discussed -At this time, the patient is requesting partial nail removal with chemical matricectomy to the symptomatic portion of the nail. Risks and complications were discussed with the patient for which they understand and written consent was obtained. Under sterile conditions a total of 3 mL of a mixture of 2% lidocaine plain and 0.5% Marcaine plain was infiltrated in a hallux block fashion. Once anesthetized, the skin was prepped in sterile fashion. A tourniquet was then applied. Next the [place] aspect of hallux nail border was then sharply excised making sure to remove the entire offending nail border. Once the nails were ensured to be removed area was debrided and the underlying skin was intact. There is no purulence identified in the procedure. Next phenol was then applied under standard conditions and copiously irrigated. Silvadene was applied. A dry sterile dressing was applied. After application of the dressing the tourniquet was removed and there is found to be an immediate capillary refill time to the digit. The patient tolerated the procedure well any  complications. Post procedure instructions were discussed the patient for which he verbally understood. Follow-up in one week for nail check or sooner if any problems are to arise. Discussed signs/symptoms of infection and directed to call the office immediately should any occur or go directly to the emergency room. In the meantime, encouraged to call the office with any questions, concerns, changes symptoms. -keflex  Return in about 2 weeks (around 11/26/2019) for nail check .  Trula Slade DPM

## 2019-11-26 ENCOUNTER — Ambulatory Visit: Payer: Medicare Other | Admitting: Podiatry

## 2020-01-08 ENCOUNTER — Encounter: Admit: 2020-01-08 | Discharge: 2020-01-08 | Payer: MEDICARE

## 2020-01-08 DIAGNOSIS — C642 Malignant neoplasm of left kidney, except renal pelvis: Principal | ICD-10-CM

## 2020-01-08 LAB — CBC W/ AUTO DIFF
BASOPHILS ABSOLUTE COUNT: 0.1 10*9/L (ref 0.0–0.1)
BASOPHILS RELATIVE PERCENT: 0.7 %
EOSINOPHILS ABSOLUTE COUNT: 0.3 10*9/L (ref 0.0–0.4)
EOSINOPHILS RELATIVE PERCENT: 2.8 %
HEMATOCRIT: 38.6 % — ABNORMAL LOW (ref 41.0–53.0)
HEMOGLOBIN: 12.8 g/dL — ABNORMAL LOW (ref 13.5–17.5)
LARGE UNSTAINED CELLS: 1 % (ref 0–4)
LYMPHOCYTES ABSOLUTE COUNT: 1.1 10*9/L — ABNORMAL LOW (ref 1.5–5.0)
MEAN CORPUSCULAR HEMOGLOBIN CONC: 33.1 g/dL (ref 31.0–37.0)
MEAN CORPUSCULAR VOLUME: 92.3 fL (ref 80.0–100.0)
MEAN PLATELET VOLUME: 7.8 fL (ref 7.0–10.0)
MONOCYTES ABSOLUTE COUNT: 0.7 10*9/L (ref 0.2–0.8)
MONOCYTES RELATIVE PERCENT: 6.9 %
NEUTROPHILS ABSOLUTE COUNT: 8.4 10*9/L — ABNORMAL HIGH (ref 2.0–7.5)
NEUTROPHILS RELATIVE PERCENT: 78 %
PLATELET COUNT: 340 10*9/L (ref 150–440)
RED BLOOD CELL COUNT: 4.18 10*12/L — ABNORMAL LOW (ref 4.50–5.90)
RED CELL DISTRIBUTION WIDTH: 15.8 % — ABNORMAL HIGH (ref 12.0–15.0)
WBC ADJUSTED: 10.8 10*9/L (ref 4.5–11.0)

## 2020-01-08 LAB — COMPREHENSIVE METABOLIC PANEL
ALBUMIN: 3.3 g/dL — ABNORMAL LOW (ref 3.5–5.0)
ALT (SGPT): 9 U/L (ref <50)
ANION GAP: 5 mmol/L — ABNORMAL LOW (ref 7–15)
AST (SGOT): 27 U/L (ref 19–55)
BILIRUBIN TOTAL: 1 mg/dL (ref 0.0–1.2)
BLOOD UREA NITROGEN: 13 mg/dL (ref 7–21)
BUN / CREAT RATIO: 13
CALCIUM: 9.3 mg/dL (ref 8.5–10.2)
CHLORIDE: 103 mmol/L (ref 98–107)
CO2: 26 mmol/L (ref 22.0–30.0)
EGFR CKD-EPI AA MALE: 80 mL/min/{1.73_m2} (ref >=60)
EGFR CKD-EPI NON-AA MALE: 69 mL/min/{1.73_m2} (ref >=60)
GLUCOSE RANDOM: 91 mg/dL (ref 70–179)
POTASSIUM: 4.5 mmol/L (ref 3.5–5.0)
PROTEIN TOTAL: 6.6 g/dL (ref 6.5–8.3)
SODIUM: 134 mmol/L — ABNORMAL LOW (ref 135–145)

## 2020-01-08 LAB — SODIUM: Sodium:SCnc:Pt:Ser/Plas:Qn:: 134 — ABNORMAL LOW

## 2020-01-08 LAB — BASOPHILS RELATIVE PERCENT: Basophils/100 leukocytes:NFr:Pt:Bld:Qn:Automated count: 0.7

## 2020-01-09 ENCOUNTER — Encounter
Admit: 2020-01-09 | Discharge: 2020-01-10 | Payer: MEDICARE | Attending: Hematology & Oncology | Primary: Hematology & Oncology

## 2020-01-09 DIAGNOSIS — C642 Malignant neoplasm of left kidney, except renal pelvis: Principal | ICD-10-CM

## 2020-01-09 DIAGNOSIS — C772 Secondary and unspecified malignant neoplasm of intra-abdominal lymph nodes: Principal | ICD-10-CM

## 2020-01-09 NOTE — Unmapped (Signed)
Repeat scans in 3 months.    Please call [phone] to reach my nurse navigator Mauricia Area for any issues.    For emergencies on Nights, Weekends and Holidays  Call [phone] and ask for the hematology/oncology on call.    Griffin Basil, MD, PhD  Associate Professor of Medicine  Division of Hematology-Oncology    [facility]  Genitourinary Oncology Clinic  Nurse Navigator: Mauricia Area  Fax: [phone]

## 2020-01-09 NOTE — Unmapped (Signed)
Labs collected from pre-existing PIV. Care by Ulyess Mort LPN.
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
## 2020-04-08 ENCOUNTER — Encounter: Admit: 2020-04-08 | Discharge: 2020-04-08 | Payer: MEDICARE

## 2020-04-08 DIAGNOSIS — C642 Malignant neoplasm of left kidney, except renal pelvis: Principal | ICD-10-CM

## 2020-04-08 LAB — COMPREHENSIVE METABOLIC PANEL
ALBUMIN: 4.2 g/dL (ref 3.5–5.0)
ALKALINE PHOSPHATASE: 67 U/L (ref 38–126)
ANION GAP: 8 mmol/L (ref 7–15)
AST (SGOT): 31 U/L (ref 19–55)
BILIRUBIN TOTAL: 1.3 mg/dL — ABNORMAL HIGH (ref 0.0–1.2)
BLOOD UREA NITROGEN: 15 mg/dL (ref 7–21)
BUN / CREAT RATIO: 14
CALCIUM: 10.3 mg/dL — ABNORMAL HIGH (ref 8.5–10.2)
CHLORIDE: 103 mmol/L (ref 98–107)
CO2: 22 mmol/L (ref 22.0–30.0)
CREATININE: 1.06 mg/dL (ref 0.70–1.30)
EGFR CKD-EPI NON-AA MALE: 67 mL/min/{1.73_m2} (ref >=60)
GLUCOSE RANDOM: 106 mg/dL (ref 70–179)
POTASSIUM: 5.1 mmol/L — ABNORMAL HIGH (ref 3.5–5.0)
PROTEIN TOTAL: 7.8 g/dL (ref 6.5–8.3)
SODIUM: 133 mmol/L — ABNORMAL LOW (ref 135–145)

## 2020-04-08 LAB — CBC W/ AUTO DIFF
BASOPHILS ABSOLUTE COUNT: 0.1 10*9/L (ref 0.0–0.1)
BASOPHILS RELATIVE PERCENT: 0.8 %
EOSINOPHILS RELATIVE PERCENT: 2.6 %
HEMATOCRIT: 43.2 % (ref 41.0–53.0)
HEMOGLOBIN: 14.1 g/dL (ref 13.5–17.5)
LARGE UNSTAINED CELLS: 1 % (ref 0–4)
LYMPHOCYTES ABSOLUTE COUNT: 1.5 10*9/L (ref 1.5–5.0)
MEAN CORPUSCULAR HEMOGLOBIN CONC: 32.6 g/dL (ref 31.0–37.0)
MEAN CORPUSCULAR VOLUME: 93 fL (ref 80.0–100.0)
MEAN PLATELET VOLUME: 7.1 fL (ref 7.0–10.0)
MONOCYTES ABSOLUTE COUNT: 0.7 10*9/L (ref 0.2–0.8)
NEUTROPHILS ABSOLUTE COUNT: 7.2 10*9/L (ref 2.0–7.5)
NEUTROPHILS RELATIVE PERCENT: 73.2 %
PLATELET COUNT: 431 10*9/L (ref 150–440)
RED BLOOD CELL COUNT: 4.64 10*12/L (ref 4.50–5.90)
RED CELL DISTRIBUTION WIDTH: 14 % (ref 12.0–15.0)
WBC ADJUSTED: 9.9 10*9/L (ref 4.5–11.0)

## 2020-04-08 LAB — CO2: Carbon dioxide:SCnc:Pt:Ser/Plas:Qn:: 22

## 2020-04-08 LAB — BILIRUBIN DIRECT: Bilirubin.glucuronidated+Bilirubin.albumin bound:MCnc:Pt:Ser/Plas:Qn:: 0.2

## 2020-04-08 LAB — WBC ADJUSTED: Leukocytes:NCnc:Pt:Bld:Qn:: 9.9

## 2020-04-08 NOTE — Unmapped (Signed)
PIV placed (#20 RFA).  Labs drawn & sent for analysis. To next appt.  Care provided by  Towanda Malkin RN.

## 2020-04-09 ENCOUNTER — Ambulatory Visit
Admit: 2020-04-09 | Discharge: 2020-04-10 | Payer: MEDICARE | Attending: Hematology & Oncology | Primary: Hematology & Oncology

## 2020-04-09 DIAGNOSIS — C642 Malignant neoplasm of left kidney, except renal pelvis: Principal | ICD-10-CM

## 2020-04-09 DIAGNOSIS — C772 Secondary and unspecified malignant neoplasm of intra-abdominal lymph nodes: Principal | ICD-10-CM
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
## 2020-04-09 NOTE — Unmapped (Signed)
We decided to continue with monitoring with CT scans in 3 months.    Please call [phone] to reach my nurse navigator Mauricia Area for any issues.    For emergencies on Nights, Weekends and Holidays  Call [phone] and ask for the hematology/oncology on call.    Griffin Basil, MD, PhD  Associate Professor of Medicine  Division of Hematology-Oncology    [facility]  Genitourinary Oncology Clinic  Nurse Navigator: Mauricia Area  Fax: [phone]

## 2020-05-11 ENCOUNTER — Other Ambulatory Visit: Payer: Self-pay

## 2020-05-11 ENCOUNTER — Encounter: Payer: Self-pay | Admitting: Interventional Cardiology

## 2020-05-11 ENCOUNTER — Ambulatory Visit: Payer: Medicare Other | Admitting: Interventional Cardiology

## 2020-05-11 VITALS — BP 118/60 | HR 63 | Ht 67.0 in | Wt 240.4 lb

## 2020-05-11 DIAGNOSIS — I5032 Chronic diastolic (congestive) heart failure: Secondary | ICD-10-CM | POA: Diagnosis not present

## 2020-05-11 DIAGNOSIS — I10 Essential (primary) hypertension: Secondary | ICD-10-CM | POA: Diagnosis not present

## 2020-05-11 DIAGNOSIS — I482 Chronic atrial fibrillation, unspecified: Secondary | ICD-10-CM

## 2020-05-11 DIAGNOSIS — G4733 Obstructive sleep apnea (adult) (pediatric): Secondary | ICD-10-CM | POA: Diagnosis not present

## 2020-05-11 DIAGNOSIS — E7800 Pure hypercholesterolemia, unspecified: Secondary | ICD-10-CM | POA: Diagnosis not present

## 2020-05-11 NOTE — Patient Instructions (Signed)

## 2020-05-11 NOTE — Progress Notes (Signed)
Cardiology Office Note:    Date:  05/11/2020   ID:  Evan Kirk, DOB 12-15-1941, MRN [id number]  PCP:  Lawerance Cruel, MD  Cardiologist:  Sinclair Grooms, MD   Referring MD: Lawerance Cruel, MD   Chief Complaint  Patient presents with  . Atrial Fibrillation    History of Present Illness:    Evan Kirk is a 79 y.o. 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  [name] has no cardiac complaints.  He has had no blood in his urine or stool.  Renal cell carcinoma was identified because of hematuria initially.  He is now on apixaban twice per day.  He has had no bleeding complications.  Cardiovascular questioning identifies no symptoms of angina, syncope, palpitations, edema, or other problems.  Past Medical History:  Diagnosis Date  . Anticoagulated on Coumadin    followed by [place] [facility]  . Atrial fibrillation ([place])   . Back pain   . Bladder mass   . BPH (benign prostatic hypertrophy)   . Chronic atrial fibrillation ([place])   . Diastolic CHF, chronic [facility])    cardiologist-- dr Daneen Schick-- asymptomatic  . Diverticulosis of colon   . Edema   . Essential tremor 06-2015  . History of urinary retention   . HTN (hypertension)   . OSA on CPAP   . Peripheral neuropathy    BILATERAL FEET  . Prediabetes   . PTSD (post-traumatic stress disorder)   . Renal cell carcinoma of left kidney [facility])     Past Surgical History:  Procedure Laterality Date  . CYSTOSCOPY WITH BIOPSY N/A 10/09/2015   Procedure: CYSTOSCOPY WITH BIOPSY;  Surgeon: Cleon Gustin, MD;  Location: [facility];  Service: Urology;  Laterality: N/A;  . NEPHRECTOMY Left   . NEPHRECTOMY RADICAL Left 08/13/2015   [facility]-  . TOTAL KNEE ARTHROPLASTY Right 11-10-2008  . TRANSTHORACIC  ECHOCARDIOGRAM  08-12-2015      moderate concentric LVH, ef 50-55-%/  trivial MR , PR and TR/  severe LAE and RAE  . VASECTOMY REVERSAL  1978    Current Medications: Current Meds  Medication Sig  . acetaminophen (TYLENOL) 500 MG tablet Take 500 mg by mouth every 6 (six) hours as needed.  Marland Kitchen apixaban (ELIQUIS) 5 MG TABS tablet Take 5 mg by mouth 2 (two) times daily.  . benzoyl peroxide 5 % gel Apply topically daily.  . carboxymethylcellulose (REFRESH PLUS) 0.5 % SOLN 1 drop 3 (three) times daily as needed.  . cephALEXin (KEFLEX) 500 MG capsule Take 1 capsule (500 mg total) by mouth 3 (three) times daily.  . cholecalciferol (VITAMIN D) 1000 UNITS tablet Take 2,000 Units by mouth daily.  Marland Kitchen docusate sodium (COLACE) 100 MG capsule Take 100 mg by mouth 2 (two) times daily.  . hydrocortisone 2.5 % lotion Apply topically 2 (two) times daily.  Marland Kitchen ketoconazole (NIZORAL) 2 % shampoo Apply 1 application topically 2 (two) times a week.  . loratadine (CLARITIN) 10 MG tablet Take 10 mg by mouth daily as needed for allergies.   . methylPREDNISolone (MEDROL DOSEPAK) 4 MG TBPK tablet Take as directed  . metoprolol (LOPRESSOR) 50 MG tablet Take 0.5 tablets (25 mg total) by mouth 2 (two) times daily.  . metroNIDAZOLE (METROCREAM) 0.75 % cream Apply 1 application topically daily as needed. (roscea)  .  Multiple Vitamin (MULTIVITAMIN) capsule Take 1 capsule by mouth daily.  Marland Kitchen OVER THE COUNTER MEDICATION Take 2 capsules by mouth at bedtime. Med Name: INTESTINAL FORMULA #1  . senna (SENOKOT) 8.6 MG tablet Take 1 tablet by mouth at bedtime.  Marland Kitchen spironolactone (ALDACTONE) 25 MG tablet Take 25 mg by mouth daily.  . tamsulosin (FLOMAX) 0.4 MG CAPS capsule Take 0.4 mg by mouth daily.  . traMADol (ULTRAM) 50 MG tablet Take 1 tablet (50 mg total) by mouth every 6 (six) hours as needed.  . traZODone (DESYREL) 100 MG tablet Take 100 mg by mouth at bedtime as needed for sleep.      Allergies:   Diltiazem   Social History     Socioeconomic History  . Marital status: Married    Spouse name: Not on file  . Number of children: Not on file  . Years of education: Not on file  . Highest education level: Not on file  Occupational History  . Occupation: retired    Comment: Therapist, art x 5 years; Water engineer  Tobacco Use  . Smoking status: Former Smoker    Quit date: 04/18/1966    Years since quitting: 54.1  . Smokeless tobacco: Former Network engineer and Sexual Activity  . Alcohol use: No    Alcohol/week: 0.0 standard drinks  . Drug use: No  . Sexual activity: Not on file  Other Topics Concern  . Not on file  Social History Narrative  . Not on file   Social Determinants of Health   Financial Resource Strain:   . Difficulty of Paying Living Expenses:   Food Insecurity:   . Worried About Charity fundraiser in the Last Year:   . Arboriculturist in the Last Year:   Transportation Needs:   . Film/video editor (Medical):   Marland Kitchen Lack of Transportation (Non-Medical):   Physical Activity:   . Days of Exercise per Week:   . Minutes of Exercise per Session:   Stress:   . Feeling of Stress :   Social Connections:   . Frequency of Communication with Friends and Family:   . Frequency of Social Gatherings with Friends and Family:   . Attends Religious Services:   . Active Member of Clubs or Organizations:   . Attends Archivist Meetings:   Marland Kitchen Marital Status:      Family History: The patient's family history includes Stroke in his mother.  ROS:   Please see the history of present illness.    No new data all other systems reviewed and are negative.  EKGs/Labs/Other Studies Reviewed:    The following studies were reviewed today: None  EKG:  EKG atrial fib with controlled rate.  Left axis deviation.  Left axis deviation.  Recent Labs: No results found for requested labs within last 8760 hours.  Recent Lipid Panel No results found for: CHOL, TRIG, HDL, CHOLHDL, VLDL, LDLCALC,  LDLDIRECT  Physical Exam:    VS:  BP 118/60   Pulse 63   Ht 5\' 7"  (1.702 m)   Wt 240 lb 6.4 oz (109 kg)   SpO2 95%   BMI 37.65 kg/m     Wt Readings from Last 3 Encounters:  05/11/20 240 lb 6.4 oz (109 kg)  08/14/19 250 lb (113.4 kg)  11/13/18 271 lb 9.6 oz (123.2 kg)     GEN: Morbid obesity. No acute distress HEENT: Normal NECK: No JVD. LYMPHATICS: No lymphadenopathy CARDIAC: Irregularly irregular RR without murmur, gallop,  or edema. VASCULAR:  Normal Pulses. No bruits. RESPIRATORY:  Clear to auscultation without rales, wheezing or rhonchi  ABDOMEN: Soft, non-tender, non-distended, No pulsatile mass, MUSCULOSKELETAL: No deformity  SKIN: Warm and dry NEUROLOGIC:  Alert and oriented x 3 PSYCHIATRIC:  Normal affect   ASSESSMENT:    1. Chronic diastolic heart failure ([place])   2. Essential hypertension   3. OSA (obstructive sleep apnea)   4. Hypercholesteremia   5. Chronic atrial fibrillation (H[facility])    PLAN:    In order of problems listed above:  1. No evidence of volume overload. 2. Excellent blood pressure control. 3. CPAP is recommended. 4. Continue therapy for hyperlipidemia. 5. Rate is well controlled on current medical regimen. 6. Has been fully vaccinated for COVID-19.  Clinical follow-up in 1 year.   Medication Adjustments/Labs and Tests Ordered: Current medicines are reviewed at length with the patient today.  Concerns regarding medicines are outlined above.  Orders Placed This Encounter  Procedures  . EKG 12-Lead   No orders of the defined types were placed in this encounter.   Patient Instructions  Medication Instructions:  Your physician recommends that you continue on your current medications as directed. Please refer to the Current Medication list given to you today.  *If you need a refill on your cardiac medications before your next appointment, please call your pharmacy*   Lab Work: None If you have labs (blood work) drawn today and your  tests are completely normal, you will receive your results only by: Marland Kitchen MyChart Message (if you have MyChart) OR . A paper copy in the mail If you have any lab test that is abnormal or we need to change your treatment, we will call you to review the results.   Testing/Procedures: None   Follow-Up: At [facility], you and your health needs are our priority.  As part of our continuing mission to provide you with exceptional heart care, we have created designated Provider Care Teams.  These Care Teams include your primary Cardiologist (physician) and Advanced Practice Providers (APPs -  Physician Assistants and Nurse Practitioners) who all work together to provide you with the care you need, when you need it.  We recommend signing up for the patient portal called "MyChart".  Sign up information is provided on this After Visit Summary.  MyChart is used to connect with patients for Virtual Visits (Telemedicine).  Patients are able to view lab/test results, encounter notes, upcoming appointments, etc.  Non-urgent messages can be sent to your provider as well.   To learn more about what you can do with MyChart, go to [email or web address].    Your next appointment:   12 month(s)  The format for your next appointment:   In Person  Provider:   You may see Sinclair Grooms, MD or one of the following Advanced Practice Providers on your designated Care Team:    Truitt Merle, NP  Cecilie Kicks, NP  Kathyrn Drown, NP    Other Instructions      Signed, Sinclair Grooms, MD  05/11/2020 4:28 PM    [place]

## 2020-06-05 ENCOUNTER — Telehealth: Payer: Self-pay | Admitting: Interventional Cardiology

## 2020-06-05 NOTE — Telephone Encounter (Signed)
   [facility] Pre-operative Risk Assessment    Request for surgical clearance:  1. What type of surgery is being performed?  Incision and Drainage of Perianal Abscess  2. When is this surgery scheduled?  06/08/20  3. What type of clearance is required (medical clearance vs. Pharmacy clearance to hold med vs. Both)? Both  4. Are there any medications that need to be held prior to surgery and how long?  Eliquis 2-3 days  5. Practice name and name of physician performing surgery? [place], [place]  6. What is your office phone number? [phone]    7.   What is your office fax number? [phone]  8.   Anesthesia type (None, local, MAC, general) ? Local   Evan Kirk 06/05/2020, 3:39 PM  _________________________________________________________________   (provider comments below)

## 2020-06-05 NOTE — Telephone Encounter (Signed)
Patient called to inquiring about this clearance.

## 2020-06-05 NOTE — Telephone Encounter (Signed)
Please comment on eliquis. 

## 2020-06-08 NOTE — Telephone Encounter (Signed)
I s/w Abigail Butts at [facility] in regards to clearance notes. Abigail Butts stated they saw the clearance notes in Epic (read only). Abigail Butts states they have rescheduled the pt's procedure and will follow the recommendations that were given in regards to Eliquis. I thanked Abigail Butts for her help. I will remove from the pre op call back pool.

## 2020-06-08 NOTE — Telephone Encounter (Signed)
Patient with diagnosis of afib on Eliquis for anticoagulation.    Procedure: incision and drainage of perianal abscess  Date of procedure: 06/08/20  CHADS2-VASc score of 4 (age x2, CHF, HTN)  CrCl 38mL/min using adjusted body weight Platelet count 417K  Per office protocol, patient can hold Eliquis for 2-3 days prior to procedure.    Of note, clearance request was received/entered at 4pm on Friday and forwarded to PharmD pool at 5:30pm, no pharmacist is in the office after 5pm so clearance was not seen until this morning. Procedure was supposed to be today, if pt has not started holding his Eliquis, his procedure will need to be moved to a different day.

## 2020-06-24 ENCOUNTER — Other Ambulatory Visit: Payer: Self-pay

## 2020-06-24 ENCOUNTER — Ambulatory Visit: Payer: Medicare Other | Admitting: Podiatry

## 2020-06-24 DIAGNOSIS — T148XXA Other injury of unspecified body region, initial encounter: Secondary | ICD-10-CM

## 2020-06-24 DIAGNOSIS — M792 Neuralgia and neuritis, unspecified: Secondary | ICD-10-CM

## 2020-06-25 ENCOUNTER — Encounter: Payer: Self-pay | Admitting: Podiatry

## 2020-06-25 NOTE — Progress Notes (Signed)
Subjective:  Patient ID: Evan Kirk, male    DOB: 1941-06-25,  MRN: [id number]  Chief Complaint  Patient presents with  . Foot Pain    pt is here for a f/u of foot pain    79 y.o. male presents with the above complaint.  Patient presents with complaint of right dorsal foot pain that has mild ecchymosis present to it.  Patient states that he noticed it about 2 to 3 weeks ago.  He states that he got little bit worse then got better.  Today his pain has gone down however he just wanted to make sure that there was not anything acute going on.  Patient known to Dr. Earleen Newport who is been treating him for ingrown toenail which she seems to be doing really well from.  He just wants to make sure that there is nothing else going on.  Is painful to touch.  He denies any trauma to the area however he does not recall that well.  He denies any other acute complaints.  His pain has considerably improved.  He does have neuropathy secondary to diabetes.  He would like to discuss various treatment options for neuropathy as well.   Review of Systems: Negative except as noted in the HPI. Denies N/V/F/Ch.  Past Medical History:  Diagnosis Date  . Anticoagulated on Coumadin    followed by [facility]  . Atrial fibrillation ([place])   . Back pain   . Bladder mass   . BPH (benign prostatic hypertrophy)   . Chronic atrial fibrillation ([place])   . Diastolic CHF, chronic [facility])    cardiologist-- dr Daneen Schick-- asymptomatic  . Diverticulosis of colon   . Edema   . Essential tremor 06-2015  . History of urinary retention   . HTN (hypertension)   . OSA on CPAP   . Peripheral neuropathy    BILATERAL FEET  . Prediabetes   . PTSD (post-traumatic stress disorder)   . Renal cell carcinoma of left kidney ([facility])     Current Outpatient Medications:  .  acetaminophen (TYLENOL) 500 MG tablet, Take 500 mg by mouth every 6 (six) hours as needed., Disp: , Rfl:  .  apixaban (ELIQUIS) 5 MG TABS tablet, Take 5 mg by  mouth 2 (two) times daily., Disp: , Rfl:  .  benzoyl peroxide 5 % gel, Apply topically daily., Disp: , Rfl:  .  carboxymethylcellulose (REFRESH PLUS) 0.5 % SOLN, 1 drop 3 (three) times daily as needed., Disp: , Rfl:  .  cephALEXin (KEFLEX) 500 MG capsule, Take 1 capsule (500 mg total) by mouth 3 (three) times daily., Disp: 21 capsule, Rfl: 0 .  cholecalciferol (VITAMIN D) 1000 UNITS tablet, Take 2,000 Units by mouth daily., Disp: , Rfl:  .  ciprofloxacin (CIPRO) 500 MG tablet, Take 500 mg by mouth 2 (two) times daily., Disp: , Rfl:  .  docusate sodium (COLACE) 100 MG capsule, Take 100 mg by mouth 2 (two) times daily., Disp: , Rfl:  .  hydrocortisone 2.5 % lotion, Apply topically 2 (two) times daily., Disp: , Rfl:  .  ketoconazole (NIZORAL) 2 % shampoo, Apply 1 application topically 2 (two) times a week., Disp: , Rfl:  .  loratadine (CLARITIN) 10 MG tablet, Take 10 mg by mouth daily as needed for allergies. , Disp: , Rfl:  .  methylPREDNISolone (MEDROL DOSEPAK) 4 MG TBPK tablet, Take as directed, Disp: 21 tablet, Rfl: 0 .  metoprolol (LOPRESSOR) 50 MG tablet, Take 0.5 tablets (25  mg total) by mouth 2 (two) times daily., Disp: 30 tablet, Rfl: 6 .  metroNIDAZOLE (FLAGYL) 500 MG tablet, Take 500 mg by mouth 3 (three) times daily., Disp: , Rfl:  .  metroNIDAZOLE (METROCREAM) 0.75 % cream, Apply 1 application topically daily as needed. (roscea), Disp: , Rfl:  .  Multiple Vitamin (MULTIVITAMIN) capsule, Take 1 capsule by mouth daily., Disp: , Rfl:  .  NON FORMULARY, Peripheral Neuropathy Cream: Bupivacaine 1% Doxepin 3% Gabapentin 6% Pentoxifyline 3% Topiramate 1% Sig: Apply 1 to 2 gm to affected areas 3-4x daily prn  Refill 1, Disp: , Rfl:  .  OVER THE COUNTER MEDICATION, Take 2 capsules by mouth at bedtime. Med Name: INTESTINAL FORMULA #1, Disp: , Rfl:  .  senna (SENOKOT) 8.6 MG tablet, Take 1 tablet by mouth at bedtime., Disp: , Rfl:  .  spironolactone (ALDACTONE) 25 MG tablet, Take 25 mg by mouth  daily., Disp: , Rfl:  .  tamsulosin (FLOMAX) 0.4 MG CAPS capsule, Take 0.4 mg by mouth daily., Disp: , Rfl:  .  traMADol (ULTRAM) 50 MG tablet, Take 1 tablet (50 mg total) by mouth every 6 (six) hours as needed., Disp: 30 tablet, Rfl: 0 .  traZODone (DESYREL) 100 MG tablet, Take 100 mg by mouth at bedtime as needed for sleep. , Disp: , Rfl:   Social History   Tobacco Use  Smoking Status Former Smoker  . Quit date: 04/18/1966  . Years since quitting: 54.2  Smokeless Tobacco Former User    Allergies  Allergen Reactions  . Diltiazem Other (See Comments)    Pt didn't feel well    Objective:  There were no vitals filed for this visit. There is no height or weight on file to calculate BMI. Constitutional Well developed. Well nourished.  Vascular Dorsalis pedis pulses palpable bilaterally. Posterior tibial pulses palpable bilaterally. Capillary refill normal to all digits.  No cyanosis or clubbing noted. Pedal hair growth normal.  Neurologic Normal speech. Oriented to person, place, and time. Epicritic sensation to light touch grossly present bilaterally.  Dermatologic Nails well groomed and normal in appearance. No open wounds. No skin lesions.  Orthopedic:  Mild pain on palpation to the dorsal aspect of the midshaft of the fourth metatarsal.  No pain with range of motion of the fourth MTPJ joint.  No signs of Mulder's click/neuromas in the interspaces third and fourth.  No pain with dorsiflexion and plantarflexion with resistance of the toes for extensor and flexor tendinitis.   Radiographs: None Assessment:   1. Neuropathic pain   2. Contusion of soft tissue    Plan:  Patient was evaluated and treated and all questions answered.  Right dorsal forefoot mild contusion -I explained to patient the etiology of contusion likely small trauma to the area that may have led to it.  Patient is on warfarin therapy that could have led to bruising even with in setting of a very mild  trauma.  At this time I am not clinically concerned to obtain x-ray as his pain is considerably improving.  Clinically I was not able to palpate any localized source of pain.  I discussed with the patient if the pain continues gets worse and has not improved with then come back and see me and we can obtain x-rays to rule out any bony contusion  Neuropathic pain -I explained patient the etiology of neuropathic pain and various treatment options were extensively discussed.  Patient does not want any kind of gabapentin or any over-the-counter  medication that he has to take by mouth.  I discussed with him that he could benefit from neuropathic cream. -Prescription for neuropathic cream was sent to [facility].  No follow-ups on file.

## 2020-07-13 ENCOUNTER — Encounter: Admit: 2020-07-13 | Discharge: 2020-07-13 | Payer: MEDICARE

## 2020-07-13 ENCOUNTER — Ambulatory Visit: Admit: 2020-07-13 | Discharge: 2020-07-13 | Payer: MEDICARE

## 2020-07-13 DIAGNOSIS — C642 Malignant neoplasm of left kidney, except renal pelvis: Principal | ICD-10-CM

## 2020-07-13 LAB — RED CELL DISTRIBUTION WIDTH: Lab: 14.7

## 2020-07-13 LAB — COMPREHENSIVE METABOLIC PANEL
ALBUMIN: 3.5 g/dL (ref 3.4–5.0)
ALKALINE PHOSPHATASE: 71 U/L (ref 46–116)
ALT (SGPT): 8 U/L — ABNORMAL LOW (ref 10–49)
ANION GAP: 8 mmol/L (ref 5–14)
AST (SGOT): 18 U/L (ref <=34)
BILIRUBIN TOTAL: 1 mg/dL (ref 0.3–1.2)
BLOOD UREA NITROGEN: 15 mg/dL (ref 9–23)
BUN / CREAT RATIO: 15
CALCIUM: 10.2 mg/dL (ref 8.7–10.4)
CHLORIDE: 104 mmol/L (ref 98–107)
CO2: 21 mmol/L (ref 20.0–31.0)
CREATININE: 1.03 mg/dL
EGFR CKD-EPI AA MALE: 80 mL/min/{1.73_m2} (ref >=60)
GLUCOSE RANDOM: 108 mg/dL (ref 70–179)
POTASSIUM: 4.1 mmol/L (ref 3.4–4.5)
SODIUM: 133 mmol/L — ABNORMAL LOW (ref 135–145)

## 2020-07-13 LAB — CBC W/ AUTO DIFF
BASOPHILS ABSOLUTE COUNT: 0.1 10*9/L (ref 0.0–0.1)
BASOPHILS RELATIVE PERCENT: 0.9 %
EOSINOPHILS ABSOLUTE COUNT: 0.3 10*9/L (ref 0.0–0.4)
HEMATOCRIT: 42 % (ref 41.0–53.0)
HEMOGLOBIN: 13.9 g/dL (ref 13.5–17.5)
LARGE UNSTAINED CELLS: 1 % (ref 0–4)
LYMPHOCYTES ABSOLUTE COUNT: 1.3 10*9/L — ABNORMAL LOW (ref 1.5–5.0)
LYMPHOCYTES RELATIVE PERCENT: 14.3 %
MEAN CORPUSCULAR HEMOGLOBIN CONC: 33 g/dL (ref 31.0–37.0)
MEAN CORPUSCULAR HEMOGLOBIN: 31.2 pg (ref 26.0–34.0)
MEAN CORPUSCULAR VOLUME: 94.4 fL (ref 80.0–100.0)
MEAN PLATELET VOLUME: 7.5 fL (ref 7.0–10.0)
MONOCYTES ABSOLUTE COUNT: 0.6 10*9/L (ref 0.2–0.8)
MONOCYTES RELATIVE PERCENT: 7 %
NEUTROPHILS ABSOLUTE COUNT: 6.5 10*9/L (ref 2.0–7.5)
NEUTROPHILS RELATIVE PERCENT: 73 %
RED BLOOD CELL COUNT: 4.45 10*12/L — ABNORMAL LOW (ref 4.50–5.90)
RED CELL DISTRIBUTION WIDTH: 14.7 % (ref 12.0–15.0)
WBC ADJUSTED: 9 10*9/L (ref 4.5–11.0)

## 2020-07-13 LAB — GLUCOSE RANDOM: Glucose:MCnc:Pt:Ser/Plas:Qn:: 108

## 2020-07-13 MED ADMIN — iohexoL (OMNIPAQUE) 350 mg iodine/mL solution 100 mL: 100 mL | INTRAVENOUS | @ 18:00:00 | Stop: 2020-07-13

## 2020-07-13 NOTE — Unmapped (Signed)
13:04---PIV placed.  Labs drawn & sent for analysis. To next appt.  Care provided by Moishe Spice RN.

## 2020-07-14 ENCOUNTER — Encounter
Admit: 2020-07-14 | Discharge: 2020-07-15 | Payer: MEDICARE | Attending: Hematology & Oncology | Primary: Hematology & Oncology

## 2020-07-14 DIAGNOSIS — C772 Secondary and unspecified malignant neoplasm of intra-abdominal lymph nodes: Principal | ICD-10-CM

## 2020-07-14 DIAGNOSIS — C642 Malignant neoplasm of left kidney, except renal pelvis: Principal | ICD-10-CM

## 2020-07-14 NOTE — Unmapped (Signed)
Patient Vincent Turner was called in attempt number one to reach them regarding their upcoming appointments on 10/21/20.     The call resulted in: Left message    Please inform the patient upon their return call of the following:     10/21/20   1. 12:45pm labs  2. 1:30pm for CT scan    All appts are in HBO.    Thank you,    Estevan Oaks

## 2020-07-14 NOTE — Unmapped (Signed)
We discussed the fact that while the scans show minimal growth of tumor, we should also start thinking about treatment in the near future.    Please call [phone] to reach my nurse navigator Joaquin Bend or Lucia Gaskins for any issues.    For emergencies on Nights, Weekends and Holidays  Call [phone] and ask for the hematology/oncology on call.    Griffin Basil, MD, PhD  Associate Professor of Medicine  Division of Hematology-Oncology    [facility]  Genitourinary Oncology Clinic  Nurse Navigator: Nigel Berthold  Fax: [phone]
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
## 2020-10-01 MED ORDER — SERTRALINE 50 MG TABLET
Freq: Every day | ORAL | 0 days
Start: 2020-10-01 — End: ?

## 2020-10-20 DIAGNOSIS — C642 Malignant neoplasm of left kidney, except renal pelvis: Principal | ICD-10-CM

## 2020-10-20 DIAGNOSIS — C772 Secondary and unspecified malignant neoplasm of intra-abdominal lymph nodes: Principal | ICD-10-CM

## 2020-10-21 ENCOUNTER — Ambulatory Visit: Admit: 2020-10-21 | Discharge: 2020-10-21 | Payer: MEDICARE

## 2020-10-21 ENCOUNTER — Encounter: Admit: 2020-10-21 | Discharge: 2020-10-21 | Payer: MEDICARE

## 2020-10-21 DIAGNOSIS — I517 Cardiomegaly: Principal | ICD-10-CM

## 2020-10-21 DIAGNOSIS — C642 Malignant neoplasm of left kidney, except renal pelvis: Principal | ICD-10-CM

## 2020-10-21 DIAGNOSIS — C772 Secondary and unspecified malignant neoplasm of intra-abdominal lymph nodes: Principal | ICD-10-CM

## 2020-10-21 DIAGNOSIS — R918 Other nonspecific abnormal finding of lung field: Principal | ICD-10-CM

## 2020-10-21 LAB — CBC W/ AUTO DIFF
BASOPHILS ABSOLUTE COUNT: 0 10*9/L (ref 0.0–0.1)
BASOPHILS RELATIVE PERCENT: 0.2 %
EOSINOPHILS ABSOLUTE COUNT: 0.4 10*9/L (ref 0.0–0.7)
EOSINOPHILS RELATIVE PERCENT: 4.2 %
HEMATOCRIT: 39.7 % (ref 38.0–50.0)
LYMPHOCYTES ABSOLUTE COUNT: 1.6 10*9/L (ref 0.7–4.0)
LYMPHOCYTES RELATIVE PERCENT: 15.3 %
MEAN CORPUSCULAR HEMOGLOBIN CONC: 34.7 g/dL (ref 30.0–36.0)
MEAN CORPUSCULAR HEMOGLOBIN: 30.3 pg (ref 26.0–34.0)
MEAN CORPUSCULAR VOLUME: 87.4 fL (ref 81.0–95.0)
MEAN PLATELET VOLUME: 6.6 fL — ABNORMAL LOW (ref 7.0–10.0)
MONOCYTES ABSOLUTE COUNT: 1 10*9/L (ref 0.1–1.0)
MONOCYTES RELATIVE PERCENT: 9.7 %
NEUTROPHILS RELATIVE PERCENT: 70.6 %
PLATELET COUNT: 372 10*9/L (ref 150–450)
RED CELL DISTRIBUTION WIDTH: 13.7 % (ref 12.0–15.0)
WBC ADJUSTED: 10.5 10*9/L (ref 3.5–10.5)

## 2020-10-21 LAB — COMPREHENSIVE METABOLIC PANEL
ALBUMIN: 3.7 g/dL (ref 3.4–5.0)
ALKALINE PHOSPHATASE: 79 U/L (ref 46–116)
ALT (SGPT): 13 U/L (ref 10–49)
ANION GAP: 7 mmol/L (ref 5–14)
AST (SGOT): 17 U/L (ref <=34)
BILIRUBIN TOTAL: 0.8 mg/dL (ref 0.3–1.2)
BUN / CREAT RATIO: 11
CALCIUM: 10.1 mg/dL (ref 8.7–10.4)
CHLORIDE: 107 mmol/L (ref 98–107)
CO2: 22.8 mmol/L (ref 20.0–31.0)
CREATININE: 1.09 mg/dL
EGFR CKD-EPI AA MALE: 74 mL/min/{1.73_m2} (ref >=60)
EGFR CKD-EPI NON-AA MALE: 64 mL/min/{1.73_m2} (ref >=60)
POTASSIUM: 4.3 mmol/L (ref 3.4–4.5)
PROTEIN TOTAL: 7.7 g/dL (ref 5.7–8.2)
SODIUM: 137 mmol/L (ref 135–145)

## 2020-10-21 LAB — WBC ADJUSTED: Leukocytes:NCnc:Pt:Bld:Qn:: 10.5

## 2020-10-21 LAB — CALCIUM: Calcium:MCnc:Pt:Ser/Plas:Qn:: 10.1

## 2020-10-21 MED ADMIN — iohexoL (OMNIPAQUE) 350 mg iodine/mL solution 100 mL: 100 mL | INTRAVENOUS | @ 17:00:00 | Stop: 2020-10-21
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
## 2020-10-22 ENCOUNTER — Ambulatory Visit
Admit: 2020-10-22 | Discharge: 2020-10-23 | Payer: MEDICARE | Attending: Hematology & Oncology | Primary: Hematology & Oncology

## 2020-10-22 DIAGNOSIS — C642 Malignant neoplasm of left kidney, except renal pelvis: Principal | ICD-10-CM

## 2020-10-22 DIAGNOSIS — C772 Secondary and unspecified malignant neoplasm of intra-abdominal lymph nodes: Principal | ICD-10-CM

## 2020-10-22 NOTE — Unmapped (Addendum)
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  My recommended treatment plan:  1) Pembrolizumab 200 mg IV every 3 weeks  2) Axitinib 5 mg po bid.  Other regimens are also possible.  After 4 cycles of pembrolizumab, I would recommend repeat scans.    Please call me (Dr. Griffin Basil) through the page operator for any questions.    Hospital Outpatient Visit on 10/21/2020   Component Date Value Ref Range Status   ??? Creatinine Whole Blood, POC 10/21/2020 1.2  0.8 - 1.4 mg/dL Final   ??? EGFR CKD-EPI African American Male 10/21/2020 66  >=60 mL/min/1.67m2 Final   ??? EGFR CKD-EPI Non-African American * 10/21/2020 57* >=60 mL/min/1.33m2 Final   Appointment on 10/21/2020   Component Date Value Ref Range Status   ??? Sodium 10/21/2020 137  135 - 145 mmol/L Final   ??? Potassium 10/21/2020 4.3  3.4 - 4.5 mmol/L Final   ??? Chloride 10/21/2020 107  98 - 107 mmol/L Final   ??? Anion Gap 10/21/2020 7  5 - 14 mmol/L Final   ??? CO2 10/21/2020 22.8  20.0 - 31.0 mmol/L Final   ??? BUN 10/21/2020 12  9 - 23 mg/dL Final   ??? Creatinine 10/21/2020 1.09  0.70 - 1.30 mg/dL Final   ??? BUN/Creatinine Ratio 10/21/2020 11   Final   ??? EGFR CKD-EPI Non-African American,* 10/21/2020 64  >=60 mL/min/1.28m2 Final   ??? EGFR CKD-EPI African American, Male 10/21/2020 74  >=60 mL/min/1.60m2 Final   ??? Glucose 10/21/2020 103  70 - 179 mg/dL Final   ??? Calcium 16/09/9603 10.1  8.7 - 10.4 mg/dL Final   ??? Albumin 54/08/8118 3.7  3.4 - 5.0 g/dL Final   ??? Total Protein 10/21/2020 7.7  5.7 - 8.2 g/dL Final   ??? Total Bilirubin 10/21/2020 0.8  0.3 - 1.2 mg/dL Final   ??? AST 14/78/2956 17  <=34 U/L Final   ??? ALT 10/21/2020 13  10 - 49 U/L Final   ??? Alkaline Phosphatase 10/21/2020 79  46 - 116 U/L Final   ??? WBC 10/21/2020 10.5  3.5 - 10.5 10*9/L Final   ??? RBC 10/21/2020 4.54  4.32 - 5.72 10*12/L Final   ??? HGB 10/21/2020 13.8  13.5 - 17.5 g/dL Final   ??? HCT 21/30/8657 39.7  38.0 - 50.0 % Final   ??? MCV 10/21/2020 87.4  81.0 - 95.0 fL Final   ??? MCH 10/21/2020 30.3  26.0 - 34.0 pg Final   ??? MCHC 10/21/2020 34.7  30.0 - 36.0 g/dL Final   ??? RDW 84/69/6295 13.7  12.0 - 15.0 % Final   ??? MPV 10/21/2020 6.6* 7.0 - 10.0 fL Final   ??? Platelet 10/21/2020 372  150 - 450 10*9/L Final   ??? nRBC 10/21/2020 0  <=4 /100 WBCs Final   ??? Neutrophils % 10/21/2020 70.6  % Final   ??? Lymphocytes % 10/21/2020 15.3  % Final   ??? Monocytes % 10/21/2020 9.7  % Final   ??? Eosinophils % 10/21/2020 4.2  % Final   ??? Basophils % 10/21/2020 0.2  % Final   ??? Absolute Neutrophils 10/21/2020 7.4  1.7 - 7.7 10*9/L Final   ??? Absolute Lymphocytes 10/21/2020 1.6  0.7 - 4.0 10*9/L Final   ??? Absolute Monocytes 10/21/2020 1.0  0.1 - 1.0 10*9/L Final   ??? Absolute Eosinophils 10/21/2020 0.4  0.0 - 0.7 10*9/L Final   ??? Absolute Basophils 10/21/2020 0.0  0.0 - 0.1 10*9/L Final  Please call [phone] to reach my nurse navigator Mauricia Area for any issues.    For emergencies on Nights, Weekends and Holidays  Call [phone] and ask for the hematology/oncology on call.    Griffin Basil, MD, PhD  Associate Professor of Medicine  Division of Hematology-Oncology    [facility]  [facility]  Nurse Navigator: Mauricia Area  Fax: [phone]

## 2020-12-15 ENCOUNTER — Encounter (HOSPITAL_COMMUNITY): Payer: Self-pay

## 2020-12-15 ENCOUNTER — Other Ambulatory Visit: Payer: Self-pay

## 2020-12-15 ENCOUNTER — Inpatient Hospital Stay (HOSPITAL_COMMUNITY)
Admission: EM | Admit: 2020-12-15 | Discharge: 2020-12-23 | DRG: 065 | Disposition: A | Discharge status: Rehabilitation Facility | Payer: Medicare Other | Attending: Internal Medicine | Admitting: Internal Medicine

## 2020-12-15 ENCOUNTER — Emergency Department (HOSPITAL_COMMUNITY): Payer: Medicare Other

## 2020-12-15 DIAGNOSIS — Z7901 Long term (current) use of anticoagulants: Secondary | ICD-10-CM

## 2020-12-15 DIAGNOSIS — R7303 Prediabetes: Secondary | ICD-10-CM | POA: Diagnosis present

## 2020-12-15 DIAGNOSIS — E7800 Pure hypercholesterolemia, unspecified: Secondary | ICD-10-CM | POA: Diagnosis present

## 2020-12-15 DIAGNOSIS — I6340 Cerebral infarction due to embolism of unspecified cerebral artery: Principal | ICD-10-CM | POA: Diagnosis present

## 2020-12-15 DIAGNOSIS — Z6836 Body mass index (BMI) 36.0-36.9, adult: Secondary | ICD-10-CM

## 2020-12-15 DIAGNOSIS — G250 Essential tremor: Secondary | ICD-10-CM | POA: Diagnosis present

## 2020-12-15 DIAGNOSIS — G589 Mononeuropathy, unspecified: Secondary | ICD-10-CM | POA: Diagnosis present

## 2020-12-15 DIAGNOSIS — E785 Hyperlipidemia, unspecified: Secondary | ICD-10-CM | POA: Diagnosis present

## 2020-12-15 DIAGNOSIS — I110 Hypertensive heart disease with heart failure: Secondary | ICD-10-CM | POA: Diagnosis present

## 2020-12-15 DIAGNOSIS — T1490XA Injury, unspecified, initial encounter: Secondary | ICD-10-CM

## 2020-12-15 DIAGNOSIS — G622 Polyneuropathy due to other toxic agents: Secondary | ICD-10-CM | POA: Diagnosis present

## 2020-12-15 DIAGNOSIS — G20 Parkinson's disease: Secondary | ICD-10-CM | POA: Diagnosis present

## 2020-12-15 DIAGNOSIS — R079 Chest pain, unspecified: Secondary | ICD-10-CM

## 2020-12-15 DIAGNOSIS — I4891 Unspecified atrial fibrillation: Secondary | ICD-10-CM | POA: Diagnosis present

## 2020-12-15 DIAGNOSIS — C7989 Secondary malignant neoplasm of other specified sites: Secondary | ICD-10-CM | POA: Diagnosis present

## 2020-12-15 DIAGNOSIS — G4733 Obstructive sleep apnea (adult) (pediatric): Secondary | ICD-10-CM | POA: Diagnosis present

## 2020-12-15 DIAGNOSIS — Z905 Acquired absence of kidney: Secondary | ICD-10-CM

## 2020-12-15 DIAGNOSIS — R5381 Other malaise: Secondary | ICD-10-CM | POA: Diagnosis present

## 2020-12-15 DIAGNOSIS — G629 Polyneuropathy, unspecified: Secondary | ICD-10-CM

## 2020-12-15 DIAGNOSIS — G6182 Multifocal motor neuropathy: Secondary | ICD-10-CM

## 2020-12-15 DIAGNOSIS — R29898 Other symptoms and signs involving the musculoskeletal system: Secondary | ICD-10-CM

## 2020-12-15 DIAGNOSIS — T451X5A Adverse effect of antineoplastic and immunosuppressive drugs, initial encounter: Secondary | ICD-10-CM | POA: Diagnosis present

## 2020-12-15 DIAGNOSIS — E669 Obesity, unspecified: Secondary | ICD-10-CM | POA: Diagnosis present

## 2020-12-15 DIAGNOSIS — E871 Hypo-osmolality and hyponatremia: Secondary | ICD-10-CM

## 2020-12-15 DIAGNOSIS — N400 Enlarged prostate without lower urinary tract symptoms: Secondary | ICD-10-CM | POA: Diagnosis present

## 2020-12-15 DIAGNOSIS — G610 Guillain-Barre syndrome: Secondary | ICD-10-CM

## 2020-12-15 DIAGNOSIS — R1900 Intra-abdominal and pelvic swelling, mass and lump, unspecified site: Secondary | ICD-10-CM | POA: Diagnosis not present

## 2020-12-15 DIAGNOSIS — I482 Chronic atrial fibrillation, unspecified: Secondary | ICD-10-CM | POA: Diagnosis present

## 2020-12-15 DIAGNOSIS — Z823 Family history of stroke: Secondary | ICD-10-CM

## 2020-12-15 DIAGNOSIS — I5032 Chronic diastolic (congestive) heart failure: Secondary | ICD-10-CM | POA: Diagnosis present

## 2020-12-15 DIAGNOSIS — I639 Cerebral infarction, unspecified: Secondary | ICD-10-CM | POA: Diagnosis present

## 2020-12-15 DIAGNOSIS — Z20822 Contact with and (suspected) exposure to covid-19: Secondary | ICD-10-CM | POA: Diagnosis present

## 2020-12-15 DIAGNOSIS — Z79899 Other long term (current) drug therapy: Secondary | ICD-10-CM

## 2020-12-15 DIAGNOSIS — Z87891 Personal history of nicotine dependence: Secondary | ICD-10-CM

## 2020-12-15 DIAGNOSIS — Z96651 Presence of right artificial knee joint: Secondary | ICD-10-CM | POA: Diagnosis present

## 2020-12-15 DIAGNOSIS — Z888 Allergy status to other drugs, medicaments and biological substances status: Secondary | ICD-10-CM

## 2020-12-15 DIAGNOSIS — F4310 Post-traumatic stress disorder, unspecified: Secondary | ICD-10-CM | POA: Diagnosis present

## 2020-12-15 DIAGNOSIS — C649 Malignant neoplasm of unspecified kidney, except renal pelvis: Secondary | ICD-10-CM

## 2020-12-15 LAB — CBC
HCT: 40.6 % (ref 39.0–52.0)
Hemoglobin: 12.7 g/dL — ABNORMAL LOW (ref 13.0–17.0)
MCH: 28.8 pg (ref 26.0–34.0)
MCHC: 31.3 g/dL (ref 30.0–36.0)
MCV: 92.1 fL (ref 80.0–100.0)
Platelets: 390 10*3/uL (ref 150–400)
RBC: 4.41 MIL/uL (ref 4.22–5.81)
RDW: 13.5 % (ref 11.5–15.5)
WBC: 10.1 10*3/uL (ref 4.0–10.5)
nRBC: 0 % (ref 0.0–0.2)

## 2020-12-15 LAB — URINALYSIS, ROUTINE W REFLEX MICROSCOPIC
Bacteria, UA: NONE SEEN
Bilirubin Urine: NEGATIVE
Glucose, UA: NEGATIVE mg/dL
Hgb urine dipstick: NEGATIVE
Ketones, ur: NEGATIVE mg/dL
Nitrite: NEGATIVE
Protein, ur: NEGATIVE mg/dL
Specific Gravity, Urine: 1.013 (ref 1.005–1.030)
pH: 6 (ref 5.0–8.0)

## 2020-12-15 LAB — BASIC METABOLIC PANEL
Anion gap: 11 (ref 5–15)
BUN: 14 mg/dL (ref 8–23)
CO2: 20 mmol/L — ABNORMAL LOW (ref 22–32)
Calcium: 10.2 mg/dL (ref 8.9–10.3)
Chloride: 103 mmol/L (ref 98–111)
Creatinine, Ser: 1.04 mg/dL (ref 0.61–1.24)
GFR, Estimated: >60 mL/min (ref >60)
Glucose, Bld: 100 mg/dL — ABNORMAL HIGH (ref 70–99)
Potassium: 4.4 mmol/L (ref 3.5–5.1)
Sodium: 134 mmol/L — ABNORMAL LOW (ref 135–145)

## 2020-12-15 MED ORDER — IOHEXOL 300 MG/ML  SOLN
100.0000 mL | Freq: Once | INTRAMUSCULAR | Status: AC | PRN
  Administered 2020-12-15: 100 mL via INTRAVENOUS

## 2020-12-15 NOTE — ED Provider Notes (Addendum)
[name] EMERGENCY DEPARTMENT Provider Note   CSN: [id number] Arrival date & time: 12/15/20  1533     History Chief Complaint  Patient presents with  . Weakness    Evan Kirk is a 79 y.o. male.  Evan Kirk with a chief complaints of leg weakness. Going on for about 48 hours. Feels like his legs are really wobbly and unable to hold his weight. Has been able to ambulate some with a walker. He called his oncologist because he is currently undergoing chemotherapy for renal cell carcinoma recurrence. Was in remission status post nephrectomy. Over time he has had recurrence initially in his lymph nodes and now has a mass that has wrapped around his aorta and vena cava. He has been having some mid to low thoracic back pain across his back. Denies abdominal pain. Feels like his legs are weak worse on the right than the left.  Recently started chemo through the [place] on axitinib and Prembrolizumab  The history is provided by the patient.  Weakness Severity:  Moderate Onset quality:  Gradual Duration:  2 days Timing:  Constant Progression:  Worsening Chronicity:  New Relieved by:  Nothing Worsened by:  Standing (ambulation) Ineffective treatments:  None tried Associated symptoms: no abdominal pain, no arthralgias, no chest pain, no diarrhea, no fever, no headaches, no myalgias, no shortness of breath and no vomiting        Past Medical History:  Diagnosis Date  . Anticoagulated on Coumadin    followed by [facility]  . Atrial fibrillation ([place])   . Back pain   . Bladder mass   . BPH (benign prostatic hypertrophy)   . Chronic atrial fibrillation ([place])   . Diastolic CHF, chronic [facility])    cardiologist-- dr Daneen Schick-- asymptomatic  . Diverticulosis of colon   . Edema   . Essential tremor 06-2015  . History of urinary retention   . HTN (hypertension)   . OSA on CPAP   . Peripheral neuropathy    BILATERAL FEET  . Prediabetes   . PTSD  (post-traumatic stress disorder)   . Renal cell carcinoma of left kidney [facility])     Patient Active Problem List   Diagnosis Date Noted  . Acute idiopathic gout of right foot 01/04/2019  . 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 Parkinson disease ([place]) 01/20/2015  . Chronic diastolic heart failure ([place]) 01/16/2014  . HTN (hypertension)   . Hypercholesteremia   . Back pain   . Atrial fibrillation ([place])   . Obesity   . Diverticulitis of colon (without mention of hemorrhage)(562.11)   . OSA (obstructive sleep apnea)     Past Surgical History:  Procedure Laterality Date  . CYSTOSCOPY WITH BIOPSY N/A 10/09/2015   Procedure: CYSTOSCOPY WITH BIOPSY;  Surgeon: Cleon Gustin, MD;  Location[facility];  Service: Urology;  Laterality: N/A;  . NEPHRECTOMY Left   . NEPHRECTOMY RADICAL Left 08/13/2015   [facility]-  . TOTAL KNEE ARTHROPLASTY Right 11-10-2008  . TRANSTHORACIC ECHOCARDIOGRAM  08-12-2015      moderate concentric LVH, ef 50-55-%/  trivial MR , PR and TR/  severe LAE and RAE  . VASECTOMY REVERSAL  1978       Family History  Problem Relation Age of Onset  . Stroke Mother     Social History   Tobacco Use  . Smoking status: Former Smoker    Quit date: 04/18/1966    Years since  quitting: 54.6  . Smokeless tobacco: Former Network engineer Use Topics  . Alcohol use: No    Alcohol/week: 0.0 standard drinks  . Drug use: No    Home Medications Prior to Admission medications   Medication Sig Start Date End Date Taking? Authorizing Provider  acetaminophen (TYLENOL) 500 MG tablet Take 500 mg by mouth every 6 (six) hours as needed for mild pain.   Yes [provider]  apixaban (ELIQUIS) 5 MG TABS tablet Take 5 mg by mouth 2 (two) times daily.   Yes [provider]  benzoyl peroxide 5 % gel Apply 1 application topically daily as needed (skin irritation).   Yes [provider]  carboxymethylcellulose (REFRESH PLUS) 0.5 % SOLN  Place 1 drop into both eyes 3 (three) times daily as needed (dry eyes).   Yes [provider]  cholecalciferol (VITAMIN D) 1000 UNITS tablet Take 2,000 Units by mouth daily.   Yes [provider]  hydrocortisone 2.5 % lotion Apply topically 2 (two) times daily.   Yes [provider]  ketoconazole (NIZORAL) 2 % shampoo Apply 1 application topically 2 (two) times a week.   Yes [provider]  loratadine (CLARITIN) 10 MG tablet Take 10 mg by mouth daily as needed for allergies.    Yes [provider]  metoprolol (LOPRESSOR) 50 MG tablet Take 0.5 tablets (25 mg total) by mouth 2 (two) times daily. 10/29/13  Yes Belva Crome, MD  metroNIDAZOLE (FLAGYL) 500 MG tablet Take 500 mg by mouth 3 (three) times daily. 06/04/20  Yes [provider]  metroNIDAZOLE (METROCREAM) 0.75 % cream Apply 1 application topically daily as needed. (roscea)   Yes [provider]  Multiple Vitamin (MULTIVITAMIN) capsule Take 1 capsule by mouth daily.   Yes [provider]  NON FORMULARY Peripheral Neuropathy Cream: Bupivacaine 1% Doxepin 3% Gabapentin 6% Pentoxifyline 3% Topiramate 1% Sig: Apply 1 to 2 gm to affected areas 3-4x daily prn  Refill 1   Yes [provider]  OVER THE COUNTER MEDICATION Take 2 capsules by mouth at bedtime. Med Name: INTESTINAL FORMULA #1   Yes [provider]  senna (SENOKOT) 8.6 MG tablet Take 1 tablet by mouth at bedtime.   Yes [provider]  traZODone (DESYREL) 100 MG tablet Take 100 mg by mouth at bedtime as needed for sleep.    Yes [provider]  cephALEXin (KEFLEX) 500 MG capsule Take 1 capsule (500 mg total) by mouth 3 (three) times daily. Patient not taking: No sig reported 11/13/19   Trula Slade, DPM  methylPREDNISolone (MEDROL DOSEPAK) 4 MG TBPK tablet Take as directed Patient not taking: Reported on 12/15/2020 01/04/19   Trula Slade, DPM  traMADol (ULTRAM) 50 MG  tablet Take 1 tablet (50 mg total) by mouth every 6 (six) hours as needed. Patient not taking: Reported on 12/15/2020 10/09/15   Cleon Gustin, MD    Allergies    Diltiazem  Review of Systems   Review of Systems  Constitutional: Negative for chills and fever.  HENT: Negative for congestion and facial swelling.   Eyes: Negative for discharge and visual disturbance.  Respiratory: Negative for shortness of breath.   Cardiovascular: Negative for chest pain and palpitations.  Gastrointestinal: Negative for abdominal pain, diarrhea and vomiting.  Musculoskeletal: Positive for back pain. Negative for arthralgias and myalgias.  Skin: Negative for color change and rash.  Neurological: Positive for weakness. Negative for tremors, syncope and headaches.  Psychiatric/Behavioral: Negative  for confusion and dysphoric mood.    Physical Exam Updated Vital Signs BP (!) 111/53   Pulse 62   Temp 98.4 F (36.9 C) (Oral)   Resp 17   Ht 5\' 6"  (1.676 Kirk)   Wt 102.1 kg   SpO2 95%   BMI 36.32 kg/Kirk   Physical Exam Vitals and nursing note reviewed.  Constitutional:      Appearance: He is well-developed and well-nourished.  HENT:     Head: Normocephalic and atraumatic.  Eyes:     Extraocular Movements: EOM normal.     Pupils: Pupils are equal, round, and reactive to light.  Neck:     Vascular: No JVD.  Cardiovascular:     Rate and Rhythm: Normal rate and regular rhythm.     Heart sounds: No murmur heard. No friction rub. No gallop.   Pulmonary:     Effort: No respiratory distress.     Breath sounds: No wheezing.  Abdominal:     General: There is no distension.     Tenderness: There is no abdominal tenderness. There is no guarding or rebound.  Musculoskeletal:        General: Normal range of motion.     Cervical back: Normal range of motion and neck supple.  Skin:    Coloration: Skin is not pale.     Findings: No rash.  Neurological:     Mental Status: He is alert and oriented  to person, place, and time.     Comments: Diminished reflexes to both lower extremities. No clonus. Negative Babinski. 4-5 muscle strength to the right lower extremity compared to 5 out of 5 to the left. No appreciable weakness to the upper extremities.  Sensation to light touch intact in all nerve distributions to the foot however the patient feels like him touching him through a thick sock.  Psychiatric:        Mood and Affect: Mood and affect normal.        Behavior: Behavior normal.     ED Results / Procedures / Treatments   Labs (all labs ordered are listed, but only abnormal results are displayed) Labs Reviewed  BASIC METABOLIC PANEL - Abnormal; Notable for the following components:      Result Value   Sodium 134 (*)    CO2 20 (*)    Glucose, Bld 100 (*)    All other components within normal limits  CBC - Abnormal; Notable for the following components:   Hemoglobin 12.7 (*)    All other components within normal limits  URINALYSIS, ROUTINE W REFLEX MICROSCOPIC - Abnormal; Notable for the following components:   Leukocytes,Ua SMALL (*)    All other components within normal limits  RESP PANEL BY RT-PCR (FLU A&B, COVID) ARPGX2  CBG MONITORING, ED    EKG EKG Interpretation  Date/Time:  Tuesday December 15 2020 16:50:55 EST Ventricular Rate:  58 PR Interval:    QRS Duration: 78 QT Interval:  412 QTC Calculation: 404 R Axis:   -41 Text Interpretation: Atrial fibrillation with slow ventricular response Left axis deviation Low voltage QRS Inferior infarct , age undetermined Cannot rule out Anterior infarct , age undetermined Abnormal ECG Otherwise no significant change Confirmed by Deno Etienne [phone]) on 12/15/2020 5:59:46 PM   Radiology CT Head Wo Contrast  Result Date: 12/15/2020 CLINICAL DATA:  weakness EXAM: CT HEAD WITHOUT CONTRAST TECHNIQUE: Contiguous axial images were obtained from the base of the skull through the vertex without intravenous contrast. COMPARISON:  None. FINDINGS: Brain: No evidence of acute territorial infarction, hemorrhage, hydrocephalus,extra-axial collection or mass lesion/mass effect. There is dilatation the ventricles and sulci consistent with age-related atrophy. Low-attenuation changes in the deep white matter consistent with small vessel ischemia. Vascular: No hyperdense vessel or unexpected calcification. Skull: The skull is intact. No fracture or focal lesion identified. Sinuses/Orbits: The visualized paranasal sinuses and mastoid air cells are clear. The orbits and globes intact. Other: None IMPRESSION: No acute intracranial abnormality. Findings consistent with age related atrophy and chronic small vessel ischemia Electronically Signed   By: Prudencio Pair Kirk.D.   On: 12/15/2020 21:11   CT CHEST ABDOMEN PELVIS W CONTRAST  Result Date: 12/15/2020 CLINICAL DATA:  Renal cell carcinoma abdominal mass. started Chemo 12/22 sent here by [facility] for further evaluation EXAM: CT CHEST, ABDOMEN, AND PELVIS WITH CONTRAST TECHNIQUE: Multidetector CT imaging of the chest, abdomen and pelvis was performed following the standard protocol during bolus administration of intravenous contrast. CONTRAST:  124mL OMNIPAQUE IOHEXOL 300 MG/ML  SOLN COMPARISON:  MRI abdomen 07/20/2015, CT abdomen pelvis 07/06/2015. FINDINGS: CT CHEST FINDINGS Cardiovascular: Left atrial enlargement. The remainder of the heart is normal in caliber. For. No significant pericardial effusion. The thoracic aorta is normal in caliber. At least moderate atherosclerotic plaque of the thoracic aorta. At least moderate three-vessel coronary artery calcifications. The main pulmonary artery is normal in caliber. No central pulmonary embolus. Mediastinum/Nodes: No enlarged mediastinal, hilar, or axillary lymph nodes. Thyroid gland, trachea, and esophagus demonstrate no significant findings. Lungs/Pleura: Bilateral lower lobe subsegmental atelectasis. Lingular atelectasis. No focal  consolidation. There is a round solid 4 mm nodule within the left upper lobe. Micronodule within the right upper lobe (5:38). Micronodule within the right lower lobe (5:80). Subpleural micronodule within the right upper lobe (5:64). No pulmonary mass. No pleural effusion. No pneumothorax. Musculoskeletal: Slightly asymmetric gynecomastia, right greater than left. No suspicious lytic or blastic osseous lesions. No acute displaced fracture. Multilevel degenerative changes of the spine. Please see separately dictated CT thoracolumbar spine 12/15/2020. CT ABDOMEN PELVIS FINDINGS Hepatobiliary: No focal liver abnormality. At least 2.8 cm peripherally calcified gallstone within the gallbladder lumen. No associated gallbladder wall thickening or pericholecystic fluid. No biliary dilatation. Pancreas: No focal lesion. Normal pancreatic contour. No surrounding inflammatory changes. No main pancreatic ductal dilatation. Spleen: Normal in size without focal abnormality. Adrenals/Urinary Tract: No adrenal nodule bilaterally. Status post left nephrectomy. The right kidney enhances homogeneous Lea. No nephrolithiasis. No hydronephrosis. No hydroureter. The urinary bladder is unremarkable. Status post Stomach/Bowel: Stomach is within normal limits. No evidence of small bowel wall thickening or dilatation. Diffuse sigmoid and descending colon diverticulosis this cysts with slightly increased trace pericolonic fat stranding but no definite bowel wall thickening. The appendix appears normal. Vascular/Lymphatic: No abdominal aorta or iliac aneurysm. Severe calcified and noncalcified atherosclerotic plaque of the aorta and its branches. Suggestion of prior retroperitoneal lymph node dissection. Heterogeneous lobulated 7.6 x 7.7 x 11.9 cm retroperitoneal mass that is noted to be inseparable from the inferior vena cava, third portion of the duodenum, aorta, and origin/proximal inferior mesenteric artery. No pelvic or inguinal  lymphadenopathy. The mass is noted to encase the inferior vena cava. Intraluminal extension of the mass not excluded. No findings suggest extension of the mass into the right renal vein or right cardiac atrium. Reproductive: Prostate is unremarkable. Other: Trace simple free fluid within the abdomen pelvis. No intraperitoneal free gas. No organized fluid collection. Musculoskeletal: No abdominal wall hernia or abnormality. No suspicious lytic or  blastic osseous lesions. No acute displaced fracture. Multilevel degenerative changes of the spine. Please see separately dictated CT thoracolumbar spine 12/15/2020. IMPRESSION: 1. A 7.6 x 7.7 x 11.9 cm heterogeneous retroperitoneal mass is inseparable from the inferior vena cava, third portion of the duodenum, aorta, and origin/proximal inferior mesenteric artery. The mass is noted to encase the inferior vena cava with intraluminal extension not excluded. 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 2. Left atrial enlargement. 3. Diffuse descending colon and sigmoid diverticulosis with no CT findings of definite acute diverticulitis. 4. Cholelithiasis with no CT findings of acute cholecystitis or choledocholithiasis. 5. Slightly asymmetric gynecomastia, right greater than left. Recommend mammographic evaluation. 6.  Aortic Atherosclerosis (ICD10-I70.0). 7. Please see separately dictated CT thoracolumbar spine 12/15/2020. Electronically Signed   By: Tish Frederickson Kirk.D.   On: 12/15/2020 21:37   CT T-SPINE NO CHARGE  Result Date: 12/15/2020 CLINICAL DATA:  Bilateral lower extremity weakness. EXAM: CT Thoracic and Lumbar spine without contrast TECHNIQUE: Multiplanar CT images of the thoracic and lumbar spine were reconstructed from contemporary CT of the Chest, Abdomen, and Pelvis CONTRAST:  No additional contrast administered COMPARISON:  None FINDINGS: CT THORACIC SPINE FINDINGS Alignment: Mild  dextroscoliosis. Vertebrae: No acute fracture or focal pathologic process. Paraspinal and other soft tissues: Please see report for concomitant CT of the chest, abdomen and pelvis. Disc levels: There is no spinal canal stenosis. CT LUMBAR SPINE FINDINGS Segmentation: Standard Alignment: Normal Vertebrae: No acute fracture or focal pathologic process. Paraspinal and other soft tissues: Retroperitoneal mass better characterized on concomitant CT of the chest, abdomen and pelvis. Disc levels: There is moderate L4-5 facet arthrosis. No lumbar spinal canal stenosis or neural impingement. IMPRESSION: 1. No acute abnormality of the thoracic or lumbar spine. 2. Retroperitoneal mass better characterized on concomitant CT of the chest, abdomen and pelvis. Electronically Signed   By: Deatra Robinson Kirk.D.   On: 12/15/2020 21:20   CT L-SPINE NO CHARGE  Result Date: 12/15/2020 CLINICAL DATA:  Bilateral lower extremity weakness. EXAM: CT Thoracic and Lumbar spine without contrast TECHNIQUE: Multiplanar CT images of the thoracic and lumbar spine were reconstructed from contemporary CT of the Chest, Abdomen, and Pelvis CONTRAST:  No additional contrast administered COMPARISON:  None FINDINGS: CT THORACIC SPINE FINDINGS Alignment: Mild dextroscoliosis. Vertebrae: No acute fracture or focal pathologic process. Paraspinal and other soft tissues: Please see report for concomitant CT of the chest, abdomen and pelvis. Disc levels: There is no spinal canal stenosis. CT LUMBAR SPINE FINDINGS Segmentation: Standard Alignment: Normal Vertebrae: No acute fracture or focal pathologic process. Paraspinal and other soft tissues: Retroperitoneal mass better characterized on concomitant CT of the chest, abdomen and pelvis. Disc levels: There is moderate L4-5 facet arthrosis. No lumbar spinal canal stenosis or neural impingement. IMPRESSION: 1. No acute abnormality of the thoracic or lumbar spine. 2. Retroperitoneal mass better characterized on  concomitant CT of the chest, abdomen and pelvis. Electronically Signed   By: Deatra Robinson Kirk.D.   On: 12/15/2020 21:20    Procedures Procedures (including critical care time)  Medications Ordered in ED Medications  iohexol (OMNIPAQUE) 300 MG/ML solution 100 mL (100 mLs Intravenous Contrast Given 12/15/20 2103)    ED Course  I have reviewed the triage vital signs and the nursing notes.  Pertinent labs & imaging results that were available during my care of the patient were reviewed by me and considered in my medical decision making (see chart for details).  MDM Rules/Calculators/A&P                          79 yo Kirk with a chief complaint of bilateral lower extremity weakness right greater than left. No recent illness no recent immunization is undergoing chemotherapy for recurrence of his renal cell carcinoma. Call the doctors at the [place] who suggested he come here. No fevers. No known spinal involvement per him.  I attempted to get in touch with the doctors at the [facility] unfortunately it is after hours and I'Kirk unable to get in touch even with an operator. I discussed the case with Dr. Malen Gauze, neurology recommended CT scan of the head as well as the T and L-spine. Likely would also need follow-up MRI of the brain and T and L.  CT scan consistent with what was previously reported patient has a large mass that seems to be intertwined with his aorta and inferior vena cava.  No obvious findings in the thoracic or lumbar spine.  CT the head is negative for acute hemorrhage.  MRI is ordered.  At this point I feel is unlikely the patient will be discharged home with progressive lower extremity weakness discussed with the hospitalist for admission.  CRITICAL CARE Performed by: Cecilio Asper   Total critical care time: 35 minutes  Critical care time was exclusive of separately billable procedures and treating other patients.  Critical care was necessary to treat or prevent  imminent or life-threatening deterioration.  Critical care was time spent personally by me on the following activities: development of treatment plan with patient and/or surrogate as well as nursing, discussions with consultants, evaluation of patient's response to treatment, examination of patient, obtaining history from patient or surrogate, ordering and performing treatments and interventions, ordering and review of laboratory studies, ordering and review of radiographic studies, pulse oximetry and re-evaluation of patient's condition.   The patients results and plan were reviewed and discussed.   Any x-rays performed were independently reviewed by myself.   Differential diagnosis were considered with the presenting HPI.  Medications  iohexol (OMNIPAQUE) 300 MG/ML solution 100 mL (100 mLs Intravenous Contrast Given 12/15/20 2103)    Vitals:   12/15/20 2130 12/15/20 2215 12/15/20 2230 12/15/20 2245  BP: (!) 107/59 (!) 116/57 (!) 118/56 (!) 111/53  Pulse: 69 (!) 55 66 62  Resp: (!) 22 (!) 22 17 17   Temp:      TempSrc:      SpO2: 94% 94% 96% 95%  Weight:      Height:        Final diagnoses:  Renal cell carcinoma (HCC)  Retroperitoneal mass  Leg weakness, bilateral    Admission/ observation were discussed with the admitting physician, patient and/or family and they are comfortable with the plan.   Final Clinical Impression(s) / ED Diagnoses Final diagnoses:  Renal cell carcinoma ([place])  Retroperitoneal mass  Leg weakness, bilateral    Rx / DC Orders ED Discharge Orders    None       Deno Etienne, DO 12/15/20 [place], [place], DO 12/15/20 2306

## 2020-12-15 NOTE — ED Triage Notes (Signed)
Pt presents POV with BLE feeling like rubber yesterday afternoon. [place] both feet feel like lead and are burning from neuropathy. Pt started Chemo 12/22 sent here by [facility] for further evaluation

## 2020-12-15 NOTE — ED Notes (Signed)
Pt states he started having bilateral leg pain that's become worse since yesterday. States he has to use walker now bc he cant hold himself up well. Pt also complaining of swelling to lower legs

## 2020-12-16 ENCOUNTER — Inpatient Hospital Stay (HOSPITAL_COMMUNITY): Payer: Medicare Other

## 2020-12-16 ENCOUNTER — Other Ambulatory Visit (HOSPITAL_COMMUNITY): Payer: No Typology Code available for payment source

## 2020-12-16 DIAGNOSIS — C7989 Secondary malignant neoplasm of other specified sites: Secondary | ICD-10-CM | POA: Diagnosis not present

## 2020-12-16 DIAGNOSIS — G629 Polyneuropathy, unspecified: Secondary | ICD-10-CM | POA: Diagnosis not present

## 2020-12-16 DIAGNOSIS — I482 Chronic atrial fibrillation, unspecified: Secondary | ICD-10-CM | POA: Diagnosis not present

## 2020-12-16 DIAGNOSIS — I350 Nonrheumatic aortic (valve) stenosis: Secondary | ICD-10-CM | POA: Diagnosis not present

## 2020-12-16 DIAGNOSIS — I639 Cerebral infarction, unspecified: Secondary | ICD-10-CM | POA: Diagnosis not present

## 2020-12-16 DIAGNOSIS — R5381 Other malaise: Secondary | ICD-10-CM | POA: Diagnosis present

## 2020-12-16 DIAGNOSIS — C649 Malignant neoplasm of unspecified kidney, except renal pelvis: Secondary | ICD-10-CM | POA: Diagnosis not present

## 2020-12-16 DIAGNOSIS — C641 Malignant neoplasm of right kidney, except renal pelvis: Secondary | ICD-10-CM | POA: Diagnosis not present

## 2020-12-16 DIAGNOSIS — I6340 Cerebral infarction due to embolism of unspecified cerebral artery: Secondary | ICD-10-CM | POA: Diagnosis not present

## 2020-12-16 DIAGNOSIS — C642 Malignant neoplasm of left kidney, except renal pelvis: Secondary | ICD-10-CM | POA: Diagnosis not present

## 2020-12-16 DIAGNOSIS — G589 Mononeuropathy, unspecified: Secondary | ICD-10-CM | POA: Diagnosis not present

## 2020-12-16 DIAGNOSIS — R1900 Intra-abdominal and pelvic swelling, mass and lump, unspecified site: Secondary | ICD-10-CM | POA: Diagnosis present

## 2020-12-16 DIAGNOSIS — N400 Enlarged prostate without lower urinary tract symptoms: Secondary | ICD-10-CM | POA: Diagnosis present

## 2020-12-16 DIAGNOSIS — K5901 Slow transit constipation: Secondary | ICD-10-CM | POA: Diagnosis not present

## 2020-12-16 DIAGNOSIS — I5032 Chronic diastolic (congestive) heart failure: Secondary | ICD-10-CM | POA: Diagnosis not present

## 2020-12-16 DIAGNOSIS — G250 Essential tremor: Secondary | ICD-10-CM | POA: Diagnosis present

## 2020-12-16 DIAGNOSIS — E669 Obesity, unspecified: Secondary | ICD-10-CM | POA: Diagnosis present

## 2020-12-16 DIAGNOSIS — I6389 Other cerebral infarction: Secondary | ICD-10-CM | POA: Diagnosis not present

## 2020-12-16 DIAGNOSIS — I110 Hypertensive heart disease with heart failure: Secondary | ICD-10-CM | POA: Diagnosis not present

## 2020-12-16 DIAGNOSIS — T451X5A Adverse effect of antineoplastic and immunosuppressive drugs, initial encounter: Secondary | ICD-10-CM | POA: Diagnosis not present

## 2020-12-16 DIAGNOSIS — G4733 Obstructive sleep apnea (adult) (pediatric): Secondary | ICD-10-CM | POA: Diagnosis present

## 2020-12-16 DIAGNOSIS — G20 Parkinson's disease: Secondary | ICD-10-CM | POA: Diagnosis present

## 2020-12-16 DIAGNOSIS — R7303 Prediabetes: Secondary | ICD-10-CM | POA: Diagnosis present

## 2020-12-16 DIAGNOSIS — E785 Hyperlipidemia, unspecified: Secondary | ICD-10-CM | POA: Diagnosis present

## 2020-12-16 DIAGNOSIS — I69392 Facial weakness following cerebral infarction: Secondary | ICD-10-CM | POA: Diagnosis not present

## 2020-12-16 DIAGNOSIS — E7800 Pure hypercholesterolemia, unspecified: Secondary | ICD-10-CM | POA: Diagnosis present

## 2020-12-16 DIAGNOSIS — Z6836 Body mass index (BMI) 36.0-36.9, adult: Secondary | ICD-10-CM | POA: Diagnosis not present

## 2020-12-16 DIAGNOSIS — R29898 Other symptoms and signs involving the musculoskeletal system: Secondary | ICD-10-CM | POA: Diagnosis not present

## 2020-12-16 DIAGNOSIS — G622 Polyneuropathy due to other toxic agents: Secondary | ICD-10-CM | POA: Diagnosis not present

## 2020-12-16 DIAGNOSIS — Z96651 Presence of right artificial knee joint: Secondary | ICD-10-CM | POA: Diagnosis present

## 2020-12-16 DIAGNOSIS — F4310 Post-traumatic stress disorder, unspecified: Secondary | ICD-10-CM | POA: Diagnosis present

## 2020-12-16 DIAGNOSIS — G6182 Multifocal motor neuropathy: Secondary | ICD-10-CM | POA: Diagnosis not present

## 2020-12-16 DIAGNOSIS — Z20822 Contact with and (suspected) exposure to covid-19: Secondary | ICD-10-CM | POA: Diagnosis not present

## 2020-12-16 DIAGNOSIS — E871 Hypo-osmolality and hyponatremia: Secondary | ICD-10-CM | POA: Diagnosis not present

## 2020-12-16 LAB — ECHOCARDIOGRAM COMPLETE BUBBLE STUDY
AR max vel: 1.62 cm2
AV Area VTI: 1.73 cm2
AV Area mean vel: 1.8 cm2
AV Mean grad: 8.2 mmHg
AV Peak grad: 18.6 mmHg
Ao pk vel: 2.16 m/s
S' Lateral: 2.7 cm

## 2020-12-16 LAB — RESP PANEL BY RT-PCR (FLU A&B, COVID) ARPGX2
Influenza A by PCR: NEGATIVE
Influenza B by PCR: NEGATIVE
SARS Coronavirus 2 by RT PCR: NEGATIVE

## 2020-12-16 LAB — LIPID PANEL
Cholesterol: 140 mg/dL (ref 0–200)
HDL: 33 mg/dL — ABNORMAL LOW (ref >40)
LDL Cholesterol: 92 mg/dL (ref 0–99)
Total CHOL/HDL Ratio: 4.2 RATIO
Triglycerides: 77 mg/dL (ref <150)
VLDL: 15 mg/dL (ref 0–40)

## 2020-12-16 LAB — HEMOGLOBIN A1C
Hgb A1c MFr Bld: 5.5 % (ref 4.8–5.6)
Mean Plasma Glucose: 111.15 mg/dL

## 2020-12-16 LAB — TSH: TSH: 1.535 u[IU]/mL (ref 0.350–4.500)

## 2020-12-16 LAB — FOLATE: Folate: 12.5 ng/mL (ref >5.9)

## 2020-12-16 LAB — VITAMIN B12: Vitamin B-12: 1048 pg/mL — ABNORMAL HIGH (ref 180–914)

## 2020-12-16 MED ORDER — IOHEXOL 350 MG/ML SOLN
75.0000 mL | Freq: Once | INTRAVENOUS | Status: AC | PRN
  Administered 2020-12-19: 75 mL via INTRAVENOUS

## 2020-12-16 MED ORDER — SENNA 8.6 MG PO TABS
1.0000 | ORAL_TABLET | Freq: Every day | ORAL | Status: DC
  Administered 2020-12-16 – 2020-12-22 (×7): 8.6 mg via ORAL
  Filled 2020-12-16 (×7): qty 1

## 2020-12-16 MED ORDER — TRAZODONE HCL 100 MG PO TABS: 100.0000 mg | ORAL_TABLET | Freq: Every evening | ORAL | Status: DC | PRN

## 2020-12-16 MED ORDER — KETOCONAZOLE 2 % EX SHAM
1.0000 "application " | MEDICATED_SHAMPOO | CUTANEOUS | Status: DC
  Administered 2020-12-21: 1 via TOPICAL
  Filled 2020-12-16: qty 120

## 2020-12-16 MED ORDER — GADOBUTROL 1 MMOL/ML IV SOLN
10.0000 mL | Freq: Once | INTRAVENOUS | Status: AC | PRN
  Administered 2020-12-16: 10 mL via INTRAVENOUS

## 2020-12-16 MED ORDER — LORATADINE 10 MG PO TABS: 10.0000 mg | ORAL_TABLET | Freq: Every day | ORAL | Status: DC | PRN

## 2020-12-16 MED ORDER — METOPROLOL TARTRATE 25 MG PO TABS
25.0000 mg | ORAL_TABLET | Freq: Two times a day (BID) | ORAL | Status: DC
  Administered 2020-12-16 – 2020-12-20 (×9): 25 mg via ORAL
  Filled 2020-12-16 (×10): qty 1

## 2020-12-16 MED ORDER — APIXABAN 5 MG PO TABS
5.0000 mg | ORAL_TABLET | Freq: Two times a day (BID) | ORAL | Status: DC
  Administered 2020-12-16: 5 mg via ORAL
  Filled 2020-12-16: qty 1

## 2020-12-16 MED ORDER — VITAMIN D 25 MCG (1000 UNIT) PO TABS
2000.0000 [IU] | ORAL_TABLET | Freq: Every day | ORAL | Status: DC
  Administered 2020-12-16 – 2020-12-23 (×8): 2000 [IU] via ORAL
  Filled 2020-12-16 (×9): qty 2

## 2020-12-16 MED ORDER — POLYVINYL ALCOHOL 1.4 % OP SOLN
1.0000 [drp] | Freq: Three times a day (TID) | OPHTHALMIC | Status: DC | PRN
  Administered 2020-12-22: 1 [drp] via OPHTHALMIC
  Filled 2020-12-16: qty 15

## 2020-12-16 MED ORDER — HYDROCORTISONE (PERIANAL) 2.5 % EX CREA
TOPICAL_CREAM | Freq: Two times a day (BID) | CUTANEOUS | Status: DC
  Administered 2020-12-20: 1 via TOPICAL
  Filled 2020-12-16 (×2): qty 28.35

## 2020-12-16 MED ORDER — HEPARIN (PORCINE) 25000 UT/250ML-% IV SOLN
1700.0000 [IU]/h | INTRAVENOUS | Status: DC
  Administered 2020-12-16 – 2020-12-17 (×2): 1350 [IU]/h via INTRAVENOUS
  Administered 2020-12-18: 1400 [IU]/h via INTRAVENOUS
  Administered 2020-12-18: 1350 [IU]/h via INTRAVENOUS
  Filled 2020-12-16 (×7): qty 250

## 2020-12-16 MED ORDER — ADULT MULTIVITAMIN W/MINERALS CH
1.0000 | ORAL_TABLET | Freq: Every day | ORAL | Status: DC
  Administered 2020-12-16 – 2020-12-23 (×8): 1 via ORAL
  Filled 2020-12-16 (×8): qty 1

## 2020-12-16 MED ORDER — BENZOYL PEROXIDE 5 % EX GEL: 1.0000 "application " | Freq: Every day | CUTANEOUS | Status: DC | PRN

## 2020-12-16 MED ORDER — IOHEXOL 350 MG/ML SOLN
75.0000 mL | Freq: Once | INTRAVENOUS | Status: AC | PRN
  Administered 2020-12-16: 75 mL via INTRAVENOUS

## 2020-12-16 MED ORDER — STROKE: EARLY STAGES OF RECOVERY BOOK
Freq: Once | Status: AC
  Filled 2020-12-16: qty 1

## 2020-12-16 MED ORDER — PREGABALIN 25 MG PO CAPS
25.0000 mg | ORAL_CAPSULE | Freq: Two times a day (BID) | ORAL | Status: DC
  Administered 2020-12-16 – 2020-12-23 (×15): 25 mg via ORAL
  Filled 2020-12-16 (×15): qty 1

## 2020-12-16 MED ORDER — ACETAMINOPHEN 500 MG PO TABS
500.0000 mg | ORAL_TABLET | Freq: Four times a day (QID) | ORAL | Status: DC | PRN
  Administered 2020-12-16 – 2020-12-22 (×2): 500 mg via ORAL
  Filled 2020-12-16 (×2): qty 1

## 2020-12-16 NOTE — Progress Notes (Signed)
12/16/20 1316  PT Evaluation Information  Last PT Received On 12/16/20  Assistance Needed +2  PT/OT/SLP Co-Evaluation/Treatment Yes  Reason for Co-Treatment To address functional/ADL transfers;For patient/therapist safety  PT goals addressed during session Mobility/safety with mobility;Balance  History of Present Illness Pt presented to ED with c/o LE weakness, tingling, and burning as well as pain across his back.  MRI of brain showed small acute ischemic stroke at top of Lt motor strip.  Suspect bil. LE numbness and tingling is due to neuropathy, which is chronic, but has worsed due to immunotherapy for CA vs GBS.   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.; PTSD, peripheral neuropathy, diastolic CHF, s/p Rt TKA  Precautions  Precautions Fall  Precaution Comments Pt initially denied h/o falls, but than later states he slid off the couch a couple of weeks ago  Restrictions  Weight Bearing Restrictions No  Home Living  Family/patient expects to be discharged to: Private residence  Living Arrangements Spouse/significant other  Available Help at Discharge Family;Available 24 hours/day  Type of [place] One level  Bathroom Shower/Tub Tub/shower unit  Snyder height  Home Equipment Grab bars - tub/shower;Grab bars - toilet;Cane - single point;Walker - 2 wheels  Additional Comments Wife unable to physically assist  Prior Function  Level of Independence Independent with assistive device(s)  Comments Was using RW since having weakness.  Communication  Communication HOH  Pain Assessment  Pain Assessment Faces  Faces Pain Scale 6  Pain Location back pain  Pain Descriptors / Indicators Aching  Pain Intervention(s) Limited activity within patient's tolerance;Monitored during session;Repositioned  Cognition   Arousal/Alertness Awake/alert  Behavior During Therapy WFL for tasks assessed/performed  Overall Cognitive Status Within Functional Limits for tasks assessed  General Comments WFL for basic info, but would benefit from further assessment  Upper Extremity Assessment  Upper Extremity Assessment Defer to OT evaluation  Lower Extremity Assessment  Lower Extremity Assessment RLE deficits/detail  RLE Deficits / Details Grossly 3+/5 at the hip and knee, 4/5 at ankle  Cervical / Trunk Assessment  Cervical / Trunk Assessment Kyphotic  Bed Mobility  Overal bed mobility Needs Assistance  Bed Mobility Supine to Sit;Sit to Supine  Supine to sit Mod assist  Sit to supine Min assist  General bed mobility comments assist to lift trunk, and assist to lift Rt LE back into the bed  Transfers  Overall transfer level Needs assistance  Equipment used 2 person hand held assist  Transfers Sit to/from Stand;Stand Pivot Transfers  Sit to Stand Mod assist;+2 safety/equipment  Stand pivot transfers Mod assist;+2 physical assistance;+2 safety/equipment  General transfer comment Unsteady when taking steps requiring mod A for steadying.  Balance  Overall balance assessment Needs assistance  Sitting-balance support Feet supported  Sitting balance-Leahy Scale Fair  Standing balance support Bilateral upper extremity supported  Standing balance-Leahy Scale Poor  Standing balance comment requires UE support and up to mod A  General Comments  General comments (skin integrity, edema, etc.) HR 47-72 during activity. BP supine 109/50; 132/68 sitting; standing 114/72 with pt c/o dizziness  PT - End of Session  Equipment Utilized During Treatment Gait belt  Activity Tolerance Patient tolerated treatment well  Patient left in bed;with call bell/phone within reach (on stretcher in ED)  Nurse Communication Mobility status  PT Assessment  PT Recommendation/Assessment Patient needs continued  PT services  PT Visit  Diagnosis Unsteadiness on feet (R26.81);Muscle weakness (generalized) (M62.81)  PT Problem List Decreased strength;Decreased range of motion;Decreased activity tolerance;Decreased balance;Decreased mobility;Decreased coordination;Decreased knowledge of use of DME;Decreased safety awareness;Decreased knowledge of precautions;Pain  PT Plan  PT Frequency (ACUTE ONLY) Min 4X/week  PT Treatment/Interventions (ACUTE ONLY) Gait training;DME instruction;Functional mobility training;Stair training;Therapeutic activities;Therapeutic exercise;Balance training;Neuromuscular re-education;Patient/family education  AM-PAC PT "6 Clicks" Mobility Outcome Measure (Version 2)  Help needed turning from your back to your side while in a flat bed without using bedrails? 2  Help needed moving from lying on your back to sitting on the side of a flat bed without using bedrails? 2  Help needed moving to and from a bed to a chair (including a wheelchair)? 2  Help needed standing up from a chair using your arms (e.g., wheelchair or bedside chair)? 2  Help needed to walk in hospital room? 2  Help needed climbing 3-5 steps with a railing?  1  6 Click Score 11  Consider Recommendation of Discharge To: CIR/SNF/LTACH  PT Recommendation  Follow Up Recommendations CIR  PT equipment Wheelchair cushion (measurements PT);Wheelchair (measurements PT)  Individuals Consulted  Consulted and Agree with Results and Recommendations Patient  Acute Rehab PT Goals  Patient Stated Goal to be able to take care of self  PT Goal Formulation With patient  Time For Goal Achievement 12/30/20  Potential to Achieve Goals Good  PT Time Calculation  PT Start Time (ACUTE ONLY) 1057  PT Stop Time (ACUTE ONLY) 1121  PT Time Calculation (min) (ACUTE ONLY) 24 min  PT General Charges  $$ ACUTE PT VISIT 1 Visit  PT Evaluation  $PT Eval Moderate Complexity 1 Mod  Written Expression  Dominant Hand Right    Pt admitted secondary to problem above  with deficits below. Pt presenting with RLE weakness and back pain. Pt unsteady and requiring mod A +2 to take steps to and from stretcher. Feel pt would benefit from CIR level therapies to increase independence and safety. Will continue to follow acutely.   Farley Ly, PT, DPT  Acute Rehabilitation Services  Pager: [phone] Office: [phone]

## 2020-12-16 NOTE — ED Notes (Signed)
Lunch Tray Ordered @ 1038.  

## 2020-12-16 NOTE — H&P (Signed)
History and Physical    Evan Kirk [id number] DOB: 04/06/1941 DOA: 12/15/2020  PCP: Lawerance Cruel, MD  Patient coming from: Home  I have personally briefly reviewed patient's old medical records in [place]  Chief Complaint: Leg weakness  HPI: Evan Kirk is a 79 y.o. male with medical history significant of a.fib on eliquis, HTN, OSA, renal cancer with local recurrence, tumor surrounding IVC, pt started immunotherapy due to tumor recurrence.  Had first dose of immunotherapy with pembrolizumab / axitinib earlier this month.  Pt presents to ED with c/o leg weakness, tingling, and burning.  Symptoms ongoing for about 48h, symptoms constant, nothing makes better or worse.  Pt feels like RLE is weaker than LLE but burning and tingling are in BLE.  Has been having mid to low thoracic back pain across his back.  He has been able to ambulate with a walker with the BLE weakness.  No headache, no abd pain.   ED Course: extensive work up in ED including CT abd/pelvis, MRI brain, MRI w/wo of T and L spine: 1) small acute ischemic stroke at top of L motor strip explains the RLE weakness 2) suspect the BLE numbness, tingling, loss of reflexes, etc is due to neuropathy (which he did have chronically) but now worsened due to immunotherapy for cancer.   Review of Systems: As per HPI, otherwise all review of systems negative.  Past Medical History:  Diagnosis Date  . Anticoagulated on Coumadin    followed by [facility]  . Atrial fibrillation ([place])   . Back pain   . Bladder mass   . BPH (benign prostatic hypertrophy)   . Chronic atrial fibrillation ([place])   . Diastolic CHF, chronic [facility])    cardiologist-- dr Daneen Schick-- asymptomatic  . Diverticulosis of colon   . Edema   . Essential tremor 06-2015  . History of urinary retention   . HTN (hypertension)   . OSA on CPAP   . Peripheral neuropathy    BILATERAL FEET  . Prediabetes   . PTSD  (post-traumatic stress disorder)   . Renal cell carcinoma of left kidney [facility])     Past Surgical History:  Procedure Laterality Date  . CYSTOSCOPY WITH BIOPSY N/A 10/09/2015   Procedure: CYSTOSCOPY WITH BIOPSY;  Surgeon: Cleon Gustin, MD;  Location: [facility];  Service: Urology;  Laterality: N/A;  . NEPHRECTOMY Left   . NEPHRECTOMY RADICAL Left 08/13/2015   [facility]-  . TOTAL KNEE ARTHROPLASTY Right 11-10-2008  . TRANSTHORACIC ECHOCARDIOGRAM  08-12-2015      moderate concentric LVH, ef 50-55-%/  trivial MR , PR and TR/  severe LAE and RAE  . VASECTOMY REVERSAL  1978     reports that he quit smoking about 54 years ago. He has quit using smokeless tobacco. He reports that he does not drink alcohol and does not use drugs.  Allergies  Allergen Reactions  . Diltiazem Other (See Comments)    Pt didn't feel well     Family History  Problem Relation Age of Onset  . Stroke Mother      Prior to Admission medications   Medication Sig Start Date End Date Taking? Authorizing Provider  acetaminophen (TYLENOL) 500 MG tablet Take 500 mg by mouth every 6 (six) hours as needed for mild pain.   Yes [provider]  apixaban (ELIQUIS) 5 MG TABS tablet Take 5 mg by mouth 2 (two) times daily.   Yes [provider]  benzoyl peroxide 5 % gel Apply 1 application topically daily as needed (skin irritation).   Yes [provider]  carboxymethylcellulose (REFRESH PLUS) 0.5 % SOLN Place 1 drop into both eyes 3 (three) times daily as needed (dry eyes).   Yes [provider]  cholecalciferol (VITAMIN D) 1000 UNITS tablet Take 2,000 Units by mouth daily.   Yes [provider]  hydrocortisone 2.5 % lotion Apply topically 2 (two) times daily.   Yes [provider]  ketoconazole (NIZORAL) 2 % shampoo Apply 1 application topically 2 (two) times a week.   Yes [provider]  loratadine (CLARITIN) 10 MG tablet Take 10 mg by mouth  daily as needed for allergies.    Yes [provider]  metoprolol (LOPRESSOR) 50 MG tablet Take 0.5 tablets (25 mg total) by mouth 2 (two) times daily. 10/29/13  Yes Belva Crome, MD  metroNIDAZOLE (FLAGYL) 500 MG tablet Take 500 mg by mouth 3 (three) times daily. 06/04/20  Yes [provider]  metroNIDAZOLE (METROCREAM) 0.75 % cream Apply 1 application topically daily as needed. (roscea)   Yes [provider]  Multiple Vitamin (MULTIVITAMIN) capsule Take 1 capsule by mouth daily.   Yes [provider]  NON FORMULARY Peripheral Neuropathy Cream: Bupivacaine 1% Doxepin 3% Gabapentin 6% Pentoxifyline 3% Topiramate 1% Sig: Apply 1 to 2 gm to affected areas 3-4x daily prn  Refill 1   Yes [provider]  OVER THE COUNTER MEDICATION Take 2 capsules by mouth at bedtime. Med Name: INTESTINAL FORMULA #1   Yes [provider]  senna (SENOKOT) 8.6 MG tablet Take 1 tablet by mouth at bedtime.   Yes [provider]  traZODone (DESYREL) 100 MG tablet Take 100 mg by mouth at bedtime as needed for sleep.    Yes [provider]    Physical Exam: Vitals:   12/16/20 0145 12/16/20 0200 12/16/20 0215 12/16/20 0236  BP: 106/61 121/66 121/68   Pulse: 84 68 (!) 43   Resp: (!) 21 (!) 25 13   Temp:      TempSrc:      SpO2: 95% 94% 97%   Weight:    102.1 kg  Height:    5\' 6"  (1.676 m)    Constitutional: NAD, calm, comfortable Eyes: PERRL, lids and conjunctivae normal ENMT: Mucous membranes are moist. Posterior pharynx clear of any exudate or lesions.Normal dentition.  Neck: normal, supple, no masses, no thyromegaly Respiratory: clear to auscultation bilaterally, no wheezing, no crackles. Normal respiratory effort. No accessory muscle use.  Cardiovascular: Regular rate and rhythm, no murmurs / rubs / gallops. No extremity edema. 2+ pedal pulses. No carotid bruits.  Abdomen: no tenderness, no masses palpated. No hepatosplenomegaly. Bowel  sounds positive.  Musculoskeletal: no clubbing / cyanosis. No joint deformity upper and lower extremities. Good ROM, no contractures. Normal muscle tone.  Skin: no rashes, lesions, ulcers. No induration Neurologic: RLE 4/5 strength, LLE 5/5 strength, 5/5 BUE.  Diminished reflexes to BLE and BUE.  No clonus, negative babinski.  Does have decreased sensation to BLE in stocking distribution. Psychiatric: Normal judgment and insight. Alert and oriented x 3. Normal mood.    Labs on Admission: I have personally reviewed following labs and imaging studies  CBC: Recent Labs  Lab 12/15/20 1651  WBC 10.1  HGB 12.7*  HCT 40.6  MCV 92.1  PLT [id number]   Basic Metabolic Panel: Recent Labs  Lab 12/15/20 1651  NA 134*  K 4.4  CL 103  CO2 20*  GLUCOSE 100*  BUN 14  CREATININE 1.04  CALCIUM 10.2   GFR: Estimated Creatinine Clearance: 64.4 mL/min (by C-G formula based on SCr of 1.04 mg/dL). Liver Function Tests: No results for input(s): AST, ALT, ALKPHOS, BILITOT, PROT, ALBUMIN in the last 168 hours. No results for input(s): LIPASE, AMYLASE in the last 168 hours. No results for input(s): AMMONIA in the last 168 hours. Coagulation Profile: No results for input(s): INR, PROTIME in the last 168 hours. Cardiac Enzymes: No results for input(s): CKTOTAL, CKMB, CKMBINDEX, TROPONINI in the last 168 hours. BNP (last 3 results) No results for input(s): PROBNP in the last 8760 hours. HbA1C: No results for input(s): HGBA1C in the last 72 hours. CBG: No results for input(s): GLUCAP in the last 168 hours. Lipid Profile: No results for input(s): CHOL, HDL, LDLCALC, TRIG, CHOLHDL, LDLDIRECT in the last 72 hours. Thyroid Function Tests: No results for input(s): TSH, T4TOTAL, FREET4, T3FREE, THYROIDAB in the last 72 hours. Anemia Panel: No results for input(s): VITAMINB12, FOLATE, FERRITIN, TIBC, IRON, RETICCTPCT in the last 72 hours. Urine analysis:    Component Value Date/Time   COLORURINE YELLOW  12/15/2020 1825   APPEARANCEUR CLEAR 12/15/2020 1825   LABSPEC 1.013 12/15/2020 1825   PHURINE 6.0 12/15/2020 1825   GLUCOSEU NEGATIVE 12/15/2020 1825   HGBUR NEGATIVE 12/15/2020 1825   BILIRUBINUR NEGATIVE 12/15/2020 1825   KETONESUR NEGATIVE 12/15/2020 1825   PROTEINUR NEGATIVE 12/15/2020 1825   UROBILINOGEN 0.2 10/30/2008 1445   NITRITE NEGATIVE 12/15/2020 1825   LEUKOCYTESUR SMALL (A) 12/15/2020 1825    Radiological Exams on Admission: CT Head Wo Contrast  Result Date: 12/15/2020 CLINICAL DATA:  weakness EXAM: CT HEAD WITHOUT CONTRAST TECHNIQUE: Contiguous axial images were obtained from the base of the skull through the vertex without intravenous contrast. COMPARISON:  None. FINDINGS: Brain: No evidence of acute territorial infarction, hemorrhage, hydrocephalus,extra-axial collection or mass lesion/mass effect. There is dilatation the ventricles and sulci consistent with age-related atrophy. Low-attenuation changes in the deep white matter consistent with small vessel ischemia. Vascular: No hyperdense vessel or unexpected calcification. Skull: The skull is intact. No fracture or focal lesion identified. Sinuses/Orbits: The visualized paranasal sinuses and mastoid air cells are clear. The orbits and globes intact. Other: None IMPRESSION: No acute intracranial abnormality. Findings consistent with age related atrophy and chronic small vessel ischemia Electronically Signed   By: Jonna Clark M.D.   On: 12/15/2020 21:11   MR BRAIN WO CONTRAST  Result Date: 12/16/2020 CLINICAL DATA:  Lower extremity weakness.  Renal cell carcinoma. EXAM: MRI HEAD WITHOUT CONTRAST TECHNIQUE: Multiplanar, multiecho pulse sequences of the brain and surrounding structures were obtained without intravenous contrast. COMPARISON:  Brain MRI 07/06/2016 FINDINGS: Brain: There is a small focus of acute ischemia in the superior left hemisphere. Focus of chronic microhemorrhage in the left basal ganglia. No acute  hemorrhage. There is multifocal hyperintense T2-weighted signal within the white matter. Generalized volume loss without a clear lobar predilection. The midline structures are normal. Vascular: Major flow voids are preserved. Skull and upper cervical spine: Normal calvarium and skull base. Visualized upper cervical spine and soft tissues are normal. Unchanged occipital sebaceous cyst. Sinuses/Orbits:No paranasal sinus fluid levels or advanced mucosal thickening. No mastoid or middle ear effusion. Normal orbits. IMPRESSION: 1. Small focus of acute ischemia in the superior left hemisphere. No hemorrhage or mass effect. 2. Findings of chronic microvascular ischemia and generalized volume loss . 3. Assessment for metastatic disease limited without intravenous contrast. Electronically  Signed   By: Ulyses Jarred M.D.   On: 12/16/2020 01:01   MR THORACIC SPINE W WO CONTRAST  Result Date: 12/16/2020 CLINICAL DATA:  Renal cell carcinoma. Bilateral lower extremity weakness. EXAM: MRI THORACIC AND LUMBAR SPINE WITHOUT AND WITH CONTRAST TECHNIQUE: Multiplanar and multiecho pulse sequences of the thoracic and lumbar spine were obtained without and with intravenous contrast. CONTRAST:  6mL GADAVIST GADOBUTROL 1 MMOL/ML IV SOLN COMPARISON:  CT thoracic and lumbar spine 12/15/2020 FINDINGS: MRI THORACIC SPINE FINDINGS Alignment:  Physiologic. Vertebrae: There is signal change and mild contrast enhancement at the endplates of D34-534, X33443 and T10-11. No discrete masslike lesion. Cord:  Normal Paraspinal and other soft tissues: Limited assessment of the lungs. Disc levels: No disc herniation or spinal canal stenosis. MRI LUMBAR SPINE FINDINGS Segmentation:  Standard. Alignment: Grade 1 retrolisthesis at L3-4 and grade 1 anterolisthesis at L4-5. Vertebrae: Multilevel degenerative endplate signal changes. No abnormal contrast enhancement. Conus medullaris: Extends to the L1 level and appears normal. 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 Disc levels: L1-L2: Normal disc space and facet joints. No spinal canal stenosis. No neural foraminal stenosis. L2-L3: Normal disc space and facet joints. No spinal canal stenosis. No neural foraminal stenosis. L3-L4: Disc space narrowing with right asymmetric disc bulge. No spinal canal stenosis. Moderate right and mild left neural foraminal stenosis. L4-L5: Moderate facet hypertrophy with small disc bulge. No spinal canal stenosis. No neural foraminal stenosis. L5-S1: Normal disc space and facet joints. No spinal canal stenosis. No neural foraminal stenosis. Visualized sacrum: Normal. IMPRESSION: 1. 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 2. No evidence of metastatic disease to the thoracic or lumbar spine. 3. Mild edema and contrast enhancement at the endplates at D34-534, X33443 and T10-11 without discrete masslike lesion. This is favored to be degenerative. 4. Moderate right L3-4 neural foraminal stenosis. Electronically Signed   By: Ulyses Jarred M.D.   On: 12/16/2020 01:10   MR Lumbar Spine W Wo Contrast  Result Date: 12/16/2020 CLINICAL DATA:  Renal cell carcinoma. Bilateral lower extremity weakness. EXAM: MRI THORACIC AND LUMBAR SPINE WITHOUT AND WITH CONTRAST TECHNIQUE: Multiplanar and multiecho pulse sequences of the thoracic and lumbar spine were obtained without and with intravenous contrast. CONTRAST:  10mL GADAVIST GADOBUTROL 1 MMOL/ML IV SOLN COMPARISON:  CT thoracic and lumbar spine 12/15/2020 FINDINGS: MRI THORACIC SPINE FINDINGS Alignment:  Physiologic. Vertebrae: There is signal change and mild contrast enhancement at the endplates of D34-534, X33443 and T10-11. No discrete masslike lesion. Cord:  Normal Paraspinal and other soft tissues:  Limited assessment of the lungs. Disc levels: No disc herniation or spinal canal stenosis. MRI LUMBAR SPINE FINDINGS Segmentation:  Standard. Alignment: Grade 1 retrolisthesis at L3-4 and grade 1 anterolisthesis at L4-5. Vertebrae: Multilevel degenerative endplate signal changes. No abnormal contrast enhancement. Conus medullaris: Extends to the L1 level and appears normal. 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 Disc levels: L1-L2: Normal disc space and facet joints. No spinal canal stenosis. No neural foraminal stenosis. L2-L3: Normal disc space and facet joints. No spinal canal stenosis. No neural foraminal stenosis. L3-L4: Disc space narrowing with right asymmetric disc bulge. No spinal canal stenosis. Moderate right and  mild left neural foraminal stenosis. L4-L5: Moderate facet hypertrophy with small disc bulge. No spinal canal stenosis. No neural foraminal stenosis. L5-S1: Normal disc space and facet joints. No spinal canal stenosis. No neural foraminal stenosis. Visualized sacrum: Normal. IMPRESSION: 1. 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 2. No evidence of metastatic disease to the thoracic or lumbar spine. 3. Mild edema and contrast enhancement at the endplates at D34-534, X33443 and T10-11 without discrete masslike lesion. This is favored to be degenerative. 4. Moderate right L3-4 neural foraminal stenosis. Electronically Signed   By: Ulyses Jarred M.D.   On: 12/16/2020 01:10   CT CHEST ABDOMEN PELVIS W CONTRAST  Result Date: 12/15/2020 CLINICAL DATA:  Renal cell carcinoma abdominal mass. started Chemo 12/22 sent here by [facility] center for further evaluation EXAM: CT CHEST, ABDOMEN, AND PELVIS WITH CONTRAST TECHNIQUE: Multidetector CT imaging of the chest,  abdomen and pelvis was performed following the standard protocol during bolus administration of intravenous contrast. CONTRAST:  144mL OMNIPAQUE IOHEXOL 300 MG/ML  SOLN COMPARISON:  MRI abdomen 07/20/2015, CT abdomen pelvis 07/06/2015. FINDINGS: CT CHEST FINDINGS Cardiovascular: Left atrial enlargement. The remainder of the heart is normal in caliber. For. No significant pericardial effusion. The thoracic aorta is normal in caliber. At least moderate atherosclerotic plaque of the thoracic aorta. At least moderate three-vessel coronary artery calcifications. The main pulmonary artery is normal in caliber. No central pulmonary embolus. Mediastinum/Nodes: No enlarged mediastinal, hilar, or axillary lymph nodes. Thyroid gland, trachea, and esophagus demonstrate no significant findings. Lungs/Pleura: Bilateral lower lobe subsegmental atelectasis. Lingular atelectasis. No focal consolidation. There is a round solid 4 mm nodule within the left upper lobe. Micronodule within the right upper lobe (5:38). Micronodule within the right lower lobe (5:80). Subpleural micronodule within the right upper lobe (5:64). No pulmonary mass. No pleural effusion. No pneumothorax. Musculoskeletal: Slightly asymmetric gynecomastia, right greater than left. No suspicious lytic or blastic osseous lesions. No acute displaced fracture. Multilevel degenerative changes of the spine. Please see separately dictated CT thoracolumbar spine 12/15/2020. CT ABDOMEN PELVIS FINDINGS Hepatobiliary: No focal liver abnormality. At least 2.8 cm peripherally calcified gallstone within the gallbladder lumen. No associated gallbladder wall thickening or pericholecystic fluid. No biliary dilatation. Pancreas: No focal lesion. Normal pancreatic contour. No surrounding inflammatory changes. No main pancreatic ductal dilatation. Spleen: Normal in size without focal abnormality. Adrenals/Urinary Tract: No adrenal nodule bilaterally. Status post left nephrectomy. The  right kidney enhances homogeneous Lea. No nephrolithiasis. No hydronephrosis. No hydroureter. The urinary bladder is unremarkable. Status post Stomach/Bowel: Stomach is within normal limits. No evidence of small bowel wall thickening or dilatation. Diffuse sigmoid and descending colon diverticulosis this cysts with slightly increased trace pericolonic fat stranding but no definite bowel wall thickening. The appendix appears normal. Vascular/Lymphatic: No abdominal aorta or iliac aneurysm. Severe calcified and noncalcified atherosclerotic plaque of the aorta and its branches. Suggestion of prior retroperitoneal lymph node dissection. Heterogeneous lobulated 7.6 x 7.7 x 11.9 cm retroperitoneal mass that is noted to be inseparable from the inferior vena cava, third portion of the duodenum, aorta, and origin/proximal inferior mesenteric artery. No pelvic or inguinal lymphadenopathy. The mass is noted to encase the inferior vena cava. Intraluminal extension of the mass not excluded. No findings suggest extension of the mass into the right renal vein or right cardiac atrium. Reproductive: Prostate is unremarkable. Other: Trace simple free fluid within the abdomen pelvis. No  intraperitoneal free gas. No organized fluid collection. Musculoskeletal: No abdominal wall hernia or abnormality. No suspicious lytic or blastic osseous lesions. No acute displaced fracture. Multilevel degenerative changes of the spine. Please see separately dictated CT thoracolumbar spine 12/15/2020. IMPRESSION: 1. A 7.6 x 7.7 x 11.9 cm heterogeneous retroperitoneal mass is inseparable from the inferior vena cava, third portion of the duodenum, aorta, and origin/proximal inferior mesenteric artery. The mass is noted to encase the inferior vena cava with intraluminal extension not excluded. 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 2. Left atrial enlargement. 3. Diffuse descending  colon and sigmoid diverticulosis with no CT findings of definite acute diverticulitis. 4. Cholelithiasis with no CT findings of acute cholecystitis or choledocholithiasis. 5. Slightly asymmetric gynecomastia, right greater than left. Recommend mammographic evaluation. 6.  Aortic Atherosclerosis (ICD10-I70.0). 7. Please see separately dictated CT thoracolumbar spine 12/15/2020. Electronically Signed   By: Iven Finn M.D.   On: 12/15/2020 21:37   CT T-SPINE NO CHARGE  Result Date: 12/15/2020 CLINICAL DATA:  Bilateral lower extremity weakness. EXAM: CT Thoracic and Lumbar spine without contrast TECHNIQUE: Multiplanar CT images of the thoracic and lumbar spine were reconstructed from contemporary CT of the Chest, Abdomen, and Pelvis CONTRAST:  No additional contrast administered COMPARISON:  None FINDINGS: CT THORACIC SPINE FINDINGS Alignment: Mild dextroscoliosis. Vertebrae: No acute fracture or focal pathologic process. Paraspinal and other soft tissues: Please see report for concomitant CT of the chest, abdomen and pelvis. Disc levels: There is no spinal canal stenosis. CT LUMBAR SPINE FINDINGS Segmentation: Standard Alignment: Normal Vertebrae: No acute fracture or focal pathologic process. Paraspinal and other soft tissues: Retroperitoneal mass better characterized on concomitant CT of the chest, abdomen and pelvis. Disc levels: There is moderate L4-5 facet arthrosis. No lumbar spinal canal stenosis or neural impingement. IMPRESSION: 1. No acute abnormality of the thoracic or lumbar spine. 2. Retroperitoneal mass better characterized on concomitant CT of the chest, abdomen and pelvis. Electronically Signed   By: Ulyses Jarred M.D.   On: 12/15/2020 21:20   CT L-SPINE NO CHARGE  Result Date: 12/15/2020 CLINICAL DATA:  Bilateral lower extremity weakness. EXAM: CT Thoracic and Lumbar spine without contrast TECHNIQUE: Multiplanar CT images of the thoracic and lumbar spine were reconstructed from  contemporary CT of the Chest, Abdomen, and Pelvis CONTRAST:  No additional contrast administered COMPARISON:  None FINDINGS: CT THORACIC SPINE FINDINGS Alignment: Mild dextroscoliosis. Vertebrae: No acute fracture or focal pathologic process. Paraspinal and other soft tissues: Please see report for concomitant CT of the chest, abdomen and pelvis. Disc levels: There is no spinal canal stenosis. CT LUMBAR SPINE FINDINGS Segmentation: Standard Alignment: Normal Vertebrae: No acute fracture or focal pathologic process. Paraspinal and other soft tissues: Retroperitoneal mass better characterized on concomitant CT of the chest, abdomen and pelvis. Disc levels: There is moderate L4-5 facet arthrosis. No lumbar spinal canal stenosis or neural impingement. IMPRESSION: 1. No acute abnormality of the thoracic or lumbar spine. 2. Retroperitoneal mass better characterized on concomitant CT of the chest, abdomen and pelvis. Electronically Signed   By: Ulyses Jarred M.D.   On: 12/15/2020 21:20    EKG: Independently reviewed.  Assessment/Plan Principal Problem:   Acute ischemic stroke [facility]) Active Problems:   Atrial fibrillation (HCC)   Clear cell renal cell carcinoma (HCC)   Neuropathy    1. Acute ischemic stroke - Small stroke in L motor strip 1. Explaining the RLE weakness 2. Most likely breakthrough stroke from  A.Fib for which he takes chronic eliquis 3. Stroke pathway and work up 4. See neuro consult note 5. Tele monitor 6. 2d echo 7. Carotid dopplers 8. PT/OT/SLP 9. FLP / A1C 10. Cont anticoagulation at this time per neurologist (very small stroke) 2. 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 Acute on chronic neuropathy, suspect likely worsened as a known side of immunotherapies 2. However, with impaired reflexes as well: concern that pt may or may not also have GBS (another known side effect of the immunotherapies). 3. Spoke with neurology: 1. Hold eliquis, use heparin gtt instead 2. Will  likely need IR LP in ~48h after last dose eliquis to r/o GBS 3. Treat neuropathy with lyrica 3. Recurrent renal CA - 1. Unfortunately probably not be candidate to go back on immunotherapy as todays admission to hospital represents a grade 3 adverse reaction (at least, grade 4 if GBS confirmed). 2. F/u with oncology after admission. 3. Let patient know about the above 4. A.Fib - 1. Cont BB for rate control 2. Converting eliquis to heparin GTT as above  DVT prophylaxis: Heparin gtt Code Status: Full code Family Communication: No family in room Disposition Plan: TBD: home vs CIR after above work up [employer] called: Neurology Dr. Rory Percy Admission status: Admit to inpatient  Severity of Illness: The appropriate patient status for this patient is INPATIENT. Inpatient status is judged to be reasonable and necessary in order to provide the required intensity of service to ensure the patient's safety. The patient's presenting symptoms, physical exam findings, and initial radiographic and laboratory data in the context of their chronic comorbidities is felt to place them at high risk for further clinical deterioration. Furthermore, it is not anticipated that the patient will be medically stable for discharge from the hospital within 2 midnights of admission. The following factors support the patient status of inpatient.   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 DO [facility]  How to contact the [facility] Attending or Consulting provider  [place] or covering provider during after hours [place], for this patient?  1. Check the care team in [facility] and look for a) attending/consulting TRH provider listed and b) the [facility] team listed 2. Log into [email or web address]  Amion Physician Scheduling and messaging for groups and whole hospitals  On call and physician scheduling software for group practices, residents, hospitalists and other medical providers for call, clinic, rotation and shift schedules. OnCall Enterprise is a hospital-wide system for scheduling doctors and paging doctors on call. EasyPlot is for scientific plotting and data analysis.  [email or web address]  and use [place]'s universal password to access. If you do not have the password, please contact the hospital operator.  3. Locate the [facility] provider you are looking for under [facility] and page to a number that you can be directly reached. 4. If you still have difficulty reaching the provider, please page the [facility] (Director on Call) for the Hospitalists listed on amion for assistance.  12/16/2020, 2:40 AM

## 2020-12-16 NOTE — Progress Notes (Addendum)
Stroke work up results so far:  EKG: Atrial fibrillation with slow ventricular response Left axis deviation Low voltage QRS Inferior infarct , age undetermined Cannot rule out Anterior infarct , age undetermined Abnormal ECG  MRI brain:  1. Small focus of acute ischemia in the superior left hemisphere. No hemorrhage or mass effect. 2. Findings of chronic microvascular ischemia and generalized volume loss . 3. Assessment for metastatic disease limited without intravenous Contrast.  TTE has been ordered.  CTA of head and neck has been ordered, but will need to wait for 24 hours due to recent contrast load for prior scan in the setting of lone kidney.   Carotid ultrasound has been cancelled.    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 Will need to hold IV heparin prior to LP then restart afterwards. - Risks/benefit profile may be more in favor of electing for empiric IVIG treatment given rapid progression of his BLE symptoms with areflexia on initial Neurology exam.   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 Per July 2021 Oncology note, plan at that time was for follow up in 3 months towards the goal of pursuing additional treatment for the cancer recurrence - May benefit from transfer to [facility] due to complexity of his Oncology treatment and surveillance plans as documented in his [facility] Oncology follow up note from July  Above has been discussed with Dr. Sharon Seller  Electronically signed: Dr. Caryl Pina

## 2020-12-16 NOTE — Progress Notes (Addendum)
CPAP set up at bedside for patient. 2L bled in and CPAP on auto tritiate. Patient stated he would place himself on when he was ready. RT informed patient to call if he needed assistance.

## 2020-12-16 NOTE — Progress Notes (Signed)
  Echocardiogram 2D Echocardiogram has been performed.  Tye Savoy 12/16/2020, 9:45 AM

## 2020-12-16 NOTE — Progress Notes (Signed)
Occupational Therapy Evaluation Patient Details Name: Evan Kirk MRN: [id number] DOB: Mar 22, 1941 Today's Date: 12/16/2020    History of Present Illness Pt presented to ED with c/o LE weakness, tingling, and burning as well as pain across his back.  MRI of brain showed small acute ischemic stroke at top of Lt motor strip.  Suspect bil. LE numbness and tingling is due to neuropathy, which is chronic, but has worsed due to immunotherapy for CA vs GBS.   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.; PTSD, peripheral neuropathy, diastolic CHF, s/p Rt TKA   Clinical Impression   Pt admitted with above. He demonstrates the below listed deficits and will benefit from continued OT to maximize safety and independence with BADLs.  Pt presents to OT with generalized weakness, decreased balance, decreased activity tolerance, and pain.  He currently requires set up assist - mod A +2 for ADLs and mod A +2 for functional mobility.  He reports he lives with his spouse, who requires use of RW for mobility, and has h/o falls so can only provided limited assist at discharge.  He report she was fully independent PTA, and they also have 5 sons who can assist as needed.  Recommend CIR level rehab.       Follow Up Recommendations  CIR;Supervision/Assistance - 24 hour    Equipment Recommendations  3 in 1 bedside commode    Recommendations for Other Services Rehab consult     Precautions / Restrictions Precautions Precautions: Fall Precaution Comments: Pt initially denied h/o falls, but than later states he slid off the couch a couple of weeks ago Restrictions Weight Bearing Restrictions: No      Mobility Bed Mobility Overal bed mobility: Needs Assistance Bed Mobility: Supine to Sit;Sit to Supine     Supine to sit: Mod assist Sit to supine: Min assist   General bed mobility comments: assist  to lift trunk, and assist to lift Rt LE back into the bed    Transfers Overall transfer level: Needs assistance Equipment used: 2 person hand held assist Transfers: Sit to/from [employer] Transfers Sit to Stand: Mod assist;+2 safety/equipment Stand pivot transfers: Mod assist;+2 physical assistance;+2 safety/equipment       General transfer comment: assist for balance    Balance Overall balance assessment: Needs assistance Sitting-balance support: Feet supported Sitting balance-Leahy Scale: Fair     Standing balance support: Bilateral upper extremity supported Standing balance-Leahy Scale: Poor Standing balance comment: requires UE support and up to mod A                           ADL either performed or assessed with clinical judgement   ADL Overall ADL's : Needs assistance/impaired Eating/Feeding: Independent;Bed level   Grooming: Wash/dry hands;Wash/dry face;Oral care;Brushing hair;Moderate assistance;Standing   Upper Body Bathing: Minimal assistance;Sitting   Lower Body Bathing: Moderate assistance;Sit to/from stand   Upper Body Dressing : Minimal assistance;Sitting   Lower Body Dressing: Maximal assistance;Sit to/from stand   Toilet Transfer: Moderate assistance;Ambulation;Comfort height toilet;+2 for safety/equipment;+2 for physical assistance   Toileting- Clothing Manipulation and Hygiene: Moderate assistance;Sit to/from stand       Functional mobility during ADLs: Moderate assistance;+2 for physical assistance;+2 for safety/equipment       Vision Baseline Vision/History: Wears glasses Wears Glasses: At all times Patient Visual Report: No change from baseline Vision Assessment?: Yes Eye Alignment:  Within Functional Limits Ocular Range of Motion: Within Functional Limits Alignment/Gaze Preference: Within Defined Limits Tracking/Visual Pursuits: Able to track stimulus in all quads without difficulty Visual Fields: No apparent deficits      Perception Perception Perception Tested?: Yes   Praxis Praxis Praxis tested?: Within functional limits    Pertinent Vitals/Pain Pain Assessment: Faces Faces Pain Scale: Hurts even more Pain Location: back pain Pain Intervention(s): Monitored during session     Hand Dominance Right   Extremity/Trunk Assessment Upper Extremity Assessment Upper Extremity Assessment: Generalized weakness;LUE deficits/detail LUE Deficits / Details: Lt shoulder grossly 4-/5; elbow distally grossly 4/5   Lower Extremity Assessment Lower Extremity Assessment: Defer to PT evaluation   Cervical / Trunk Assessment Cervical / Trunk Assessment: Kyphotic   Communication Communication Communication: HOH   Cognition Arousal/Alertness: Awake/alert Behavior During Therapy: WFL for tasks assessed/performed Overall Cognitive Status: Within Functional Limits for tasks assessed                                 General Comments: WFL for basic info, but would benefit from further assessment   General Comments  HR 47-72 during activity. BP supine 109/50; 132/68 sitting; standing 114/72 with pt c/o dizziness    Exercises     Shoulder Instructions      Home Living Family/patient expects to be discharged to:: Private residence Living Arrangements: Spouse/significant other Available Help at Discharge: Family;Available 24 hours/day Type of Home: House Home Access: Ramped entrance     Home Layout: One level     Bathroom Shower/Tub: Chief Strategy Officer: Handicapped height     Home Equipment: Grab bars - tub/shower;Grab bars - toilet;Cane - single point;Walker - 2 wheels   Additional Comments: Wife unable to physically assist      Prior Functioning/Environment Level of Independence: Independent with assistive device(s)        Comments: Was using RW since having weakness.        OT Problem List: Decreased strength;Decreased range of motion;Decreased activity  tolerance;Impaired balance (sitting and/or standing);Decreased safety awareness;Decreased knowledge of use of DME or AE;Cardiopulmonary status limiting activity;Obesity;Pain      OT Treatment/Interventions: Self-care/ADL training;Therapeutic exercise;Neuromuscular education;DME and/or AE instruction;Therapeutic activities;Cognitive remediation/compensation;Visual/perceptual remediation/compensation;Patient/family education;Balance training    OT Goals(Current goals can be found in the care plan section) Acute Rehab OT Goals Patient Stated Goal: to be able to take care of self OT Goal Formulation: With patient/family Time For Goal Achievement: 12/30/20 Potential to Achieve Goals: Good ADL Goals Pt Will Perform Grooming: with min assist;standing Pt Will Perform Upper Body Bathing: with set-up;with supervision;sitting Pt Will Perform Lower Body Bathing: with min guard assist;with adaptive equipment;sit to/from stand Pt Will Perform Upper Body Dressing: with set-up;with supervision;sitting Pt Will Perform Lower Body Dressing: with min assist;with adaptive equipment;sit to/from stand Pt Will Transfer to Toilet: with min assist;ambulating;regular height toilet;bedside commode;grab bars Pt Will Perform Toileting - Clothing Manipulation and hygiene: with min assist;sit to/from stand  OT Frequency: Min 2X/week   Barriers to D/C:            Co-evaluation PT/OT/SLP Co-Evaluation/Treatment: Yes Reason for Co-Treatment: To address functional/ADL transfers;For patient/therapist safety   OT goals addressed during session: ADL's and self-care      AM-PAC OT "6 Clicks" Daily Activity     Outcome Measure Help from another person eating meals?: None Help from another person taking care of personal grooming?: A Lot Help from another  person toileting, which includes using toliet, bedpan, or urinal?: A Lot Help from another person bathing (including washing, rinsing, drying)?: A Lot Help from another  person to put on and taking off regular upper body clothing?: A Little Help from another person to put on and taking off regular lower body clothing?: A Lot 6 Click Score: 15   End of Session Equipment Utilized During Treatment: Gait belt  Activity Tolerance: Patient tolerated treatment well Patient left: in bed;with call bell/phone within reach  OT Visit Diagnosis: Unsteadiness on feet (R26.81);Pain Pain - Right/Left:  (back)                Time: [id number] OT Time Calculation (min): 24 min Charges:     Nilsa Nutting OTR/L Acute Rehabilitation Services Pager [phone] Office [phone]   Lucille Passy M 12/16/2020, 11:44 AM

## 2020-12-16 NOTE — ED Notes (Signed)
Dinner Tray Ordered @ 1714. 

## 2020-12-16 NOTE — Progress Notes (Signed)
Evan Kirk  [id number] DOB: 17-Jun-1941 DOA: 12/15/2020 PCP: Lawerance Cruel, MD    Brief Narrative:  79 year old [place] with a history of atrial fibrillation on Eliquis, HTN, OSA, and renal cell carcinoma with a local recurrence complicated by tumor surrounding the IVC.  He is recently started immunotherapy at [facility] using pembrolizumab/axitinib.  He presented to the ED 12/29 with leg weakness tingling and burning for 48 hours with the right leg greater than left leg weakness as well.  In the ED CT head was unremarkable. CT thoracic lumbar spine showed mass as described below. MRI brain shows a punctate acute infarct in the superior left frontal hemisphere, close to the motor strip. No bleeding. No mass-effect. MRI of the thoracic and lumbar spine with no evidence of spinal cord infarct.  Significant Events:  12/28 presented to [facility] ED 12/29 early a.m. admission -neurology consulted  Antimicrobials:  None  DVT prophylaxis: Eliquis > IV heparin  Subjective: Patient is seen for follow-up visit.  Extensive exam and work-up was accomplished by my partner earlier today.  His care was also discussed with neurology on-call.  The patient feels that his lower extremity strength and stability has improved slightly since his admission.  He denies any other complaints.  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 Candiss Norse)  Chronic atrial fibrillation on  Eliquis Transitioned to heparin drip for now to facilitate possible LP Friday   Sleep apnea Dependent upon CPAP nightly   Code Status: FULL CODE Family Communication: Spoke with son and patient at length at bedside Status is: Inpatient  Remains inpatient appropriate because:Inpatient level of care appropriate due to severity of illness   Dispo: The patient is from: Home              Anticipated d/c is to: unclear              Anticipated d/c date is: 3 days              Patient currently is not medically stable to d/c.   Consultants:  Neurology  Objective: Blood pressure 116/65, pulse 73, temperature 98.4 F (36.9 C), temperature source Oral, resp. rate (!) 26, height 5\' 6"  (1.676 m), weight 102.1 kg, SpO2 93 %. No intake or output data in the 24 hours ending 12/16/20 0824 Filed Weights   12/15/20 1612 12/16/20 0236  Weight: 102.1 kg 102.1 kg    Examination:   CBC: Recent Labs  Lab 12/15/20 1651  WBC 10.1  HGB 12.7*  HCT 40.6  MCV 92.1  PLT [id number]   Basic Metabolic Panel: Recent Labs  Lab 12/15/20 1651  NA 134*  K 4.4  CL 103  CO2 20*  GLUCOSE 100*  BUN 14  CREATININE 1.04  CALCIUM 10.2   GFR: Estimated Creatinine Clearance: 64.4 mL/min (by C-G formula based on SCr of 1.04 mg/dL).  Liver Function Tests: No results for input(s): AST, ALT, ALKPHOS, BILITOT, PROT, ALBUMIN in the last 168 hours. No results for input(s): LIPASE, AMYLASE in  the last 168 hours. No results for input(s): AMMONIA in the last 168 hours.  Coagulation Profile: No results for input(s): INR, PROTIME in the last 168 hours.  Cardiac Enzymes: No results for input(s): CKTOTAL, CKMB, CKMBINDEX, TROPONINI in the last 168 hours.  HbA1C: Hgb A1c MFr Bld  Date/Time Value Ref Range Status  12/16/2020 04:19 AM 5.5 4.8 - 5.6 % Final    Comment:    (NOTE) Pre diabetes:          5.7%-6.4%  Diabetes:              >6.4%  Glycemic control for   <7.0% adults with diabetes   07/10/2015  10:31 AM 6.1 4.6 - 6.5 % Final    Comment:    Glycemic Control Guidelines for People with Diabetes:Non Diabetic:  <6%Goal of Therapy: <7%Additional Action Suggested:  >8%     CBG: No results for input(s): GLUCAP in the last 168 hours.  Recent Results (from the past 240 hour(s))  Resp Panel by RT-PCR (Flu A&B, Covid) Nasopharyngeal Swab     Status: None   Collection Time: 12/15/20 10:30 PM   Specimen: Nasopharyngeal Swab; Nasopharyngeal(NP) swabs in vial transport medium  Result Value Ref Range Status   SARS Coronavirus 2 by RT PCR NEGATIVE NEGATIVE Final    Comment: (NOTE) SARS-CoV-2 target nucleic acids are NOT DETECTED.  The SARS-CoV-2 RNA is generally detectable in upper respiratory specimens during the acute phase of infection. The lowest concentration of SARS-CoV-2 viral copies this assay can detect is 138 copies/mL. A negative result does not preclude SARS-Cov-2 infection and should not be used as the sole basis for treatment or other patient management decisions. A negative result may occur with  improper specimen collection/handling, submission of specimen other than nasopharyngeal swab, presence of viral mutation(s) within the areas targeted by this assay, and inadequate number of viral copies(<138 copies/mL). A negative result must be combined with clinical observations, patient history, and epidemiological information. The expected result is Negative.  Fact Sheet for Patients:  EntrepreneurPulse.com.au  Fact Sheet for Healthcare Providers:  IncredibleEmployment.be  This test is no t yet approved or cleared by the [place] FDA and  has been authorized for detection and/or diagnosis of SARS-CoV-2 by FDA under an Emergency Use Authorization (EUA). This EUA will remain  in effect (meaning this test can be used) for the duration of the COVID-19 declaration under Section 564(b)(1) of the Act, 21 U.S.C.section 360bbb-3(b)(1), unless  the authorization is terminated  or revoked sooner.       Influenza A by PCR NEGATIVE NEGATIVE Final   Influenza B by PCR NEGATIVE NEGATIVE Final    Comment: (NOTE) The Xpert Xpress SARS-CoV-2/FLU/RSV plus assay is intended as an aid in the diagnosis of influenza from Nasopharyngeal swab specimens and should not be used as a sole basis for treatment. Nasal washings and aspirates are unacceptable for Xpert Xpress SARS-CoV-2/FLU/RSV testing.  Fact Sheet for Patients: EntrepreneurPulse.com.au  Fact Sheet for Healthcare Providers: IncredibleEmployment.be  This test is not yet approved or cleared by the [place] FDA and has been authorized for detection and/or diagnosis of SARS-CoV-2 by FDA under an Emergency Use Authorization (EUA). This EUA will remain in effect (meaning this test can be used) for the duration of the COVID-19 declaration under Section 564(b)(1) of the Act, 21 U.S.C. section 360bbb-3(b)(1), unless the authorization is terminated or revoked.  Performed at [facility], [place] [address] [place],  [address]  Scheduled Meds: . cholecalciferol  2,000 Units Oral Daily  . hydrocortisone   Topical BID  . [START ON 12/17/2020] ketoconazole  1 application Topical Once per day on Mon Thu  . metoprolol tartrate  25 mg Oral BID  . multivitamin with minerals  1 tablet Oral Daily  . pregabalin  25 mg Oral BID  . senna  1 tablet Oral QHS   Continuous Infusions: . heparin       LOS: 0 days   Lonia Blood, MD [facility] Office  [phone] Pager - Text Page per Amion  If 7PM-7AM, please contact night-coverage per Amion 12/16/2020, 8:24 AM

## 2020-12-16 NOTE — ED Notes (Signed)
RN messaged pharmacy for verification of 0230 med

## 2020-12-16 NOTE — Consult Note (Signed)
Neurology Consultation  Reason for Consult: Lower extremity pain and weakness bilaterally Referring Physician: Dr. Alcario Drought, [facility]  CC: Bilateral lower extremity pain and progressive weakness with right lower extremity weakness worse than the left which is more acute  History is obtained from: Patient, chart  HPI: Evan Kirk is a 79 y.o. male veteran of the [employer]  past medical history for A. 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  He reports that the symptoms were gradual in onset over the past few days-denies extremely sudden onset-and reports that the soles of his feet are burning much more in the past few days than they have at baseline.  Also complains of low thoracic back pain.  Uses a walker to ambulate but has been having significant difficulty supporting his weight over the past few days.  Reports that the right leg feels much more stiffer and started feeling much weaker for the last 2 days, which was a big change from the baseline.  Denies any bowel bladder involvement.  No abdominal pain.  No headaches.  No visual changes.  Symptoms have not been relieved since worsening over the last few days.  Has tried gabapentin at some point for his neuropathic pain but not had much success so discontinued it. Review of his medications reveals immunotherapy with axitinib/pembrolizumab, which he got the first dose earlier this month. Multiple providers involved in his care from [facility] as well as the [facility].  In the emergency room, he was evaluated by CT head, CT of thoracolumbar spine, MRI brain and MRI of the thoracolumbar  spine.  CT head was unremarkable.  CT thoracic lumbar spine showed mass as described below. MRI brain shows a punctate acute infarct in the superior left frontal hemisphere, close to the motor strip.  No bleeding.  No mass-effect. MRI of the thoracic and lumbar spine with no evidence of spinal cord infarct.  LKW: Unclear-subacute change and right-sided weakness noted more for the past 2 days. tpa given?: no, unclear last known well, likely outside the window Premorbid modified Rankin scale (mRS): 3-4  ROS: Performed and negative except noted in HPI.  Past Medical History:  Diagnosis Date   Anticoagulated on Coumadin    followed by [facility]   Atrial fibrillation ([place])    Back pain    Bladder mass    BPH (benign prostatic hypertrophy)    Chronic atrial fibrillation (HCC)    Diastolic CHF, chronic [facility])    cardiologist-- dr Daneen Schick-- asymptomatic   Diverticulosis of colon    Edema    Essential tremor 06-2015   History of urinary retention    HTN (hypertension)    OSA on CPAP    Peripheral neuropathy    BILATERAL FEET   Prediabetes    PTSD (post-traumatic stress disorder)    Renal cell carcinoma of left kidney (HCC)     Family History  Problem Relation Age of Onset   Stroke Mother    Social History:   reports that he quit smoking about 54 years ago. He has quit using smokeless tobacco. He reports that he does not drink alcohol and does not use drugs.  Medications  Current Facility-Administered Medications:  acetaminophen (TYLENOL) tablet 500 mg, 500 mg, Oral, Q6H PRN, Julian Reil, Jared M, DO   apixaban (ELIQUIS) tablet 5 mg, 5 mg, Oral, BID, Julian Reil, Jared M, DO   cholecalciferol (VITAMIN D3) tablet 2,000 Units, 2,000 Units, Oral, Daily, Julian Reil, Jared M, DO   hydrocortisone 2.5 % lotion, , Topical, BID, Julian Reil, Jared M, DO   [START ON 12/17/2020] ketoconazole (NIZORAL) 2 % shampoo 1 application, 1 application, Topical, Once per day on Mon Thu, Gardner, Jared M,  DO   loratadine (CLARITIN) tablet 10 mg, 10 mg, Oral, Daily PRN, Julian Reil, Jared M, DO   metoprolol tartrate (LOPRESSOR) tablet 25 mg, 25 mg, Oral, BID, Julian Reil, Jared M, DO   multivitamin with minerals tablet 1 tablet, 1 tablet, Oral, Daily, Julian Reil, Jared M, DO   polyvinyl alcohol (LIQUIFILM TEARS) 1.4 % ophthalmic solution 1 drop, 1 drop, Both Eyes, TID PRN, Julian Reil, Jared M, DO   pregabalin (LYRICA) capsule 25 mg, 25 mg, Oral, BID, Julian Reil, Jared M, DO   senna (SENOKOT) tablet 8.6 mg, 1 tablet, Oral, QHS, Julian Reil, Jared M, DO   traZODone (DESYREL) tablet 100 mg, 100 mg, Oral, QHS PRN, Hillary Bow, DO  Current Outpatient Medications:    acetaminophen (TYLENOL) 500 MG tablet, Take 500 mg by mouth every 6 (six) hours as needed for mild pain., Disp: , Rfl:    apixaban (ELIQUIS) 5 MG TABS tablet, Take 5 mg by mouth 2 (two) times daily., Disp: , Rfl:    benzoyl peroxide 5 % gel, Apply 1 application topically daily as needed (skin irritation)., Disp: , Rfl:    carboxymethylcellulose (REFRESH PLUS) 0.5 % SOLN, Place 1 drop into both eyes 3 (three) times daily as needed (dry eyes)., Disp: , Rfl:    cholecalciferol (VITAMIN D) 1000 UNITS tablet, Take 2,000 Units by mouth daily., Disp: , Rfl:    hydrocortisone 2.5 % lotion, Apply topically 2 (two) times daily., Disp: , Rfl:    ketoconazole (NIZORAL) 2 % shampoo, Apply 1 application topically 2 (two) times a week., Disp: , Rfl:    loratadine (CLARITIN) 10 MG tablet, Take 10 mg by mouth daily as needed for allergies. , Disp: , Rfl:    metoprolol (LOPRESSOR) 50 MG tablet, Take 0.5 tablets (25 mg total) by mouth 2 (two) times daily., Disp: 30 tablet, Rfl: 6   metroNIDAZOLE (FLAGYL) 500 MG tablet, Take 500 mg by mouth 3 (three) times daily., Disp: , Rfl:    metroNIDAZOLE (METROCREAM) 0.75 % cream, Apply 1 application topically daily as needed. (roscea), Disp: , Rfl:    Multiple Vitamin (MULTIVITAMIN) capsule, Take 1 capsule by mouth daily., Disp: , Rfl:     NON FORMULARY, Peripheral Neuropathy Cream: Bupivacaine 1% Doxepin 3% Gabapentin 6% Pentoxifyline 3% Topiramate 1% Sig: Apply 1 to 2 gm to affected areas 3-4x daily prn  Refill 1, Disp: , Rfl:    OVER THE COUNTER MEDICATION, Take 2 capsules by mouth at bedtime. Med Name: INTESTINAL FORMULA #1, Disp: , Rfl:    senna (SENOKOT) 8.6 MG tablet, Take 1 tablet by mouth at bedtime., Disp: , Rfl:    traZODone (DESYREL) 100 MG tablet, Take 100 mg by mouth at bedtime as needed for sleep. , Disp: , Rfl:   Exam: Current vital signs: BP 121/68   Pulse (!) 43   Temp 98.4 F (36.9 C) (Oral)   Resp 13   Ht 5\' 6"  (1.676 m)   Wt 102.1 kg   SpO2 97%   BMI 36.32 kg/m  Vital signs  in last 24 hours: Temp:  [98.1 F (36.7 C)-98.4 F (36.9 C)] 98.4 F (36.9 C) (12/28 1648) Pulse Rate:  [43-92] 43 (12/29 0215) Resp:  [12-25] 13 (12/29 0215) BP: (106-139)/(49-120) 121/68 (12/29 0215) SpO2:  [92 %-100 %] 97 % (12/29 0215) Weight:  [102.1 kg] 102.1 kg (12/28 1612) General: Awake alert in no distress HEENT: Normocephalic and atraumatic with dry oral mucous membranes Lungs: Clear to auscultation, no wheezing Cardiovascular: Irregularly irregular. Abdomen nondistended nontender Extremities: Warm well perfused with intact pulses Neurological exam Awake alert oriented x3 Mildly dysarthric No evidence of aphasia Cranial nerves II to XII intact. Motor examination: Right lower extremity 3/5 at the hip, 4/5 with plantar and dorsiflexors.  Left lower extremity 4+/5 all over.  Upper extremities bilaterally 4+ to 5/5 with no vertical drift. Sensory examination shows a very significant stocking pattern neuropathy in the lower extremity up to the mid calf and even up to the knee.  This is subtle diminished temperature and light touch in a glove pattern in both upper extremities up to the wrists as well.  No extinction. Coordination: No dysmetria in the upper extremities.  Difficult to perform the lower  extremities. Gait testing deferred at this time Deep tendon reflexes: Absent knee jerks and ankle jerks bilaterally.  Absent brachioradialis bilaterally.  Barely elicitable biceps jerks bilaterally.  Unable to elicit triceps jerks bilaterally.  NIH stroke scale 1a Level of Conscious.: 0 1b LOC Questions: 0 1c LOC Commands: 0 2 Best Gaze: 0 3 Visual: 0 4 Facial Palsy: 0 5a Motor Arm - left: 0 5b Motor Arm - Right: 0 6a Motor Leg - Left: 1 6b Motor Leg - Right: 2 7 Limb Ataxia: 0 8 Sensory: 0 9 Best Language: 0 10 Dysarthria: 1 11 Extinct. and Inatten.: 0 TOTAL: 4   Labs I have reviewed labs in epic and the results pertinent to this consultation are:  CBC    Component Value Date/Time   WBC 10.1 12/15/2020 1651   RBC 4.41 12/15/2020 1651   HGB 12.7 (L) 12/15/2020 1651   HCT 40.6 12/15/2020 1651   PLT 390 12/15/2020 1651   MCV 92.1 12/15/2020 1651   MCH 28.8 12/15/2020 1651   MCHC 31.3 12/15/2020 1651   RDW 13.5 12/15/2020 1651    CMP     Component Value Date/Time   NA 134 (L) 12/15/2020 1651   K 4.4 12/15/2020 1651   CL 103 12/15/2020 1651   CO2 20 (L) 12/15/2020 1651   GLUCOSE 100 (H) 12/15/2020 1651   BUN 14 12/15/2020 1651   CREATININE 1.04 12/15/2020 1651   CALCIUM 10.2 12/15/2020 1651   PROT 6.8 10/30/2008 1431   ALBUMIN 3.7 10/30/2008 1431   AST 25 10/30/2008 1431   ALT 25 10/30/2008 1431   ALKPHOS 67 10/30/2008 1431   BILITOT 1.0 10/30/2008 1431   GFRNONAA >60 12/15/2020 1651   GFRAA >60 07/20/2015 1715   Imaging I have reviewed the images obtained: CT-scan of the brain-no acute changes CT of the chest abdomen pelvis-with contrast, shows a 7.6 x 7.7 x 11.9 cm heterogeneous retroperitoneal mass that is inseparable from the inferior vena cava, third portion of the duodenum, aorta and origin/proximal inferior mesenteric artery.  The masses noted to encase the IVC with intraluminal extension not excluded.  Findings consistent with known and, likely  recurrent renal cell cancer in a patient status post left nephrectomy. Left atrial enlargement Diffuse descending colon and sigmoid diverticulosis with no CT findings of definitive acute diverticulitis.  Cholelithiasis with no CT findings of acute cholecystitis or choledocholithiasis Slightly asymmetric gynecomastia-right greater than left.  Recommend mammographic evaluation. CT of the thoracolumbar spine with no acute abnormality.  Retroperitoneal mass buttock characterized on concomitant CT of the chest abdomen pelvis. MRI examination of the brain-a small focus of acute ischemia in the superior left hemisphere with no hemorrhage or mass-effect.  Generalized atrophy.  Chronic microvascular disease. MRI of the thoracolumbar spine with and without contrast-redemonstration of large retroperitoneal mass that encases the infrarenal IVC with suspected occlusion of the IVC at the level of the right kidney.  No evidence of metastatic disease to the thoracic or lumbar spine.  Mild edema and contrast-enhancement at the endplates of D34-534, X33443 and T10-11 without discrete masslike lesion-favored to be degenerative.  Moderate right L3-4 neural foraminal stenosis.  Assessment: 79 year old man with past medical history of renal cell cancer status post resection-and left nephrectomy, A. fib on Eliquis, hypertension, OSA, possible local recurrence of the renal cell tumor now surrounding the IVC/aorta, currently undergoing immunotherapy, severe peripheral neuropathy presenting for a rather sudden onset of worsening bilateral leg burning and pain as well as right lower extremity weakness worse than left lower extremity weakness and difficulty walking even with a walker.  MRI of the brain shows a punctate left superior frontal infarct which can explain some of the right leg weakness but the remainder of his exam is suggestive of neuropathy, as well as complete absence of DTRs in both lower extremities and diminished DTRs in  the upper extremities.  His medications for immune therapy, have listed side effects of modest peripheral neuropathy but also for possible Guillain-Barr syndrome, which I think should be pursued because the treatment could be with IVIG if his spinal fluid shows albumin no cytologic dissociation.  As far as the stroke is concerned on the MRI, I think that that is secondary to embolic phenomenon from his atrial fibrillation.  Other possibilities could include paradoxical embolization from the IVC clot or a DVT in the leg.  Based on MRI of the thoracolumbar spine, and the absence of a spinal level, spinal cord infarct is not likely.  Impression: -Acute ischemic stroke-left frontal -could explain asymmetric right leg weakness due to the location in the left high frontal convexity. -Do not suspect spinal cord infarct -Evaluate for peripheral neuropathy versus Guillain-Barr syndrome -Renal cell cancer-likely recurrence with mass encasing the IVC and causing IVC occlusion  Recommendations:  For stroke work-up: -Frequent neurochecks -Telemetry -Anticoagulation-hold Eliquis, start heparin drip stroke protocol as the stroke is very small. -A1c, lipid panel, 2D echo -PT OT speech therapy -No need for permissive hypertension as the symptoms have been ongoing for a few days. -Lower extremity venous Dopplers.  For the neuropathy/neuropathic pain versus GBS: -I would recommend holding his apixaban, and starting him on a heparin drip in preparation for a spinal tap after his been off apixaban for at least 48 hours to look for albumino-cytological dissociation due to him being on medications that can cause Guillain-Barr syndrome.  Might need oncology input if there is Albumino cytological dissociation regarding appropriateness of IVIG given that he is already on immune therapy for his renal cell tumor. -Check B12, TSH, A1c for reversible causes of neuropathy. -Trial of Lyrica-has failed treatment with  gabapentin in the past. -Dr. Alcario Drought also raised the possibility for starting steroids which can be helpful in neurological side effects of the immunotherapy medications.  I would recommend holding off on that until spinal tap-in case he needs  IVIG.  From the renal cell mass perspective: -Management per primary team.  Will need multidisciplinary input-oncology, and possibly vascular surgery.  Defer to the primary team.  Plan discussed in detail with Dr. Alcario Drought, in person as well as via secure chat.   Will follow.  -- Amie Portland, MD [facility] Pager: [phone] If 7pm to 7am, please call on call as listed on AMION.

## 2020-12-16 NOTE — Progress Notes (Signed)
Rehab Admissions Coordinator Note:  Per PT and OT recommendation, this patient was screened by Cheri Rous for appropriateness for an Inpatient Acute Rehab Consult.  At this time, we are recommending Inpatient Rehab consult. AC will place consult order in the chart per protocol.   Cheri Rous 12/16/2020, 5:16 PM  I can be reached at [phone].

## 2020-12-16 NOTE — Progress Notes (Signed)
ANTICOAGULATION CONSULT NOTE - Initial Consult  Pharmacy Consult for heparin Indication: atrial fibrillation  Allergies  Allergen Reactions  . Diltiazem Other (See Comments)    Pt didn't feel well     Patient Measurements: Height: 5\' 6"  (167.6 cm) Weight: 102.1 kg (225 lb) IBW/kg (Calculated) : 63.8 Heparin Dosing Weight: 86.4 kg  Vital Signs: Temp: 98.4 F (36.9 C) (12/28 1648) Temp Source: Oral (12/28 1648) BP: 121/68 (12/29 0215) Pulse Rate: 43 (12/29 0215)  Labs: Recent Labs    12/15/20 1651  HGB 12.7*  HCT 40.6  PLT 390  CREATININE 1.04    Estimated Creatinine Clearance: 64.4 mL/min (by C-G formula based on SCr of 1.04 mg/dL).   Medical History: Past Medical History:  Diagnosis Date  . Anticoagulated on Coumadin    followed by [facility]  . Atrial fibrillation (HCC)   . Back pain   . Bladder mass   . BPH (benign prostatic hypertrophy)   . Chronic atrial fibrillation (HCC)   . Diastolic CHF, chronic [facility])    cardiologist-- dr [facility]-- asymptomatic  . Diverticulosis of colon   . Edema   . Essential tremor 06-2015  . History of urinary retention   . HTN (hypertension)   . OSA on CPAP   . Peripheral neuropathy    BILATERAL FEET  . Prediabetes   . PTSD (post-traumatic stress disorder)   . Renal cell carcinoma of left kidney (HCC)     Medications:  See med history  Assessment: 79 yo man to hold eliquis and start heparin for afib.  Last dose eliquis earlier today.  Hg 12.7, PTLC 390 Goal of Therapy:  Heparin level 0.3-0.7 units/ml aPTT 66-102 seconds Monitor platelets by anticoagulation protocol: Yes   Plan:  Start heparin ~ 12 hours after last dose eliquis at 1350 units/hr Check aPTT and heparin level 6-8 hours after start  Jasminne Mealy Poteet 12/16/2020,3:00 AM

## 2020-12-16 NOTE — ED Notes (Signed)
Pt in MRI. Cannot validate vital signs

## 2020-12-17 DIAGNOSIS — I482 Chronic atrial fibrillation, unspecified: Secondary | ICD-10-CM

## 2020-12-17 DIAGNOSIS — I639 Cerebral infarction, unspecified: Secondary | ICD-10-CM | POA: Diagnosis not present

## 2020-12-17 LAB — MAGNESIUM: Magnesium: 2 mg/dL (ref 1.7–2.4)

## 2020-12-17 LAB — CBC
HCT: 38.1 % — ABNORMAL LOW (ref 39.0–52.0)
Hemoglobin: 12.5 g/dL — ABNORMAL LOW (ref 13.0–17.0)
MCH: 29.5 pg (ref 26.0–34.0)
MCHC: 32.8 g/dL (ref 30.0–36.0)
MCV: 89.9 fL (ref 80.0–100.0)
Platelets: 395 10*3/uL (ref 150–400)
RBC: 4.24 MIL/uL (ref 4.22–5.81)
RDW: 13.8 % (ref 11.5–15.5)
WBC: 8.1 10*3/uL (ref 4.0–10.5)
nRBC: 0 % (ref 0.0–0.2)

## 2020-12-17 LAB — APTT
aPTT: 85 seconds — ABNORMAL HIGH (ref 24–36)
aPTT: 75 seconds — ABNORMAL HIGH (ref 24–36)

## 2020-12-17 LAB — COMPREHENSIVE METABOLIC PANEL
ALT: 10 U/L (ref 0–44)
AST: 12 U/L — ABNORMAL LOW (ref 15–41)
Albumin: 3 g/dL — ABNORMAL LOW (ref 3.5–5.0)
Alkaline Phosphatase: 54 U/L (ref 38–126)
Anion gap: 8 (ref 5–15)
BUN: 16 mg/dL (ref 8–23)
CO2: 23 mmol/L (ref 22–32)
Calcium: 10 mg/dL (ref 8.9–10.3)
Chloride: 103 mmol/L (ref 98–111)
Creatinine, Ser: 1.21 mg/dL (ref 0.61–1.24)
GFR, Estimated: >60 mL/min (ref >60)
Glucose, Bld: 113 mg/dL — ABNORMAL HIGH (ref 70–99)
Potassium: 4.1 mmol/L (ref 3.5–5.1)
Sodium: 134 mmol/L — ABNORMAL LOW (ref 135–145)
Total Bilirubin: 0.5 mg/dL (ref 0.3–1.2)
Total Protein: 7.1 g/dL (ref 6.5–8.1)

## 2020-12-17 LAB — HEPARIN LEVEL (UNFRACTIONATED)
Heparin Unfractionated: 0.79 IU/mL — ABNORMAL HIGH (ref 0.30–0.70)
Heparin Unfractionated: 0.62 IU/mL (ref 0.30–0.70)

## 2020-12-17 MED ORDER — VITAMIN B-12 1000 MCG PO TABS
2000.0000 ug | ORAL_TABLET | Freq: Every day | ORAL | Status: DC
  Administered 2020-12-17 – 2020-12-23 (×7): 2000 ug via ORAL
  Filled 2020-12-17 (×7): qty 2

## 2020-12-17 NOTE — Consult Note (Signed)
Physical Medicine and Rehabilitation Consult Reason for Consult: Lower extremity weakness bilaterally Referring Physician: [facility]   HPI: Evan Kirk is a 79 y.o. right-handed male with history of atrial fibrillation maintained on Eliquis, hypertension, OSA, renal cancer with left nephrectomy 2016 with local recurrence and tumor surrounding IVC/aorta undergoing immunotherapy receiving chemo via oncologist at [facility], [place], diastolic congestive heart failure, essential tremor, prediabetes.  Per chart review patient lives with spouse.  1 level home ramped entrance.  Independent with assistive device.  Wife unable to physically assist.  Presented 12/15/2020 with bilateral lower extremity weakness right greater than left of acute onset.  CT/MRI showed small focus of acute ischemia in the superior left hemisphere.  No hemorrhage or mass-effect.  CT angiogram of head and neck no emergent large vessel occlusion or stenosis.  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  No evidence of metastatic disease to the thoracic or lumbar spine.  Mild edema and contrast-enhancement at the endplates of D34-534 [id number] [id number] without discrete masslike lesion.  Echocardiogram with ejection fraction of 60 to 65% no wall motion abnormalities.  Admission chemistries were unremarkable except sodium 134 glucose 100, hemoglobin 12.7.  Neurology follow-up currently maintained on intravenous heparin and await transition to Eliquis.  Awaiting plan for possible need for LP.  Physical Medicine & Rehabilitation was consulted to assess candidacy for CIR given lower extremity weakness. Currently with leg swelling and constipation.    Review of Systems  Constitutional: Negative for chills and fever.  HENT: Negative for hearing loss.   Eyes: Negative for blurred vision and double  vision.  Respiratory: Negative for cough and shortness of breath.   Cardiovascular: Positive for leg swelling. Negative for chest pain and palpitations.  Gastrointestinal: Positive for constipation. Negative for heartburn, nausea and vomiting.  Genitourinary: Positive for urgency. Negative for dysuria, flank pain and hematuria.  Musculoskeletal: Positive for back pain.  Skin: Negative for rash.  Neurological: Positive for tremors and weakness.  Psychiatric/Behavioral:       PTSD  All other systems reviewed and are negative.  Past Medical History:  Diagnosis Date  . Anticoagulated on Coumadin    followed by [place] [facility]  . Atrial fibrillation ([place])   . Back pain   . Bladder mass   . BPH (benign prostatic hypertrophy)   . Chronic atrial fibrillation ([place])   . Diastolic CHF, chronic [facility])    cardiologist-- dr Daneen Schick-- asymptomatic  . Diverticulosis of colon   . Edema   . Essential tremor 06-2015  . History of urinary retention   . HTN (hypertension)   . OSA on CPAP   . Peripheral neuropathy    BILATERAL FEET  . Prediabetes   . PTSD (post-traumatic stress disorder)   . Renal cell carcinoma of left kidney [facility])    Past Surgical History:  Procedure Laterality Date  . CYSTOSCOPY WITH BIOPSY N/A 10/09/2015   Procedure: CYSTOSCOPY WITH BIOPSY;  Surgeon: Cleon Gustin, MD;  Location[facility];  Service: Urology;  Laterality: N/A;  . NEPHRECTOMY Left   . NEPHRECTOMY RADICAL Left 08/13/2015   [facility]  . TOTAL KNEE ARTHROPLASTY Right 11-10-2008  . TRANSTHORACIC ECHOCARDIOGRAM  08-12-2015      moderate concentric LVH, ef 50-55-%/  trivial MR , PR and TR/  severe LAE and RAE  . VASECTOMY  REVERSAL  1978   Family History  Problem Relation Age of Onset  . Stroke Mother    Social History:  reports that he quit smoking about 54 years ago. He has quit using smokeless tobacco. He reports that he does not drink alcohol and does not use drugs. Allergies:   Allergies  Allergen Reactions  . Pembrolizumab Other (See Comments)    1) severe neuropathy 2) may or may not be causing GBS as well, this still TBD, see 12/16/2020 admission for further details  . Diltiazem Other (See Comments)    Pt didn't feel well    Medications Prior to Admission  Medication Sig Dispense Refill  . acetaminophen (TYLENOL) 500 MG tablet Take 500 mg by mouth every 6 (six) hours as needed for mild pain.    Marland Kitchen apixaban (ELIQUIS) 5 MG TABS tablet Take 5 mg by mouth 2 (two) times daily.    . benzoyl peroxide 5 % gel Apply 1 application topically daily as needed (skin irritation).    . carboxymethylcellulose (REFRESH PLUS) 0.5 % SOLN Place 1 drop into both eyes 3 (three) times daily as needed (dry eyes).    . cholecalciferol (VITAMIN D) 1000 UNITS tablet Take 2,000 Units by mouth daily.    . hydrocortisone 2.5 % lotion Apply topically 2 (two) times daily.    Marland Kitchen ketoconazole (NIZORAL) 2 % shampoo Apply 1 application topically 2 (two) times a week.    . loratadine (CLARITIN) 10 MG tablet Take 10 mg by mouth daily as needed for allergies.     . metoprolol (LOPRESSOR) 50 MG tablet Take 0.5 tablets (25 mg total) by mouth 2 (two) times daily. 30 tablet 6  . metroNIDAZOLE (FLAGYL) 500 MG tablet Take 500 mg by mouth 3 (three) times daily.    . metroNIDAZOLE (METROCREAM) 0.75 % cream Apply 1 application topically daily as needed. (roscea)    . Multiple Vitamin (MULTIVITAMIN) capsule Take 1 capsule by mouth daily.    . NON FORMULARY Peripheral Neuropathy Cream: Bupivacaine 1% Doxepin 3% Gabapentin 6% Pentoxifyline 3% Topiramate 1% Sig: Apply 1 to 2 gm to affected areas 3-4x daily prn  Refill 1    . OVER THE COUNTER MEDICATION Take 2 capsules by mouth at bedtime. Med Name: INTESTINAL FORMULA #1    . senna (SENOKOT) 8.6 MG tablet Take 1 tablet by mouth at bedtime.    . traZODone (DESYREL) 100 MG tablet Take 100 mg by mouth at bedtime as needed for sleep.       Home: Home  Living Family/patient expects to be discharged to:: Private residence Living Arrangements: Spouse/significant other Available Help at Discharge: Family,Available 24 hours/day Type of Home: House Home Access: [place]: One level Bathroom Shower/Tub: Chiropodist: Handicapped height [place]: Grab bars - tub/shower,Grab bars - toilet,Cane - single point,Walker - 2 wheels Additional Comments: Wife unable to physically assist  Functional History: Prior Function Level of Independence: Independent with assistive device(s) Comments: Was using RW since having weakness. Functional Status:  Mobility: Bed Mobility Overal bed mobility: Needs Assistance Bed Mobility: Supine to Sit,Sit to Supine Supine to sit: Mod assist Sit to supine: Min assist General bed mobility comments: assist to lift trunk, and assist to lift Rt LE back into the bed Transfers Overall transfer level: Needs assistance Equipment used: 2 person hand held assist Transfers: Sit to/from [employer] Sit to Stand: Mod assist,+2 safety/equipment Stand pivot transfers: Mod assist,+2 physical assistance,+2 safety/equipment General transfer comment: Unsteady when taking  steps requiring mod A for steadying.      ADL: ADL Overall ADL's : Needs assistance/impaired Eating/Feeding: Independent,Bed level Grooming: Wash/dry hands,Wash/dry face,Oral care,Brushing hair,Moderate assistance,Standing Upper Body Bathing: Minimal assistance,Sitting Lower Body Bathing: Moderate assistance,Sit to/from stand Upper Body Dressing : Minimal assistance,Sitting Lower Body Dressing: Maximal assistance,Sit to/from stand Toilet Transfer: Moderate assistance,Ambulation,Comfort height toilet,+2 for safety/equipment,+2 for physical assistance Toileting- Clothing Manipulation and Hygiene: Moderate assistance,Sit to/from stand Functional mobility during ADLs: Moderate assistance,+2 for physical  assistance,+2 for safety/equipment  Cognition: Cognition Overall Cognitive Status: Within Functional Limits for tasks assessed Orientation Level: Oriented X4 Cognition Arousal/Alertness: Awake/alert Behavior During Therapy: WFL for tasks assessed/performed Overall Cognitive Status: Within Functional Limits for tasks assessed General Comments: WFL for basic info, but would benefit from further assessment  Blood pressure (!) 139/57, pulse 68, temperature 98.2 F (36.8 C), temperature source Oral, resp. rate 13, height 5\' 6"  (1.676 m), weight 102.1 kg, SpO2 96 %. Physical Exam General: Alert, No apparent distress HEENT: Head is normocephalic, atraumatic, PERRLA, EOMI, sclera anicteric, oral mucosa pink and moist, dentition intact, ext ear canals clear,  Neck: Supple without JVD or lymphadenopathy Heart: Reg rate and rhythm. No murmurs rubs or gallops Chest: CTA bilaterally without wheezes, rales, or rhonchi; no distress Abdomen: Soft, non-tender, non-distended, bowel sounds positive. Extremities: No clubbing, cyanosis, or edema. Pulses are 2+ Skin: Clean and intact without signs of breakdown Neuro: Patient is alert in no acute distress oriented x3 and follows commands. RLE 4/5, LLE 5/5 proximally and 4/5 distally. Sensation is intact Psych: Pt's affect is appropriate. Pt is cooperative  Results for orders placed or performed during the hospital encounter of 12/15/20 (from the past 24 hour(s))  Vitamin B12     Status: Abnormal   Collection Time: 12/16/20 10:43 AM  Result Value Ref Range   Vitamin B-12 1,048 (H) 180 - 914 pg/mL  Folate     Status: None   Collection Time: 12/16/20 10:43 AM  Result Value Ref Range   Folate 12.5 >5.9 ng/mL  TSH     Status: None   Collection Time: 12/16/20 10:43 AM  Result Value Ref Range   TSH 1.535 0.350 - 4.500 uIU/mL  Comprehensive metabolic panel     Status: Abnormal   Collection Time: 12/17/20  3:14 AM  Result Value Ref Range   Sodium 134 (L)  135 - 145 mmol/L   Potassium 4.1 3.5 - 5.1 mmol/L   Chloride 103 98 - 111 mmol/L   CO2 23 22 - 32 mmol/L   Glucose, Bld 113 (H) 70 - 99 mg/dL   BUN 16 8 - 23 mg/dL   Creatinine, Ser 4.69 0.61 - 1.24 mg/dL   Calcium 62.9 8.9 - 52.8 mg/dL   Total Protein 7.1 6.5 - 8.1 g/dL   Albumin 3.0 (L) 3.5 - 5.0 g/dL   AST 12 (L) 15 - 41 U/L   ALT 10 0 - 44 U/L   Alkaline Phosphatase 54 38 - 126 U/L   Total Bilirubin 0.5 0.3 - 1.2 mg/dL   GFR, Estimated >41 >32 mL/min   Anion gap 8 5 - 15  CBC     Status: Abnormal   Collection Time: 12/17/20  3:14 AM  Result Value Ref Range   WBC 8.1 4.0 - 10.5 K/uL   RBC 4.24 4.22 - 5.81 MIL/uL   Hemoglobin 12.5 (L) 13.0 - 17.0 g/dL   HCT 44.0 (L) 10.2 - 72.5 %   MCV 89.9 80.0 - 100.0 fL   MCH  29.5 26.0 - 34.0 pg   MCHC 32.8 30.0 - 36.0 g/dL   RDW 13.8 11.5 - 15.5 %   Platelets 395 150 - 400 K/uL   nRBC 0.0 0.0 - 0.2 %  Magnesium     Status: None   Collection Time: 12/17/20  3:14 AM  Result Value Ref Range   Magnesium 2.0 1.7 - 2.4 mg/dL  Heparin level (unfractionated)     Status: Abnormal   Collection Time: 12/17/20  3:47 AM  Result Value Ref Range   Heparin Unfractionated 0.79 (H) 0.30 - 0.70 IU/mL  APTT     Status: Abnormal   Collection Time: 12/17/20  3:47 AM  Result Value Ref Range   aPTT 85 (H) 24 - 36 seconds   CT ANGIO HEAD W OR WO CONTRAST  Result Date: 12/16/2020 CLINICAL DATA:  Weakness EXAM: CT ANGIOGRAPHY HEAD AND NECK TECHNIQUE: Multidetector CT imaging of the head and neck was performed using the standard protocol during bolus administration of intravenous contrast. Multiplanar CT image reconstructions and MIPs were obtained to evaluate the vascular anatomy. Carotid stenosis measurements (when applicable) are obtained utilizing NASCET criteria, using the distal internal carotid diameter as the denominator. CONTRAST:  35mL OMNIPAQUE IOHEXOL 350 MG/ML SOLN COMPARISON:  Head CT 12/15/2020 FINDINGS: CT HEAD FINDINGS Brain: There is no mass,  hemorrhage or extra-axial collection. There is generalized atrophy without lobar predilection. There is no acute or chronic infarction. The brain parenchyma is normal. Skull: The visualized skull base, calvarium and extracranial soft tissues are normal. Sinuses/Orbits: No fluid levels or advanced mucosal thickening of the visualized paranasal sinuses. No mastoid or middle ear effusion. The orbits are normal. CTA NECK FINDINGS SKELETON: There is no bony spinal canal stenosis. No lytic or blastic lesion. OTHER NECK: Normal pharynx, larynx and major salivary glands. No cervical lymphadenopathy. Unremarkable thyroid gland. UPPER CHEST: No pneumothorax or pleural effusion. No nodules or masses. AORTIC ARCH: There is calcific atherosclerosis of the aortic arch. There is no aneurysm, dissection or hemodynamically significant stenosis of the visualized portion of the aorta. Conventional 3 vessel aortic branching pattern. The visualized proximal subclavian arteries are widely patent. RIGHT CAROTID SYSTEM: No dissection, occlusion or aneurysm. Mild atherosclerotic calcification at the carotid bifurcation without hemodynamically significant stenosis. LEFT CAROTID SYSTEM: No dissection, occlusion or aneurysm. Mild atherosclerotic calcification at the carotid bifurcation without hemodynamically significant stenosis. VERTEBRAL ARTERIES: Left dominant configuration. Both origins are clearly patent. There is no dissection, occlusion or flow-limiting stenosis to the skull base (V1-V3 segments). CTA HEAD FINDINGS POSTERIOR CIRCULATION: --Vertebral arteries: Normal V4 segments. --Inferior cerebellar arteries: Normal. --Basilar artery: Normal. --Superior cerebellar arteries: Normal. --Posterior cerebral arteries (PCA): Normal. ANTERIOR CIRCULATION: --Intracranial internal carotid arteries: Normal. --Anterior cerebral arteries (ACA): Normal. Both A1 segments are present. Patent anterior communicating artery (a-comm). --Middle cerebral  arteries (MCA): Normal. VENOUS SINUSES: As permitted by contrast timing, patent. ANATOMIC VARIANTS: None Review of the MIP images confirms the above findings. IMPRESSION: 1. No emergent large vessel occlusion or high-grade stenosis of the head or neck. Aortic Atherosclerosis (ICD10-I70.0). Electronically Signed   By: Ulyses Jarred M.D.   On: 12/16/2020 21:21   CT Head Wo Contrast  Result Date: 12/15/2020 CLINICAL DATA:  weakness EXAM: CT HEAD WITHOUT CONTRAST TECHNIQUE: Contiguous axial images were obtained from the base of the skull through the vertex without intravenous contrast. COMPARISON:  None. FINDINGS: Brain: No evidence of acute territorial infarction, hemorrhage, hydrocephalus,extra-axial collection or mass lesion/mass effect. There is dilatation the ventricles and sulci consistent with  age-related atrophy. Low-attenuation changes in the deep white matter consistent with small vessel ischemia. Vascular: No hyperdense vessel or unexpected calcification. Skull: The skull is intact. No fracture or focal lesion identified. Sinuses/Orbits: The visualized paranasal sinuses and mastoid air cells are clear. The orbits and globes intact. Other: None IMPRESSION: No acute intracranial abnormality. Findings consistent with age related atrophy and chronic small vessel ischemia Electronically Signed   By: Prudencio Pair M.D.   On: 12/15/2020 21:11   CT ANGIO NECK W OR WO CONTRAST  Result Date: 12/16/2020 CLINICAL DATA:  Weakness EXAM: CT ANGIOGRAPHY HEAD AND NECK TECHNIQUE: Multidetector CT imaging of the head and neck was performed using the standard protocol during bolus administration of intravenous contrast. Multiplanar CT image reconstructions and MIPs were obtained to evaluate the vascular anatomy. Carotid stenosis measurements (when applicable) are obtained utilizing NASCET criteria, using the distal internal carotid diameter as the denominator. CONTRAST:  33mL OMNIPAQUE IOHEXOL 350 MG/ML SOLN COMPARISON:   Head CT 12/15/2020 FINDINGS: CT HEAD FINDINGS Brain: There is no mass, hemorrhage or extra-axial collection. There is generalized atrophy without lobar predilection. There is no acute or chronic infarction. The brain parenchyma is normal. Skull: The visualized skull base, calvarium and extracranial soft tissues are normal. Sinuses/Orbits: No fluid levels or advanced mucosal thickening of the visualized paranasal sinuses. No mastoid or middle ear effusion. The orbits are normal. CTA NECK FINDINGS SKELETON: There is no bony spinal canal stenosis. No lytic or blastic lesion. OTHER NECK: Normal pharynx, larynx and major salivary glands. No cervical lymphadenopathy. Unremarkable thyroid gland. UPPER CHEST: No pneumothorax or pleural effusion. No nodules or masses. AORTIC ARCH: There is calcific atherosclerosis of the aortic arch. There is no aneurysm, dissection or hemodynamically significant stenosis of the visualized portion of the aorta. Conventional 3 vessel aortic branching pattern. The visualized proximal subclavian arteries are widely patent. RIGHT CAROTID SYSTEM: No dissection, occlusion or aneurysm. Mild atherosclerotic calcification at the carotid bifurcation without hemodynamically significant stenosis. LEFT CAROTID SYSTEM: No dissection, occlusion or aneurysm. Mild atherosclerotic calcification at the carotid bifurcation without hemodynamically significant stenosis. VERTEBRAL ARTERIES: Left dominant configuration. Both origins are clearly patent. There is no dissection, occlusion or flow-limiting stenosis to the skull base (V1-V3 segments). CTA HEAD FINDINGS POSTERIOR CIRCULATION: --Vertebral arteries: Normal V4 segments. --Inferior cerebellar arteries: Normal. --Basilar artery: Normal. --Superior cerebellar arteries: Normal. --Posterior cerebral arteries (PCA): Normal. ANTERIOR CIRCULATION: --Intracranial internal carotid arteries: Normal. --Anterior cerebral arteries (ACA): Normal. Both A1 segments are  present. Patent anterior communicating artery (a-comm). --Middle cerebral arteries (MCA): Normal. VENOUS SINUSES: As permitted by contrast timing, patent. ANATOMIC VARIANTS: None Review of the MIP images confirms the above findings. IMPRESSION: 1. No emergent large vessel occlusion or high-grade stenosis of the head or neck. Aortic Atherosclerosis (ICD10-I70.0). Electronically Signed   By: Ulyses Jarred M.D.   On: 12/16/2020 21:21   MR BRAIN WO CONTRAST  Result Date: 12/16/2020 CLINICAL DATA:  Lower extremity weakness.  Renal cell carcinoma. EXAM: MRI HEAD WITHOUT CONTRAST TECHNIQUE: Multiplanar, multiecho pulse sequences of the brain and surrounding structures were obtained without intravenous contrast. COMPARISON:  Brain MRI 07/06/2016 FINDINGS: Brain: There is a small focus of acute ischemia in the superior left hemisphere. Focus of chronic microhemorrhage in the left basal ganglia. No acute hemorrhage. There is multifocal hyperintense T2-weighted signal within the white matter. Generalized volume loss without a clear lobar predilection. The midline structures are normal. Vascular: Major flow voids are preserved. Skull and upper cervical spine: Normal calvarium and skull base. Visualized  upper cervical spine and soft tissues are normal. Unchanged occipital sebaceous cyst. Sinuses/Orbits:No paranasal sinus fluid levels or advanced mucosal thickening. No mastoid or middle ear effusion. Normal orbits. IMPRESSION: 1. Small focus of acute ischemia in the superior left hemisphere. No hemorrhage or mass effect. 2. Findings of chronic microvascular ischemia and generalized volume loss . 3. Assessment for metastatic disease limited without intravenous contrast. Electronically Signed   By: Ulyses Jarred M.D.   On: 12/16/2020 01:01   MR THORACIC SPINE W WO CONTRAST  Result Date: 12/16/2020 CLINICAL DATA:  Renal cell carcinoma. Bilateral lower extremity weakness. EXAM: MRI THORACIC AND LUMBAR SPINE WITHOUT AND WITH  CONTRAST TECHNIQUE: Multiplanar and multiecho pulse sequences of the thoracic and lumbar spine were obtained without and with intravenous contrast. CONTRAST:  30mL GADAVIST GADOBUTROL 1 MMOL/ML IV SOLN COMPARISON:  CT thoracic and lumbar spine 12/15/2020 FINDINGS: MRI THORACIC SPINE FINDINGS Alignment:  Physiologic. Vertebrae: There is signal change and mild contrast enhancement at the endplates of D34-534, X33443 and T10-11. No discrete masslike lesion. Cord:  Normal Paraspinal and other soft tissues: Limited assessment of the lungs. Disc levels: No disc herniation or spinal canal stenosis. MRI LUMBAR SPINE FINDINGS Segmentation:  Standard. Alignment: Grade 1 retrolisthesis at L3-4 and grade 1 anterolisthesis at L4-5. Vertebrae: Multilevel degenerative endplate signal changes. No abnormal contrast enhancement. Conus medullaris: Extends to the L1 level and appears normal. 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 Disc levels: L1-L2: Normal disc space and facet joints. No spinal canal stenosis. No neural foraminal stenosis. L2-L3: Normal disc space and facet joints. No spinal canal stenosis. No neural foraminal stenosis. L3-L4: Disc space narrowing with right asymmetric disc bulge. No spinal canal stenosis. Moderate right and mild left neural foraminal stenosis. L4-L5: Moderate facet hypertrophy with small disc bulge. No spinal canal stenosis. No neural foraminal stenosis. L5-S1: Normal disc space and facet joints. No spinal canal stenosis. No neural foraminal stenosis. Visualized sacrum: Normal. IMPRESSION: 1. 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 2. No evidence of metastatic disease to the thoracic or lumbar spine. 3. Mild edema and contrast enhancement at the endplates at  D34-534, X33443 and T10-11 without discrete masslike lesion. This is favored to be degenerative. 4. Moderate right L3-4 neural foraminal stenosis. Electronically Signed   By: Ulyses Jarred M.D.   On: 12/16/2020 01:10   MR Lumbar Spine W Wo Contrast  Result Date: 12/16/2020 CLINICAL DATA:  Renal cell carcinoma. Bilateral lower extremity weakness. EXAM: MRI THORACIC AND LUMBAR SPINE WITHOUT AND WITH CONTRAST TECHNIQUE: Multiplanar and multiecho pulse sequences of the thoracic and lumbar spine were obtained without and with intravenous contrast. CONTRAST:  58mL GADAVIST GADOBUTROL 1 MMOL/ML IV SOLN COMPARISON:  CT thoracic and lumbar spine 12/15/2020 FINDINGS: MRI THORACIC SPINE FINDINGS Alignment:  Physiologic. Vertebrae: There is signal change and mild contrast enhancement at the endplates of D34-534, X33443 and T10-11. No discrete masslike lesion. Cord:  Normal Paraspinal and other soft tissues: Limited assessment of the lungs. Disc levels: No disc herniation or spinal canal stenosis. MRI LUMBAR SPINE FINDINGS Segmentation:  Standard. Alignment: Grade 1 retrolisthesis at L3-4 and grade 1 anterolisthesis at L4-5. Vertebrae: Multilevel degenerative endplate signal changes. No abnormal contrast enhancement. Conus medullaris: Extends to the L1 level and appears normal. Paraspinal and other soft tissues: Redemonstration  of large retroperitoneal mass that encases the infrarenal inferior vena cava of with loss of the normal flow void. Disc levels: L1-L2: Normal disc space and facet joints. No spinal canal stenosis. No neural foraminal stenosis. L2-L3: Normal disc space and facet joints. No spinal canal stenosis. No neural foraminal stenosis. L3-L4: Disc space narrowing with right asymmetric disc bulge. No spinal canal stenosis. Moderate right and mild left neural foraminal stenosis. L4-L5: Moderate facet hypertrophy with small disc bulge. No spinal canal stenosis. No neural foraminal stenosis. L5-S1: Normal disc space and facet  joints. No spinal canal stenosis. No neural foraminal stenosis. Visualized sacrum: Normal. IMPRESSION: 1. 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 2. No evidence of metastatic disease to the thoracic or lumbar spine. 3. Mild edema and contrast enhancement at the endplates at D34-534, X33443 and T10-11 without discrete masslike lesion. This is favored to be degenerative. 4. Moderate right L3-4 neural foraminal stenosis. Electronically Signed   By: Ulyses Jarred M.D.   On: 12/16/2020 01:10   CT CHEST ABDOMEN PELVIS W CONTRAST  Result Date: 12/15/2020 CLINICAL DATA:  Renal cell carcinoma abdominal mass. started Chemo 12/22 sent here by [facility] center for further evaluation EXAM: CT CHEST, ABDOMEN, AND PELVIS WITH CONTRAST TECHNIQUE: Multidetector CT imaging of the chest, abdomen and pelvis was performed following the standard protocol during bolus administration of intravenous contrast. CONTRAST:  114mL OMNIPAQUE IOHEXOL 300 MG/ML  SOLN COMPARISON:  MRI abdomen 07/20/2015, CT abdomen pelvis 07/06/2015. FINDINGS: CT CHEST FINDINGS Cardiovascular: Left atrial enlargement. The remainder of the heart is normal in caliber. For. No significant pericardial effusion. The thoracic aorta is normal in caliber. At least moderate atherosclerotic plaque of the thoracic aorta. At least moderate three-vessel coronary artery calcifications. The main pulmonary artery is normal in caliber. No central pulmonary embolus. Mediastinum/Nodes: No enlarged mediastinal, hilar, or axillary lymph nodes. Thyroid gland, trachea, and esophagus demonstrate no significant findings. Lungs/Pleura: Bilateral lower lobe subsegmental atelectasis. Lingular atelectasis. No focal consolidation. There is a round solid 4 mm nodule within the left upper lobe. Micronodule within the right upper lobe (5:38). Micronodule within the right lower lobe  (5:80). Subpleural micronodule within the right upper lobe (5:64). No pulmonary mass. No pleural effusion. No pneumothorax. Musculoskeletal: Slightly asymmetric gynecomastia, right greater than left. No suspicious lytic or blastic osseous lesions. No acute displaced fracture. Multilevel degenerative changes of the spine. Please see separately dictated CT thoracolumbar spine 12/15/2020. CT ABDOMEN PELVIS FINDINGS Hepatobiliary: No focal liver abnormality. At least 2.8 cm peripherally calcified gallstone within the gallbladder lumen. No associated gallbladder wall thickening or pericholecystic fluid. No biliary dilatation. Pancreas: No focal lesion. Normal pancreatic contour. No surrounding inflammatory changes. No main pancreatic ductal dilatation. Spleen: Normal in size without focal abnormality. Adrenals/Urinary Tract: No adrenal nodule bilaterally. Status post left nephrectomy. The right kidney enhances homogeneous Lea. No nephrolithiasis. No hydronephrosis. No hydroureter. The urinary bladder is unremarkable. Status post Stomach/Bowel: Stomach is within normal limits. No evidence of small bowel wall thickening or dilatation. Diffuse sigmoid and descending colon diverticulosis this cysts with slightly increased trace pericolonic fat stranding but no definite bowel wall thickening. The appendix appears normal. Vascular/Lymphatic: No abdominal aorta or iliac aneurysm. Severe calcified and noncalcified atherosclerotic plaque of the aorta and its branches. Suggestion of prior retroperitoneal lymph node dissection. Heterogeneous lobulated 7.6 x 7.7 x 11.9 cm retroperitoneal mass that is noted to be inseparable from  the inferior vena cava, third portion of the duodenum, aorta, and origin/proximal inferior mesenteric artery. No pelvic or inguinal lymphadenopathy. The mass is noted to encase the inferior vena cava. Intraluminal extension of the mass not excluded. No findings suggest extension of the mass into the right  renal vein or right cardiac atrium. Reproductive: Prostate is unremarkable. Other: Trace simple free fluid within the abdomen pelvis. No intraperitoneal free gas. No organized fluid collection. Musculoskeletal: No abdominal wall hernia or abnormality. No suspicious lytic or blastic osseous lesions. No acute displaced fracture. Multilevel degenerative changes of the spine. Please see separately dictated CT thoracolumbar spine 12/15/2020. IMPRESSION: 1. A 7.6 x 7.7 x 11.9 cm heterogeneous retroperitoneal mass is inseparable from the inferior vena cava, third portion of the duodenum, aorta, and origin/proximal inferior mesenteric artery. The mass is noted to encase the inferior vena cava with intraluminal extension not excluded. 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 2. Left atrial enlargement. 3. Diffuse descending colon and sigmoid diverticulosis with no CT findings of definite acute diverticulitis. 4. Cholelithiasis with no CT findings of acute cholecystitis or choledocholithiasis. 5. Slightly asymmetric gynecomastia, right greater than left. Recommend mammographic evaluation. 6.  Aortic Atherosclerosis (ICD10-I70.0). 7. Please see separately dictated CT thoracolumbar spine 12/15/2020. Electronically Signed   By: Iven Finn M.D.   On: 12/15/2020 21:37   CT T-SPINE NO CHARGE  Result Date: 12/15/2020 CLINICAL DATA:  Bilateral lower extremity weakness. EXAM: CT Thoracic and Lumbar spine without contrast TECHNIQUE: Multiplanar CT images of the thoracic and lumbar spine were reconstructed from contemporary CT of the Chest, Abdomen, and Pelvis CONTRAST:  No additional contrast administered COMPARISON:  None FINDINGS: CT THORACIC SPINE FINDINGS Alignment: Mild dextroscoliosis. Vertebrae: No acute fracture or focal pathologic process. Paraspinal and other soft tissues: Please see report for concomitant CT of the chest, abdomen and pelvis.  Disc levels: There is no spinal canal stenosis. CT LUMBAR SPINE FINDINGS Segmentation: Standard Alignment: Normal Vertebrae: No acute fracture or focal pathologic process. Paraspinal and other soft tissues: Retroperitoneal mass better characterized on concomitant CT of the chest, abdomen and pelvis. Disc levels: There is moderate L4-5 facet arthrosis. No lumbar spinal canal stenosis or neural impingement. IMPRESSION: 1. No acute abnormality of the thoracic or lumbar spine. 2. Retroperitoneal mass better characterized on concomitant CT of the chest, abdomen and pelvis. Electronically Signed   By: Ulyses Jarred M.D.   On: 12/15/2020 21:20   CT L-SPINE NO CHARGE  Result Date: 12/15/2020 CLINICAL DATA:  Bilateral lower extremity weakness. EXAM: CT Thoracic and Lumbar spine without contrast TECHNIQUE: Multiplanar CT images of the thoracic and lumbar spine were reconstructed from contemporary CT of the Chest, Abdomen, and Pelvis CONTRAST:  No additional contrast administered COMPARISON:  None FINDINGS: CT THORACIC SPINE FINDINGS Alignment: Mild dextroscoliosis. Vertebrae: No acute fracture or focal pathologic process. Paraspinal and other soft tissues: Please see report for concomitant CT of the chest, abdomen and pelvis. Disc levels: There is no spinal canal stenosis. CT LUMBAR SPINE FINDINGS Segmentation: Standard Alignment: Normal Vertebrae: No acute fracture or focal pathologic process. Paraspinal and other soft tissues: Retroperitoneal mass better characterized on concomitant CT of the chest, abdomen and pelvis. Disc levels: There is moderate L4-5 facet arthrosis. No lumbar spinal canal stenosis or neural impingement. IMPRESSION: 1. No acute abnormality of the thoracic or lumbar spine. 2. Retroperitoneal mass better characterized on concomitant CT of the chest, abdomen and pelvis. Electronically Signed   By: Lennette Bihari  Chase Picket M.D.   On: 12/15/2020 21:20   ECHOCARDIOGRAM COMPLETE BUBBLE STUDY  Result Date:  12/16/2020    ECHOCARDIOGRAM REPORT   Patient Name:   STEWART SASAKI Date of Exam: 12/16/2020 Medical Rec #:  [id number]         Height:       66.0 in Accession #:    [id number]        Weight:       225.0 lb Date of Birth:  [birth year]         BSA:          2.102 m Patient Age:    79 years          BP:           116/65 mmHg Patient Gender: M                 HR:           73 bpm. Exam Location:  Inpatient Procedure: 2D Echo and Saline Contrast Bubble Study Indications:    Stroke  History:        Patient has prior history of Echocardiogram examinations, most                 recent 08/12/2015. CHF, Arrythmias:Atrial Fibrillation; Risk                 Factors:Hypertension.  Sonographer:    Thurman Coyer RDCS (AE) Referring Phys: [phone] ERIC LINDZEN IMPRESSIONS  1. Left ventricular ejection fraction, by estimation, is 60 to 65%. The left ventricle has normal function. The left ventricle has no regional wall motion abnormalities. There is mild left ventricular hypertrophy. Left ventricular diastolic parameters are indeterminate.  2. Right ventricular systolic function is mildly reduced. The right ventricular size is mildly enlarged.  3. Left atrial size was severely dilated.  4. The mitral valve is abnormal. Trivial mitral valve regurgitation. No evidence of mitral stenosis. Moderate mitral annular calcification.  5. The aortic valve is tricuspid. There is moderate calcification of the aortic valve. Aortic valve regurgitation is not visualized. Mild aortic valve stenosis.  6. Aortic dilatation noted. There is dilatation of the ascending aorta, measuring 44 mm.  7. Agitated saline contrast bubble study was negative, with no evidence of any interatrial shunt. FINDINGS  Left Ventricle: Left ventricular ejection fraction, by estimation, is 60 to 65%. The left ventricle has normal function. The left ventricle has no regional wall motion abnormalities. The left ventricular internal cavity size was normal in size. There is   mild left ventricular hypertrophy. Left ventricular diastolic parameters are indeterminate. Right Ventricle: The right ventricular size is mildly enlarged. Right vetricular wall thickness was not well visualized. Right ventricular systolic function is mildly reduced. Left Atrium: Left atrial size was severely dilated. Right Atrium: Right atrial size was normal in size. Pericardium: There is no evidence of pericardial effusion. Mitral Valve: The mitral valve is abnormal. Moderate mitral annular calcification. Trivial mitral valve regurgitation. No evidence of mitral valve stenosis. Tricuspid Valve: The tricuspid valve is normal in structure. Tricuspid valve regurgitation is trivial. Aortic Valve: The aortic valve is tricuspid. There is moderate calcification of the aortic valve. Aortic valve regurgitation is not visualized. Mild aortic stenosis is present. Aortic valve mean gradient measures 8.2 mmHg. Aortic valve peak gradient measures 18.6 mmHg. Aortic valve area, by VTI measures 1.73 cm. Pulmonic Valve: The pulmonic valve was not well visualized. Pulmonic valve regurgitation is not visualized. Aorta: The aortic root is normal in  size and structure and aortic dilatation noted. There is dilatation of the ascending aorta, measuring 44 mm. Venous: The inferior vena cava was not well visualized. IAS/Shunts: The interatrial septum was not well visualized. Agitated saline contrast was given intravenously to evaluate for intracardiac shunting. Agitated saline contrast bubble study was negative, with no evidence of any interatrial shunt.  LEFT VENTRICLE PLAX 2D LVIDd:         3.80 cm  Diastology LVIDs:         2.70 cm  LV e' medial:   9.36 cm/s LV PW:         1.30 cm  LV E/e' medial: 0.9 LV IVS:        1.30 cm LVOT diam:     2.50 cm LV SV:         77 LV SV Index:   36 LVOT Area:     4.91 cm  RIGHT VENTRICLE RV S prime:     10.20 cm/s TAPSE (M-mode): 1.1 cm LEFT ATRIUM              Index       RIGHT ATRIUM           Index  LA diam:        6.20 cm  2.95 cm/m  RA Area:     16.50 cm LA Vol (A2C):   114.0 ml 54.22 ml/m RA Volume:   37.60 ml  17.88 ml/m LA Vol (A4C):   126.0 ml 59.93 ml/m LA Biplane Vol: 120.0 ml 57.08 ml/m  AORTIC VALVE AV Area (Vmax):    1.62 cm AV Area (Vmean):   1.80 cm AV Area (VTI):     1.73 cm AV Vmax:           215.80 cm/s AV Vmean:          132.200 cm/s AV VTI:            0.443 m AV Peak Grad:      18.6 mmHg AV Mean Grad:      8.2 mmHg LVOT Vmax:         71.10 cm/s LVOT Vmean:        48.400 cm/s LVOT VTI:          0.156 m LVOT/AV VTI ratio: 0.35  AORTA Ao Root diam: 3.70 cm MV E velocity: 8.49 cm/s  TRICUSPID VALVE                           TR Peak grad:   21.2 mmHg                           TR Vmax:        230.00 cm/s                            SHUNTS                           Systemic VTI:  0.16 m                           Systemic Diam: 2.50 cm Oswaldo Milian MD Electronically signed by Oswaldo Milian MD Signature Date/Time: 12/16/2020/11:43:55 AM    Final      Assessment/Plan: Diagnosis: Acute ischemic stroke 1. Does the need  for close, 24 hr/day medical supervision in concert with the patient's rehab needs make it unreasonable for this patient to be served in a less intensive setting? Yes 2. Co-Morbidities requiring supervision/potential complications:  1. Lower extremity weakness bilaterally: will require intensive PT and OT 2. Obesity (BMI 36.32): will require counseling 3. Renal cell cancer: will require follow-up from [facility] oncology 4. Atrial fibrillation: monitor HR TID 3. Due to bladder management, bowel management, safety, skin/wound care, disease management, medication administration, pain management and patient education, does the patient require 24 hr/day rehab nursing? Yes 4. Does the patient require coordinated care of a physician, rehab nurse, therapy disciplines of OT, PT to address physical and functional deficits in the context of the above medical  diagnosis(es)? Yes Addressing deficits in the following areas: balance, endurance, locomotion, strength, transferring, bowel/bladder control, bathing, dressing, feeding, grooming, toileting and psychosocial support 5. Can the patient actively participate in an intensive therapy program of at least 3 hrs of therapy per day at least 5 days per week? Yes 6. The potential for patient to make measurable gains while on inpatient rehab is excellent 7. Anticipated functional outcomes upon discharge from inpatient rehab are modified independent  with PT, modified independent with OT, independent with SLP. 8. Estimated rehab length of stay to reach the above functional goals is: 12-16 days 9. Anticipated discharge destination: Home 10. Overall Rehab/Functional Prognosis: excellent  RECOMMENDATIONS: This patient's condition is appropriate for continued rehabilitative care in the following setting: CIR Patient has agreed to participate in recommended program. Yes Note that insurance prior authorization may be required for reimbursement for recommended care.  Comment: Thank you for this consult. Admission coordinator to follow.   I have personally performed a face to face diagnostic evaluation, including, but not limited to relevant history and physical exam findings, of this patient and developed relevant assessment and plan.  Additionally, I have reviewed and concur with the physician assistant's documentation above.  Leeroy Cha, MD  Lavon Paganini [place], PA-C 12/17/2020

## 2020-12-17 NOTE — Progress Notes (Signed)
Brief HPI: 79 year old male with a significant medical history (see H&P) who presented to the ER 12/29 for sudden onset bilateral leg weakness and a sensation of "heaviness" affecting his mobility. He also endorses increasing painful paresthesias from his feet to his knees bilaterally and a headache since his onset of weakness. At baseline, he walks without assistance but had sudden onset of leg weakness with buckling at the knees and "rubbery" feeling legs 12/27 when attempting to go to the restroom after sitting for about 2 hours and watching television. He is able to ambulate with a walker using his arm strength but the lower extremity weakness and heaviness has persisted since 12/27.  Subjective: Patient laying in bed, complains of feeling more lethargic than usual but states he often feels tired with his atrial fibrillation. States his weakness waxes and wanes in the hospital claiming yesterday afternoon he felt they were getting stronger then in the evening he had significant cramping and burning pains from his knees to his feet bilaterally. States his right leg feels weaker than his left leg. Denies saddle anesthesia, visual changes, bowel or bladder incontinence, shortness of breath, or progressive numbness and weakness since hospital admission. Spoke with patient about fluoro-guided LP tomorrow and he is agreeable.   Exam: Vitals:   12/17/20 0420 12/17/20 0821  BP: (!) 139/57   Pulse: 68   Resp: 13   Temp: 98.2 F (36.8 C) (!) 97.5 F (36.4 C)  SpO2: 96%    Gen: Awake, communicative, in bed, no acute distress Resp: non-labored breathing Abd: soft, rounded, non-tender  Neuro: MS: Awake, alert, oriented to person, place, time, and situation. Patient is able to give a clear and coherent history. No signs of aphasia or neglect noted.  CN: II: Visual fields full, wears eyeglasses. PERRL 26mm brisk bilaterally. III, IV, VI: EOMI without ptosis or diplopia V: Facial sensation symmetric to  light touch and cool temperature VII: Face symmetric resting/smiling.  VIII: Hearing intact to voice IX, X: Phonation intact XI: Shoulder shrug symmetric bilaterally XII: Tongue protrudes midline Motor: 5/5 strength with no pronator drift and equal grip strength to bilateral upper extremities. Bilateral lower extremities 5/5 strength with subjective weakness and without pronator drift. Right lower extremity requires more patient effort for anti-gravity movement than left lower extremity.  4/5 hip/knee flexion on RLE. 5/5 hip/knee flexion LLE.  4/5 ankle dorsiflexion and plantar flexion bilaterally.  Bulk and tone are normal. Sensory: Sensation to light touch is equal and symmetric in BUE. Sensation to light touch is equal and symmetric in BLE with subjective decease in sensation bilaterally. Hypoesthesia distally in BLE with improvement proximally. Sensation to vibration minimal in RLE distally and improves proximally. Sensation to vibration intact to distal and proximal left lower extremity with subjective improvement in sensation in the proximal LLE. 2-point discrimination intact.  DTR: DTRs absent in the lower extremities. 2+ and symmetric brachioradialis and biceps.  Plantars: Toes are downgoing bilaterally, left great toe chronically upgoing. Gait: deferred  Stroke work up results so far: EKG: Atrial fibrillation with slow ventricular response Left axis deviation Low voltage QRS Inferior infarct , age undetermined Cannot rule out Anterior infarct , age undetermined Abnormal ECG  MRI brain:  1. Small focus of acute ischemia in the superior left hemisphere. No hemorrhage or mass effect. 2. Findings of chronic microvascular ischemia and generalized volume loss . 3. Assessment for metastatic disease limited without intravenous contrast.  TTE: 1. Left ventricular ejection fraction, by estimation, is 60 to 65%. The  left ventricle has normal function. The left ventricle has no  regional wall motion abnormalities. There is mild left ventricular hypertrophy. Left ventricular diastolic parameters are indeterminate.  2. Right ventricular systolic function is mildly reduced. The right ventricular size is mildly enlarged.  3. Left atrial size was severely dilated.  4. The mitral valve is abnormal. Trivial mitral valve regurgitation. No evidence of mitral stenosis. Moderate mitral annular calcification.  5. The aortic valve is tricuspid. There is moderate calcification of the aortic valve. Aortic valve regurgitation is not visualized. Mild aortic valve stenosis.  6. Aortic dilatation noted. There is dilatation of the ascending aorta, measuring 44 mm.  7. Agitated saline contrast bubble study was negative, with no evidence of any interatrial shunt.   CTA of head and neck: Aortic atheroclerosis 1. No emergent large vessel occlusion or high-grade stenosis of the head or neck.  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 Will need to hold IV heparin prior to LP then restart afterwards.  - Risks/benefit profile may be more in favor of electing for empiric IVIG treatment given rapid progression of his BLE symptoms with areflexia on initial Neurology exam. - BLE strength intact without progressive worsening in strength since admission. Sudden onset of weakness 12/27 affecting mobility. Etiology possible GBS versus imaging undetected spinal stroke possible. The latter seems more likely with sudden onset of weakness without gradual progression; given that he has some preservation of BLE function, if it is a spinal stroke cord stroke, it is likely too small to be detected on MRI.  - Fluoro guided LP has been ordered. Fluoro guidance is needed due to need for close timing as heparin drip will need to be held, serial heparin levels drawn and when  levels are less than 0.1 the LP will need to be performed as soon as possible followed by restarting of heparin gtt 2 hours after the procedure. The patient is also obese with limited mobility, which makes bedside LP without imaging guidance unlikely to be successful. Will speak with Interventional Radiology in the AM.   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 Per July 2021 Oncology note, plan at that time was for follow up in 3 months towards the goal of pursuing additional treatment for the cancer recurrence - May benefit from transfer to [facility] due to complexity of his Oncology treatment and surveillance plans as documented in his [place] follow up note from July.  Small acute ischemic infarction in the left cerebral hemisphere superiorly - Most likely unrelated to his clinical presentation - Stroke work up completed with results as documented above. - No indications for changing treatment from anticoagulation in the setting of his atrial fibrillation, based upon results of stroke work up.   Electronically signed: Dr. Kerney Elbe

## 2020-12-17 NOTE — Progress Notes (Signed)
Pt able to place self on cpap when ready for bed.

## 2020-12-17 NOTE — Progress Notes (Signed)
ANTICOAGULATION CONSULT NOTE - Initial Consult  Pharmacy Consult for heparin Indication: atrial fibrillation  Allergies  Allergen Reactions  . Pembrolizumab Other (See Comments)    1) severe neuropathy 2) may or may not be causing GBS as well, this still TBD, see 12/16/2020 admission for further details  . Diltiazem Other (See Comments)    Pt didn't feel well     Patient Measurements: Height: 5\' 6"  (167.6 cm) Weight: 102.1 kg (225 lb) IBW/kg (Calculated) : 63.8 Heparin Dosing Weight: 86.4 kg  Vital Signs: Temp: 98.2 F (36.8 C) (12/30 1235) Temp Source: Oral (12/30 1235) BP: 130/57 (12/30 1235) Pulse Rate: 47 (12/30 1235)  Labs: Recent Labs    12/15/20 1651 12/17/20 0314 12/17/20 0347 12/17/20 1133  HGB 12.7* 12.5*  --   --   HCT 40.6 38.1*  --   --   PLT 390 395  --   --   APTT  --   --  85* 75*  HEPARINUNFRC  --   --  0.79* 0.62  CREATININE 1.04 1.21  --   --     Estimated Creatinine Clearance: 55.4 mL/min (by C-G formula based on SCr of 1.21 mg/dL).   Medical History: Past Medical History:  Diagnosis Date  . Anticoagulated on Coumadin    followed by [facility]  . Atrial fibrillation (HCC)   . Back pain   . Bladder mass   . BPH (benign prostatic hypertrophy)   . Chronic atrial fibrillation (HCC)   . Diastolic CHF, chronic [facility])    cardiologist-- dr [facility]-- asymptomatic  . Diverticulosis of colon   . Edema   . Essential tremor 06-2015  . History of urinary retention   . HTN (hypertension)   . OSA on CPAP   . Peripheral neuropathy    BILATERAL FEET  . Prediabetes   . PTSD (post-traumatic stress disorder)   . Renal cell carcinoma of left kidney (HCC)     Medications:  See med history  Assessment: 79 yo man to hold eliquis and start heparin for afib.  Last dose eliquis 12/29  Hg 12.5, PTLC 395 Heparin level 0.62 and APTT 75-almost correlating  Goal of Therapy:  Heparin level 0.3-0.7 units/ml aPTT 66-102 seconds Monitor  platelets by anticoagulation protocol: Yes   Plan:  Continue heparin at 1350 units/hr Check aPTT and heparin level daily  Martrice Apt A. 1/30, PharmD, BCPS, FNKF Clinical Pharmacist [place] Please utilize Amion for appropriate phone number to reach the unit pharmacist [facility])   12/17/2020,12:54 PM

## 2020-12-17 NOTE — Progress Notes (Signed)
Physical Therapy Treatment Patient Details Name: Evan Kirk MRN: [id number] DOB: 1941/06/24 Today's Date: 12/17/2020    History of Present Illness Pt presented to ED with c/o LE weakness, tingling, and burning as well as pain across his back.  MRI of brain showed small acute ischemic stroke at top of Lt motor strip.  Suspect bil. LE numbness and tingling is due to neuropathy, which is chronic, but has worsed due to immunotherapy for CA vs GBS.   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  He was able to ambulate 8' with min A of 2 , chair follow, and frequent cues.  Continues to demonstrate poor R LE control/buckling requiring close chair follow.   Provided assist and cues for exercises to limit compensatory techniques.  Also, educated on exercises that could be performed independently at bed/chair level. Pt very motivated and continues to have good rehab potential.   Follow Up Recommendations  CIR     Equipment Recommendations  Wheelchair cushion (measurements PT);Wheelchair (measurements PT)    Recommendations for Other Services       Precautions / Restrictions Precautions Precautions: Fall Precaution Comments: Pt initially denied h/o falls, but than later states he slid off the couch a couple of weeks ago    Mobility  Bed Mobility Overal bed mobility: Needs Assistance Bed Mobility: Supine to Sit     Supine to sit: Min assist     General bed mobility comments: Min A to lift trunk  Transfers Overall transfer level: Needs assistance Equipment used: Rolling walker (2 wheeled) Transfers: Sit to/from Stand   Stand pivot transfers: Min assist;+2 safety/equipment       General transfer comment: Min A to steady as legs buckling; cues for use of RW; performed  x 3  Ambulation/Gait Ambulation/Gait assistance: Min assist;+2 safety/equipment Gait Distance (Feet): 8 Feet Assistive device: Rolling walker (2 wheeled) Gait Pattern/deviations: Step-to pattern;Decreased stride length;Decreased stance time - right;Decreased dorsiflexion - right;Decreased weight shift to right Gait velocity: decreased   General Gait Details: Cues for sequencing, chair follow, min A for steadying, knee buckling on R during stance but able to correct with cues for use of UE and focus   Stairs             Wheelchair Mobility    Modified Rankin (Stroke Patients Only) Modified Rankin (Stroke Patients Only) Pre-Morbid Rankin Score: No significant disability Modified Rankin: Moderately severe disability     Balance Overall balance assessment: Needs assistance Sitting-balance support: Feet supported Sitting balance-Leahy Scale: Good     Standing balance support: Bilateral upper extremity supported Standing balance-Leahy Scale: Poor Standing balance comment: Required RW and min A                            Cognition Arousal/Alertness: Awake/alert Behavior During Therapy: WFL for tasks assessed/performed Overall Cognitive Status: Impaired/Different from baseline Area of Impairment: Safety/judgement                         Safety/Judgement: Decreased awareness of safety;Decreased awareness of deficits     General Comments: Decreased awareness of R LE strength - talking about how he can walk and stand with supervision      Exercises  General Exercises - Lower Extremity Ankle Circles/Pumps: AROM;Both;15 reps;Seated Quad Sets: AROM;Both;15 reps;Supine Long Arc Quad: AROM;Right;5 reps;Seated (cues for controlled descent) Hip ABduction/ADduction: AAROM;Right;5 reps;Supine Hip Flexion/Marching: AAROM;Right;5 reps;Supine    General Comments General comments (skin integrity, edema, etc.): HR 55-83 bpm; BP stable; no c/o dizziness       Pertinent Vitals/Pain Pain Assessment: Faces Faces Pain Scale: Hurts even more Pain Location: back pain - when legs lifted in recliner Pain Descriptors / Indicators: Aching Pain Intervention(s): Limited activity within patient's tolerance;Monitored during session;Repositioned    Home Living                      Prior Function            PT Goals (current goals can now be found in the care plan section) Acute Rehab PT Goals Patient Stated Goal: to be able to take care of self PT Goal Formulation: With patient Time For Goal Achievement: 12/30/20 Potential to Achieve Goals: Good Progress towards PT goals: Progressing toward goals    Frequency    Min 4X/week      PT Plan Current plan remains appropriate    Co-evaluation              AM-PAC PT "6 Clicks" Mobility   Outcome Measure  Help needed turning from your back to your side while in a flat bed without using bedrails?: A Lot Help needed moving from lying on your back to sitting on the side of a flat bed without using bedrails?: A Lot Help needed moving to and from a bed to a chair (including a wheelchair)?: A Lot Help needed standing up from a chair using your arms (e.g., wheelchair or bedside chair)?: A Lot Help needed to walk in hospital room?: A Lot Help needed climbing 3-5 steps with a railing? : Total 6 Click Score: 11    End of Session Equipment Utilized During Treatment: Gait belt Activity Tolerance: Patient tolerated treatment well Patient left: with call bell/phone within reach;with chair alarm set;in chair;with family/visitor present Nurse Communication: Mobility status PT Visit Diagnosis: Unsteadiness on feet (R26.81);Muscle weakness (generalized) (M62.81)     Time: [id number] PT Time Calculation (min) (ACUTE ONLY): 38 min  Charges:  $Gait Training: 8-22 mins $Therapeutic Exercise: 8-22 mins $Therapeutic Activity: 8-22 mins                     Abran Richard, PT Acute Rehab  Services Pager [phone] [facility] [place] 12/17/2020, 5:13 PM

## 2020-12-17 NOTE — Progress Notes (Signed)
ANTICOAGULATION CONSULT NOTE  Pharmacy Consult for heparin Indication: atrial fibrillation  Allergies  Allergen Reactions  . Pembrolizumab Other (See Comments)    1) severe neuropathy 2) may or may not be causing GBS as well, this still TBD, see 12/16/2020 admission for further details  . Diltiazem Other (See Comments)    Pt didn't feel well     Patient Measurements: Height: 5\' 6"  (167.6 cm) Weight: 102.1 kg (225 lb) IBW/kg (Calculated) : 63.8 Heparin Dosing Weight: 86.4 kg  Vital Signs: Temp: 98.5 F (36.9 C) (12/29 2334) Temp Source: Oral (12/29 2334) BP: 112/66 (12/29 2334) Pulse Rate: 40 (12/30 0130)  Labs: Recent Labs    12/15/20 1651 12/17/20 0314 12/17/20 0347  HGB 12.7* 12.5*  --   HCT 40.6 38.1*  --   PLT 390 395  --   APTT  --   --  85*  HEPARINUNFRC  --   --  0.79*  CREATININE 1.04 1.21  --     Estimated Creatinine Clearance: 55.4 mL/min (by C-G formula based on SCr of 1.21 mg/dL).  Assessment: 79 y.o. male with h/o Afib, Eliquis on hold, for heparin  Goal of Therapy:  Heparin level 0.3-0.7 units/ml aPTT 66-102 seconds Monitor platelets by anticoagulation protocol: Yes   Plan:  Continue Heparin at current rate   Sharnay Cashion, 76 12/17/2020,4:31 AM

## 2020-12-17 NOTE — Progress Notes (Signed)
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  He is recently started immunotherapy at [facility] using pembrolizumab/axitinib.  He presented to the ED 12/29 with leg weakness tingling and burning for 48 hours with the right leg greater than left leg weakness as well.  In the ED CT head was unremarkable. CT thoracic lumbar spine showed mass as described below. MRI brain shows a punctate acute infarct in the superior left frontal hemisphere, close to the motor strip. No bleeding. No mass-effect. MRI of the thoracic and lumbar spine with no evidence of spinal cord infarct.  Consultants   PCCM  Neurology  Rehab medicine  Procedures   None  Antibiotics   Anti-infectives (From admission, onward)   None      Subjective  The patient is resting comfortably. He is complaining of worsening low back pain and continued weakness of the lower extremities.  Objective   Vitals:  Vitals:   12/17/20 1235 12/17/20 1544  BP: (!) 130/57 127/69  Pulse: (!) 47 (!) 54  Resp: 14 18  Temp: 98.2 F (36.8 C) 97.9 F (36.6 C)  SpO2: 96% 97%   Exam:  Constitutional:   The patient is awake, alert, and oriented x 3. No acute distress. Respiratory:   No increased work of breathing.  No wheezes, rales, or rhonchi  No tactile fremitus Cardiovascular:   Regular rate and rhythm  No murmurs, ectopy, or gallups.  No lateral PMI. No thrills. Abdomen:   Abdomen is soft, non-tender, non-distended  No hernias, masses, or organomegaly  Normoactive bowel sounds.  Musculoskeletal:   No cyanosis, clubbing, or edema Skin:   No rashes, lesions, ulcers  palpation of skin: no induration or nodules Neurologic:   CN 2-12  intact  Sensation all 4 extremities intact  Weakness of lower extremities bilaterally. Left greater than right. Psychiatric:   Mental status o Mood, affect appropriate o Orientation to person, place, time   judgment and insight appear intact I have personally reviewed the following:   Today's Data   Vitals, CMP, CBC  Imaging   CTA head and neck.  Cardiology Data   Echocardiogram  Scheduled Meds:  cholecalciferol  2,000 Units Oral Daily   hydrocortisone   Topical BID   ketoconazole  1 application Topical Once per day on Mon Thu   metoprolol tartrate  25 mg Oral BID   multivitamin with minerals  1 tablet Oral Daily   pregabalin  25 mg Oral BID   senna  1 tablet Oral QHS   vitamin B-12  2,000 mcg Oral Daily   Continuous Infusions:  heparin 1,350 Units/hr (12/17/20 0840)    Principal Problem:   Acute ischemic stroke ([place]) Active Problems:   Atrial fibrillation (HCC)   Clear cell renal cell carcinoma (HCC)   Neuropathy   LOS: 1 day   A & P  Small subacute ischemic stroke left motor strip: Explains right lower extremity weakness. The patient is chronically on Eliquis for atrial fibrillation. Neuro following. Stroke work-up to be completed with TTE and CTA of head and neck. CTAhead and neck demonstrates no large vessel occlusion or high grade stenosis of the head or neck. TTE is pending.   Thoracolumbar spinal mass with radicular pain and weakness of the  lower extremity. LP to be performed in the next day.  Bilateral lower extremity progressive paresthesias: Likely related to thoracolumbar mass although medication related neuropathy versus GBS remain as possibilities. LP to be performed on 12/18/2020.  Recurrent renal cell carcinoma: Followed by Oncology at [facility], but actually receiving chemo via Oncologist at [place], [place] (Dr. Thedore Kirk). The patient has been in communication with his oncologist who does not feel that the mass on his spine  could be related to RCC.  Chronic atrial fibrillation on Eliquis: Held. The patient has been transitioned to heparin drip for now to facilitate possible LP Friday   Obstructive Sleep apnea: Dependent upon CPAP nightly  I have seen and examined this patient myself. I have spent 34 minutes in his evaluation and care.  Code Status: FULL CODE Family Communication: Spoke with son and patient at length at bedside Status is: Inpatient  Remains inpatient appropriate because:Inpatient level of care appropriate due to severity of illness   Dispo: The patient is from: Home  Anticipated d/c is to: unclear  Anticipated d/c date is: 3 days  Patient currently is not medically stable to d/c.   Evan Colley, DO [facility] Direct contact: see [email or web address]  7PM-7AM contact night coverage as above 12/17/2020, 5:05 PM  LOS: 1 day

## 2020-12-18 ENCOUNTER — Inpatient Hospital Stay (HOSPITAL_COMMUNITY): Payer: Medicare Other

## 2020-12-18 DIAGNOSIS — I639 Cerebral infarction, unspecified: Secondary | ICD-10-CM | POA: Diagnosis not present

## 2020-12-18 DIAGNOSIS — I482 Chronic atrial fibrillation, unspecified: Secondary | ICD-10-CM | POA: Diagnosis not present

## 2020-12-18 LAB — HEPARIN LEVEL (UNFRACTIONATED)
Heparin Unfractionated: 0.32 IU/mL (ref 0.30–0.70)
Heparin Unfractionated: 0.17 IU/mL — ABNORMAL LOW (ref 0.30–0.70)

## 2020-12-18 LAB — APTT: aPTT: 66 seconds — ABNORMAL HIGH (ref 24–36)

## 2020-12-18 NOTE — Progress Notes (Signed)
Inpatient Rehab Admissions:  Inpatient Rehab Consult received.  Attempted to contact patient x2 for rehab assessment, but not able to reach him.  Note pt has [facility] and [facility] Medicare.  Would need to use [facility] Medicare for CIR admit and their prior auth department is closed for the holiday until Monday.  Spoke to [employer], RN CM, who will talk to patient about whether he wants to use VA benefits at another AIR facility [facility]) or if he prefers [facility].  I will f/u with patient on Monday as we could not open insurance before that time.    Signed: Estill Dooms, PT, DPT Admissions Coordinator [phone] 12/18/20  2:42 PM

## 2020-12-18 NOTE — Evaluation (Signed)
Speech Language Pathology Evaluation Patient Details Name: Evan Kirk MRN: [id number] DOB: 11-17-1941 Today's Date: 12/18/2020 Time: 1135-1200 SLP Time Calculation (min) (ACUTE ONLY): 25 min  Problem List:  Patient Active Problem List   Diagnosis Date Noted  . Acute ischemic stroke (HCC) 12/16/2020  . Neuropathy 12/16/2020  . Acute idiopathic gout of right foot 01/04/2019  . Clear cell renal cell carcinoma (HCC) 03/10/2016  . Parkinson disease (HCC) 01/20/2015  . Chronic diastolic heart failure (HCC) 01/16/2014  . HTN (hypertension)   . Hypercholesteremia   . Back pain   . Atrial fibrillation (HCC)   . Obesity   . Diverticulitis of colon (without mention of hemorrhage)(562.11)   . OSA (obstructive sleep apnea)    Past Medical History:  Past Medical History:  Diagnosis Date  . Anticoagulated on Coumadin    [facility]  . Atrial fibrillation (HCC)   . Back pain   . Bladder mass   . BPH (benign prostatic hypertrophy)   . Chronic atrial fibrillation (HCC)   . Diastolic CHF, chronic [facility])    cardiologist-- dr Verdis Prime-- asymptomatic  . Diverticulosis of colon   . Edema   . Essential tremor 06-2015  . History of urinary retention   . HTN (hypertension)   . OSA on CPAP   . Peripheral neuropathy    BILATERAL FEET  . Prediabetes   . PTSD (post-traumatic stress disorder)   . Renal cell carcinoma of left kidney [facility])    Past Surgical History:  Past Surgical History:  Procedure Laterality Date  . CYSTOSCOPY WITH BIOPSY N/A 10/09/2015   Procedure: CYSTOSCOPY WITH BIOPSY;  Surgeon: Malen Gauze, MD;  Location: [facility];  Service: Urology;  Laterality: N/A;  . NEPHRECTOMY Left   . NEPHRECTOMY RADICAL Left 08/13/2015   [facility]-  . TOTAL KNEE ARTHROPLASTY Right 11-10-2008  . TRANSTHORACIC ECHOCARDIOGRAM  08-12-2015      moderate concentric LVH, ef 50-55-%/  trivial MR , PR and TR/  severe LAE and RAE  . VASECTOMY REVERSAL   1978   HPI:  Pt presented to ED with c/o LE weakness, tingling, and burning as well as pain across his back.  MRI of brain showed small acute ischemic stroke at top of Lt motor strip.  Suspect bil. LE numbness and tingling is due to neuropathy, which is chronic, but has worsed due to immunotherapy for CA vs GBS.   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 Pt and son report that Pt is not quite himself in regards to thinking and speech. Pt reports mild working memory deficits prior to admission, however both Pt and son indicate this is worse with this hospitalization. Pt also reports difficulty organizing his thoughts and noted some dysnomia in the past couple of days. Pt typically uses CPAP at night and he did not have this for the past couple of days. Pt was able to communicate complex thoughts to SLP this date and demonstrted adequate recall of functional information, however he continues to report he is not at his baseline. No dysnomia apparent in structured tasks nor in conversation during the evaluation, however Pt with reports. Pt is the primary caregiver for his spouse and he  also reports history of anxiety and PTSD. Recommend f/u SLP services at next venue of care (appropriate for outpatient depending on D/C plans). No further acute SLP services indicated at this time.    SLP Assessment  SLP Recommendation/Assessment: All further Speech Lanaguage Pathology  needs can be addressed in the next venue of care SLP Visit Diagnosis: Cognitive communication deficit (R41.841)    Follow Up Recommendations   (pending PT/OT recommendations)    Frequency and Duration           SLP Evaluation Cognition   Overall Cognitive Status: Impaired/Different from baseline Arousal/Alertness: Awake/alert Orientation Level: Oriented X4 Memory: Impaired Memory Impairment: Decreased recall of new information (4/4 words recalled independently, but Pt reports "struggle with memory") Awareness: Appears intact Problem Solving: Appears intact Safety/Judgment: Appears intact       Comprehension  Auditory Comprehension Overall Auditory Comprehension: Appears within functional limits for tasks assessed Yes/No Questions: Within Functional Limits Commands: Within Functional Limits Conversation: Complex Interfering Components: Anxiety Visual Recognition/Discrimination Discrimination: Within Function Limits Reading Comprehension Reading Status: Not tested    Expression Expression Primary Mode of Expression: Verbal Verbal Expression Overall Verbal Expression: Impaired Initiation: No impairment Automatic Speech: Name;Social Response;Month of year Level of Generative/Spontaneous Verbalization: Conversation Repetition: No impairment Naming: Impairment Responsive: 76-100% accurate Confrontation: Within functional limits Convergent: Not tested Divergent: 75-100% accurate Other Naming Comments: Pt reports difficulty with word finding in conversation Pragmatics: No impairment Non-Verbal Means of Communication: Not applicable Written Expression Dominant Hand: Right Written Expression: Not tested   Oral / Motor  Oral Motor/Sensory Function Overall Oral Motor/Sensory Function: Within functional limits Motor Speech Overall Motor Speech: Appears within functional limits for tasks assessed Respiration: Within functional limits Phonation: Normal Resonance: Within functional limits Articulation: Within functional limitis Intelligibility: Intelligible Motor Planning: Witnin functional limits Motor Speech Errors: Not applicable   Thank you,  Genene Churn, [place]                      [place] 12/18/2020, 12:21 PM

## 2020-12-18 NOTE — Progress Notes (Addendum)
ANTICOAGULATION CONSULT NOTE - Initial Consult  Pharmacy Consult for heparin Indication: atrial fibrillation  Allergies  Allergen Reactions  . Pembrolizumab Other (See Comments)    1) severe neuropathy 2) may or may not be causing GBS as well, this still TBD, see 12/16/2020 admission for further details  . Diltiazem Other (See Comments)    Pt didn't feel well     Patient Measurements: Height: 5\' 6"  (167.6 cm) Weight: 102.1 kg (225 lb) IBW/kg (Calculated) : 63.8 Heparin Dosing Weight: 86.4 kg  Vital Signs: Temp: 98 F (36.7 C) (12/31 0740) Temp Source: Oral (12/31 0740) BP: 124/76 (12/31 0740) Pulse Rate: 63 (12/31 0740)  Labs: Recent Labs    12/15/20 1651 12/17/20 0314 12/17/20 0347 12/17/20 1133 12/18/20 0754  HGB 12.7* 12.5*  --   --   --   HCT 40.6 38.1*  --   --   --   PLT 390 395  --   --   --   APTT  --   --  85* 75* 66*  HEPARINUNFRC  --   --  0.79* 0.62 0.32  CREATININE 1.04 1.21  --   --   --     Estimated Creatinine Clearance: 55.4 mL/min (by C-G formula based on SCr of 1.21 mg/dL).   Medical History: Past Medical History:  Diagnosis Date  . Anticoagulated on Coumadin    [facility]  . Atrial fibrillation (HCC)   . Back pain   . Bladder mass   . BPH (benign prostatic hypertrophy)   . Chronic atrial fibrillation (HCC)   . Diastolic CHF, chronic [facility])    cardiologist-- dr [facility]-- asymptomatic  . Diverticulosis of colon   . Edema   . Essential tremor 06-2015  . History of urinary retention   . HTN (hypertension)   . OSA on CPAP   . Peripheral neuropathy    BILATERAL FEET  . Prediabetes   . PTSD (post-traumatic stress disorder)   . Renal cell carcinoma of left kidney (HCC)     Medications:  See med history  Assessment: 79 yo man to hold eliquis and start heparin for afib.  Last dose eliquis 12/29  Hg 12.5, PTLC 395 Heparin level 0.32 and APTT 66. Correlating  Neurology wanting to do LP today. Dr. 1/30  discussed with pharmacy. Will hold heparin at ~1030 and get heparin level, APTT 3 hours later with goal HL <0.1 for procedure.   Goal of Therapy:  Heparin level 0.3-0.7 units/ml aPTT 66-102 seconds Monitor platelets by anticoagulation protocol: Yes   Plan:  Hold heparin for now, HL, APTT in 3 hours.  F/u heparin restart post-procedure. Once restarted would restart at 1400 units/hr  Check aPTT and heparin level daily  Zanylah Hardie A. Otelia Limes, PharmD, BCPS, FNKF Clinical Pharmacist [place] Please utilize Amion for appropriate phone number to reach the unit pharmacist [facility])   Addendum:  Discussed with Neurology. Patient will not be getting LP, therefore heparin restarted at 1123. Will increase heparin rate to 1400 units/hr as above and get HL in 8 hours.    12/18/2020,10:42 AM

## 2020-12-18 NOTE — Progress Notes (Signed)
Occupational Therapy Treatment Patient Details Name: Evan Kirk MRN: [id number] DOB: Mar 22, 1941 Today's Date: 12/18/2020    History of present illness Pt presented to ED with c/o LE weakness, tingling, and burning as well as pain across his back.  MRI of brain showed small acute ischemic stroke at top of Lt motor strip.  Suspect bil. LE numbness and tingling is due to neuropathy, which is chronic, but has worsed due to immunotherapy for CA vs GBS.   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 Pt thanking staff throughout session for everything being done to help him progress. Pt buckles bil LE. Recommendation for stedy with RN / tech staff for all transfers. Recommendation for CIR for d/c planning.   Follow Up Recommendations  CIR;Supervision/Assistance - 24 hour    Equipment Recommendations  3 in 1 bedside commode    Recommendations for Other Services Rehab consult    Precautions / Restrictions Precautions Precautions: Fall       Mobility Bed Mobility Overal bed mobility: Needs Assistance Bed Mobility: Supine to Sit     Supine to sit: Mod assist     General bed mobility comments: pt requires assistance for LUE movement, RLE movement, and to elevate trunk into sitting  Transfers Overall transfer level: Needs assistance Equipment used: Rolling walker (2 wheeled) Transfers: Sit to/from [employer] Transfers Sit to Stand: +2 physical assistance;Min assist;From elevated surface Stand pivot transfers: Mod assist;+2 physical assistance       General transfer comment: pt with bilateral knee buckling intermittently during transfer, OT assisting with walker management, PT/OT  providing cues for terminal knee extension    Balance Overall balance assessment: Needs assistance Sitting-balance support: Single extremity supported;Feet supported Sitting balance-Leahy Scale: Poor Sitting balance - Comments: reliant on UE support   Standing balance support: Bilateral upper extremity supported Standing balance-Leahy Scale: Poor Standing balance comment: reliant on BUE support of RW                           ADL either performed or assessed with clinical judgement   ADL Overall ADL's : Needs assistance/impaired   Eating/Feeding Details (indicate cue type and reason): setup for meal and MD arriving Grooming: Wash/dry face;Total assistance Grooming Details (indicate cue type and reason): pt sleeping and R eye appears closed with secretions. OT total (A) to help with arousal and opening of eye             Lower Body Dressing: Total assistance   Toilet Transfer: +2 for physical assistance;Moderate assistance Toilet Transfer Details (indicate cue type and reason): pt requires therapist hand at knee to cue for knee extension to keep from buckling. pt requires x2 attempts to simulate chair transfer due to arousal during transfer           General ADL Comments: pt closing eyes with decrease response with first attempt at transfer. Therapist bil knee blockin at EOB to prevent pt from sliding and immediately more aroused due to pain from R knee area.     Vision       Perception     Praxis      Cognition Arousal/Alertness: Awake/alert;Lethargic (lethargic initially)  Behavior During Therapy: WFL for tasks assessed/performed Overall Cognitive Status: Impaired/Different from baseline Area of Impairment: Attention;Memory;Following commands;Awareness;Safety/judgement;Problem solving                   Current Attention Level: Sustained Memory: Decreased recall of precautions Following Commands: Follows one step commands with increased  time Safety/Judgement: Decreased awareness of safety;Decreased awareness of deficits Awareness: Emergent Problem Solving: Slow processing;Decreased initiation;Requires verbal cues;Requires tactile cues;Difficulty sequencing General Comments: Pt able to verbalized R side is weaker        Exercises     Shoulder Instructions       General Comments HR in 50s at rest, a-fib noted on monitor. Pt becomes briefly unresponsive during ambulation and requires assistance to sit on bed. Pt then quickly becomes responsive. BP stable at 132/57, HR remains in 60-70s. Pt expressed some lightheadedness prior to episode, denies these feelings for thr remainder of session    Pertinent Vitals/ Pain       Pain Assessment: Faces Faces Pain Scale: Hurts even more Pain Location: R knee Pain Descriptors / Indicators: Grimacing Pain Intervention(s): Monitored during session;Repositioned  Home Living     Available Help at Discharge: Family Type of Home: House                              Lives With: Spouse    Prior Functioning/Environment              Frequency  Min 2X/week        Progress Toward Goals  OT Goals(current goals can now be found in the care plan section)  Progress towards OT goals: Progressing toward goals  Acute Rehab OT Goals Patient Stated Goal: to be able to do more for myself OT Goal Formulation: With patient/family Time For Goal Achievement: 12/30/20 Potential to Achieve Goals: Good ADL Goals Pt Will Perform Grooming: with min assist;standing Pt Will Perform Upper Body Bathing: with set-up;with supervision;sitting Pt Will Perform Lower Body Bathing: with min guard assist;with adaptive equipment;sit to/from stand Pt Will Perform Upper Body Dressing: with set-up;with supervision;sitting Pt Will Perform Lower Body Dressing: with min assist;with adaptive equipment;sit to/from stand Pt Will Transfer to Toilet: with min assist;ambulating;regular height  toilet;bedside commode;grab bars Pt Will Perform Toileting - Clothing Manipulation and hygiene: with min assist;sit to/from stand  Plan Discharge plan remains appropriate    Co-evaluation    PT/OT/SLP Co-Evaluation/Treatment: Yes Reason for Co-Treatment: Complexity of the patient's impairments (multi-system involvement);Necessary to address cognition/behavior during functional activity;For patient/therapist safety;To address functional/ADL transfers   OT goals addressed during session: ADL's and self-care;Proper use of Adaptive equipment and DME;Strengthening/ROM      AM-PAC OT "6 Clicks" Daily Activity     Outcome Measure   Help from another person eating meals?: None Help from another person taking care of personal grooming?: A Lot Help from another person toileting, which includes using toliet, bedpan, or urinal?: A Lot Help from another person bathing (including washing, rinsing, drying)?: A Lot Help from another person to put on and taking off regular upper body clothing?: A Little Help from another person to put on and taking off regular lower body clothing?: A Lot 6 Click Score: 15    End of Session Equipment Utilized During Treatment: Gait belt;Rolling walker  OT Visit Diagnosis: Unsteadiness on feet (R26.81);Pain   Activity Tolerance Patient tolerated treatment well   Patient Left in chair;with call bell/phone within reach;with chair alarm set;with family/visitor  present   Nurse Communication Mobility status;Precautions        Time: [id number] OT Time Calculation (min): 29 min  Charges: OT General Charges $OT Visit: 1 Visit OT Treatments $Self Care/Home Management : 8-22 mins   Brynn, OTR/L  Acute Rehabilitation Services Pager: [phone] Office: [phone] .    Jeri Modena 12/18/2020, 2:23 PM

## 2020-12-18 NOTE — Progress Notes (Addendum)
Subjective:  "I didn't do as good with PT today. Both my knees buckled." States the symptoms he presented with are no better but no worse. Denies bowel or bladder incontinence (he has urge incontinence due to BPH and has had some leakage if he does not make it to the bathroom in time, but this has not increased nor is it new). No saddle anesthesia. States both his legs have tingling below the knee and the "skin/bone is sore". No pain, numbness, or weakness to LEs above the knee. He states the sx he has are chronic, but the LLE seems worse, but this occurred acutely when he stood to walk from chair and fell before he came to ED.  Endorses double vision with lateral gaze, but this is also chronic.   PT notes: Walked 8 feet yesterday with minimal assistance with walker. Today, both his legs buckled. No official note per PT yet today. Plan is CIR.    Objective: Current vital signs: BP (!) 118/59 (BP Location: Right Arm)   Pulse (!) 50   Temp 98 F (36.7 C) (Oral)   Resp 18   Ht 5\' 6"  (1.676 m)   Wt 102.1 kg   SpO2 96%   BMI 36.32 kg/m  Vital signs in last 24 hours: Temp:  [97.5 F (36.4 C)-98.2 F (36.8 C)] 98 F (36.7 C) (12/31 0740) Pulse Rate:  [47-65] 50 (12/31 0329) Resp:  [14-18] 18 (12/31 0329) BP: (118-130)/(57-69) 118/59 (12/31 0329) SpO2:  [95 %-97 %] 96 % (12/31 0329)  Intake/Output from previous day: 12/30 0701 - 12/31 0700 In: 311.8 [I.V.:311.8] Out: 1950 [Urine:1950] Intake/Output this shift: Total I/O In: -  Out: 500 [Urine:500] Nutritional status:  Diet Order            Diet Heart Room service appropriate? Yes; Fluid consistency: Thin  Diet effective now                 Neurologic Exam:  NEURO:  Mental Status: AA&Ox3 Speech/Language: speech is without dysarthria. Naming, repetition, fluency, and comprehension intact.  Cranial Nerves:  II: PERRL. Visual fields full. Color vision intact. Snellen OD: 20/40     OS: 20/30       OU: 20/25 No double vision on  exam.  III, IV, VI: EOMI. Eyelids elevate symmetrically.  V: Sensation is intact to light touch and symmetrical to face.  VII: Smile is symmetrical. Able to puff cheeks and raise eyebrows.  VIII: hearing intact to voice. IX, X: Palate elevates symmetrically. Phonation is normal.  XI: Shoulder shrug 5/5. XII: tongue is midline without fasciculations. Motor: 5/5 to UEs. Grips 5/5. Hip flexion 2-3/5 on the right and 4/5 on the left. Knee extension is 5/5 bilaterally. Plantar flexion is 5/5 bilat. Ankle dorsiflexion 4+/5 bilaterally.  Tone is normal and bulk is normal. No drift UEs. No drift in LEs but he can not lift the RLE as well as LLE due to pain in back. Exam for this better yesterday when he was supine (states this is easier to perform when supine). C/o back pain with this as well.  Sensation- Intact to light touch to all extremities. No extinction to DSS. Pinprick sensation intact to all 4 extremities but decreased in bilateral feet with right worse than left. Vibratory sense is absent to bilateral feet and improves more proxminally.   Coordination: No ataxia noted.  DTRs: 2+ BUE brachioradialis and right biceps. 0 left biceps. 2+ bilateral triceps. 0 bilateral patellae and achilles  Gait: Deferred.  Lab Results: Results for orders placed or performed during the hospital encounter of 12/15/20 (from the past 48 hour(s))  Vitamin B12     Status: Abnormal   Collection Time: 12/16/20 10:43 AM  Result Value Ref Range   Vitamin B-12 1,048 (H) 180 - 914 pg/mL    Comment: SLIGHT HEMOLYSIS (NOTE) This assay is not validated for testing neonatal or myeloproliferative syndrome specimens for Vitamin B12 levels. Performed at [facility], [address] [place], [place] [address]   Folate     Status: None   Collection Time: 12/16/20 10:43 AM  Result Value Ref Range   Folate 12.5 >5.9 ng/mL    Comment: Performed at [facility], [address], [place], [place] [address]  TSH      Status: None   Collection Time: 12/16/20 10:43 AM  Result Value Ref Range   TSH 1.535 0.350 - 4.500 uIU/mL    Comment: Performed by a 3rd Generation assay with a functional sensitivity of <=0.01 uIU/mL. Performed at [facility], [address] [place], [place] [address]   Comprehensive metabolic panel     Status: Abnormal   Collection Time: 12/17/20  3:14 AM  Result Value Ref Range   Sodium 134 (L) 135 - 145 mmol/L   Potassium 4.1 3.5 - 5.1 mmol/L   Chloride 103 98 - 111 mmol/L   CO2 23 22 - 32 mmol/L   Glucose, Bld 113 (H) 70 - 99 mg/dL    Comment: Glucose reference range applies only to samples taken after fasting for at least 8 hours.   BUN 16 8 - 23 mg/dL   Creatinine, Ser 9.41 0.61 - 1.24 mg/dL   Calcium 74.0 8.9 - 81.4 mg/dL   Total Protein 7.1 6.5 - 8.1 g/dL   Albumin 3.0 (L) 3.5 - 5.0 g/dL   AST 12 (L) 15 - 41 U/L   ALT 10 0 - 44 U/L   Alkaline Phosphatase 54 38 - 126 U/L   Total Bilirubin 0.5 0.3 - 1.2 mg/dL   GFR, Estimated >48 >18 mL/min    Comment: (NOTE) Calculated using the CKD-EPI Creatinine Equation (2021)    Anion gap 8 5 - 15    Comment: Performed at [facility], [address], [place], [place] [address]  CBC     Status: Abnormal   Collection Time: 12/17/20  3:14 AM  Result Value Ref Range   WBC 8.1 4.0 - 10.5 K/uL   RBC 4.24 4.22 - 5.81 MIL/uL   Hemoglobin 12.5 (L) 13.0 - 17.0 g/dL   HCT 97.0 (L) 26.3 - 78.5 %   MCV 89.9 80.0 - 100.0 fL   MCH 29.5 26.0 - 34.0 pg   MCHC 32.8 30.0 - 36.0 g/dL   RDW 88.5 02.7 - 74.1 %   Platelets 395 150 - 400 K/uL   nRBC 0.0 0.0 - 0.2 %    Comment: Performed at [facility], [address], [place], [place] [address]  Magnesium     Status: None   Collection Time: 12/17/20  3:14 AM  Result Value Ref Range   Magnesium 2.0 1.7 - 2.4 mg/dL    Comment: Performed at [facility], [address] [place], [place] [address]  Heparin level (unfractionated)     Status: Abnormal   Collection  Time: 12/17/20  3:47 AM  Result Value Ref Range   Heparin Unfractionated 0.79 (H) 0.30 - 0.70 IU/mL  Comment: (NOTE) If heparin results are below expected values, and patient dosage has  been confirmed, suggest follow up testing of antithrombin III levels. Performed at [facility], [place] [address], [place], [place] [address]   APTT     Status: Abnormal   Collection Time: 12/17/20  3:47 AM  Result Value Ref Range   aPTT 85 (H) 24 - 36 seconds    Comment:        IF BASELINE aPTT IS ELEVATED, SUGGEST PATIENT RISK ASSESSMENT BE USED TO DETERMINE APPROPRIATE ANTICOAGULANT THERAPY. Performed at [facility], [place] [address], [place], [place] [address]   Heparin level (unfractionated)     Status: None   Collection Time: 12/17/20 11:33 AM  Result Value Ref Range   Heparin Unfractionated 0.62 0.30 - 0.70 IU/mL    Comment: (NOTE) If heparin results are below expected values, and patient dosage has  been confirmed, suggest follow up testing of antithrombin III levels. Performed at [facility], [place] [address], [place], [place] [address]   APTT     Status: Abnormal   Collection Time: 12/17/20 11:33 AM  Result Value Ref Range   aPTT 75 (H) 24 - 36 seconds    Comment:        IF BASELINE aPTT IS ELEVATED, SUGGEST PATIENT RISK ASSESSMENT BE USED TO DETERMINE APPROPRIATE ANTICOAGULANT THERAPY. Performed at [facility], [place] [address] [place], [place] [address]     Recent Results (from the past 240 hour(s))  Resp Panel by RT-PCR (Flu A&B, Covid) Nasopharyngeal Swab     Status: None   Collection Time: 12/15/20 10:30 PM   Specimen: Nasopharyngeal Swab; Nasopharyngeal(NP) swabs in vial transport medium  Result Value Ref Range Status   SARS Coronavirus 2 by RT PCR NEGATIVE NEGATIVE Final    Comment: (NOTE) SARS-CoV-2 target nucleic acids are NOT DETECTED.  The SARS-CoV-2 RNA is generally detectable in upper respiratory specimens during the acute phase of  infection. The lowest concentration of SARS-CoV-2 viral copies this assay can detect is 138 copies/mL. A negative result does not preclude SARS-Cov-2 infection and should not be used as the sole basis for treatment or other patient management decisions. A negative result may occur with  improper specimen collection/handling, submission of specimen other than nasopharyngeal swab, presence of viral mutation(s) within the areas targeted by this assay, and inadequate number of viral copies(<138 copies/mL). A negative result must be combined with clinical observations, patient history, and epidemiological information. The expected result is Negative.  Fact Sheet for Patients:  EntrepreneurPulse.com.au  Fact Sheet for Healthcare Providers:  IncredibleEmployment.be  This test is no t yet approved or cleared by the [place] FDA and  has been authorized for detection and/or diagnosis of SARS-CoV-2 by FDA under an Emergency Use Authorization (EUA). This EUA will remain  in effect (meaning this test can be used) for the duration of the COVID-19 declaration under Section 564(b)(1) of the Act, 21 U.S.C.section 360bbb-3(b)(1), unless the authorization is terminated  or revoked sooner.       Influenza A by PCR NEGATIVE NEGATIVE Final   Influenza B by PCR NEGATIVE NEGATIVE Final    Comment: (NOTE) The Xpert Xpress SARS-CoV-2/FLU/RSV plus assay is intended as an aid in the diagnosis of influenza from Nasopharyngeal swab specimens and should not be used as a sole basis for treatment. Nasal washings and aspirates are unacceptable for Xpert Xpress SARS-CoV-2/FLU/RSV testing.  Fact Sheet for Patients: EntrepreneurPulse.com.au  Fact Sheet for  Healthcare Providers: SeriousBroker.it  This test is not yet approved or cleared by the [place] and has been authorized for detection and/or diagnosis of SARS-CoV-2  by FDA under an Emergency Use Authorization (EUA). This EUA will remain in effect (meaning this test can be used) for the duration of the COVID-19 declaration under Section 564(b)(1) of the Act, 21 U.S.C. section 360bbb-3(b)(1), unless the authorization is terminated or revoked.  Performed at [facility], [address] [place], [place] [address]     Lipid Panel Recent Labs    12/16/20 0419  CHOL 140  TRIG 77  HDL 33*  CHOLHDL 4.2  VLDL 15  LDLCALC 92    Studies/Results: CT ANGIO HEAD W OR WO CONTRAST  Result Date: 12/16/2020 CLINICAL DATA:  Weakness EXAM: CT ANGIOGRAPHY HEAD AND NECK TECHNIQUE: Multidetector CT imaging of the head and neck was performed using the standard protocol during bolus administration of intravenous contrast. Multiplanar CT image reconstructions and MIPs were obtained to evaluate the vascular anatomy. Carotid stenosis measurements (when applicable) are obtained utilizing NASCET criteria, using the distal internal carotid diameter as the denominator. CONTRAST:  52mL OMNIPAQUE IOHEXOL 350 MG/ML SOLN COMPARISON:  Head CT 12/15/2020 FINDINGS: CT HEAD FINDINGS Brain: There is no mass, hemorrhage or extra-axial collection. There is generalized atrophy without lobar predilection. There is no acute or chronic infarction. The brain parenchyma is normal. Skull: The visualized skull base, calvarium and extracranial soft tissues are normal. Sinuses/Orbits: No fluid levels or advanced mucosal thickening of the visualized paranasal sinuses. No mastoid or middle ear effusion. The orbits are normal. CTA NECK FINDINGS SKELETON: There is no bony spinal canal stenosis. No lytic or blastic lesion. OTHER NECK: Normal pharynx, larynx and major salivary glands. No cervical lymphadenopathy. Unremarkable thyroid gland. UPPER CHEST: No pneumothorax or pleural effusion. No nodules or masses. AORTIC ARCH: There is calcific atherosclerosis of the aortic arch. There is no aneurysm,  dissection or hemodynamically significant stenosis of the visualized portion of the aorta. Conventional 3 vessel aortic branching pattern. The visualized proximal subclavian arteries are widely patent. RIGHT CAROTID SYSTEM: No dissection, occlusion or aneurysm. Mild atherosclerotic calcification at the carotid bifurcation without hemodynamically significant stenosis. LEFT CAROTID SYSTEM: No dissection, occlusion or aneurysm. Mild atherosclerotic calcification at the carotid bifurcation without hemodynamically significant stenosis. VERTEBRAL ARTERIES: Left dominant configuration. Both origins are clearly patent. There is no dissection, occlusion or flow-limiting stenosis to the skull base (V1-V3 segments). CTA HEAD FINDINGS POSTERIOR CIRCULATION: --Vertebral arteries: Normal V4 segments. --Inferior cerebellar arteries: Normal. --Basilar artery: Normal. --Superior cerebellar arteries: Normal. --Posterior cerebral arteries (PCA): Normal. ANTERIOR CIRCULATION: --Intracranial internal carotid arteries: Normal. --Anterior cerebral arteries (ACA): Normal. Both A1 segments are present. Patent anterior communicating artery (a-comm). --Middle cerebral arteries (MCA): Normal. VENOUS SINUSES: As permitted by contrast timing, patent. ANATOMIC VARIANTS: None Review of the MIP images confirms the above findings. IMPRESSION: 1. No emergent large vessel occlusion or high-grade stenosis of the head or neck. Aortic Atherosclerosis (ICD10-I70.0). Electronically Signed   By: Deatra Robinson M.D.   On: 12/16/2020 21:21   CT ANGIO NECK W OR WO CONTRAST  Result Date: 12/16/2020 CLINICAL DATA:  Weakness EXAM: CT ANGIOGRAPHY HEAD AND NECK TECHNIQUE: Multidetector CT imaging of the head and neck was performed using the standard protocol during bolus administration of intravenous contrast. Multiplanar CT image reconstructions and MIPs were obtained to evaluate the vascular anatomy. Carotid stenosis measurements (when applicable) are  obtained utilizing NASCET criteria, using the distal internal carotid diameter as  the denominator. CONTRAST:  18mL OMNIPAQUE IOHEXOL 350 MG/ML SOLN COMPARISON:  Head CT 12/15/2020 FINDINGS: CT HEAD FINDINGS Brain: There is no mass, hemorrhage or extra-axial collection. There is generalized atrophy without lobar predilection. There is no acute or chronic infarction. The brain parenchyma is normal. Skull: The visualized skull base, calvarium and extracranial soft tissues are normal. Sinuses/Orbits: No fluid levels or advanced mucosal thickening of the visualized paranasal sinuses. No mastoid or middle ear effusion. The orbits are normal. CTA NECK FINDINGS SKELETON: There is no bony spinal canal stenosis. No lytic or blastic lesion. OTHER NECK: Normal pharynx, larynx and major salivary glands. No cervical lymphadenopathy. Unremarkable thyroid gland. UPPER CHEST: No pneumothorax or pleural effusion. No nodules or masses. AORTIC ARCH: There is calcific atherosclerosis of the aortic arch. There is no aneurysm, dissection or hemodynamically significant stenosis of the visualized portion of the aorta. Conventional 3 vessel aortic branching pattern. The visualized proximal subclavian arteries are widely patent. RIGHT CAROTID SYSTEM: No dissection, occlusion or aneurysm. Mild atherosclerotic calcification at the carotid bifurcation without hemodynamically significant stenosis. LEFT CAROTID SYSTEM: No dissection, occlusion or aneurysm. Mild atherosclerotic calcification at the carotid bifurcation without hemodynamically significant stenosis. VERTEBRAL ARTERIES: Left dominant configuration. Both origins are clearly patent. There is no dissection, occlusion or flow-limiting stenosis to the skull base (V1-V3 segments). CTA HEAD FINDINGS POSTERIOR CIRCULATION: --Vertebral arteries: Normal V4 segments. --Inferior cerebellar arteries: Normal. --Basilar artery: Normal. --Superior cerebellar arteries: Normal. --Posterior cerebral  arteries (PCA): Normal. ANTERIOR CIRCULATION: --Intracranial internal carotid arteries: Normal. --Anterior cerebral arteries (ACA): Normal. Both A1 segments are present. Patent anterior communicating artery (a-comm). --Middle cerebral arteries (MCA): Normal. VENOUS SINUSES: As permitted by contrast timing, patent. ANATOMIC VARIANTS: None Review of the MIP images confirms the above findings. IMPRESSION: 1. No emergent large vessel occlusion or high-grade stenosis of the head or neck. Aortic Atherosclerosis (ICD10-I70.0). Electronically Signed   By: Ulyses Jarred M.D.   On: 12/16/2020 21:21   ECHOCARDIOGRAM COMPLETE BUBBLE STUDY  Result Date: 12/16/2020    ECHOCARDIOGRAM REPORT   Patient Name:   CHERIF VEREB Date of Exam: 12/16/2020 Medical Rec #:  [id number]         Height:       66.0 in Accession #:    [id number]        Weight:       225.0 lb Date of Birth:  04/02/1941         BSA:          2.102 m Patient Age:    79 years          BP:           116/65 mmHg Patient Gender: M                 HR:           73 bpm. Exam Location:  Inpatient Procedure: 2D Echo and Saline Contrast Bubble Study Indications:    Stroke  History:        Patient has prior history of Echocardiogram examinations, most                 recent 08/12/2015. CHF, Arrythmias:Atrial Fibrillation; Risk                 Factors:Hypertension.  Sonographer:    Mikki Santee RDCS (AE) Referring Phys: [place]  1. Left ventricular ejection fraction, by estimation, is 60 to 65%. The left ventricle has normal function. The left ventricle has  no regional wall motion abnormalities. There is mild left ventricular hypertrophy. Left ventricular diastolic parameters are indeterminate.  2. Right ventricular systolic function is mildly reduced. The right ventricular size is mildly enlarged.  3. Left atrial size was severely dilated.  4. The mitral valve is abnormal. Trivial mitral valve regurgitation. No evidence of mitral stenosis.  Moderate mitral annular calcification.  5. The aortic valve is tricuspid. There is moderate calcification of the aortic valve. Aortic valve regurgitation is not visualized. Mild aortic valve stenosis.  6. Aortic dilatation noted. There is dilatation of the ascending aorta, measuring 44 mm.  7. Agitated saline contrast bubble study was negative, with no evidence of any interatrial shunt. FINDINGS  Left Ventricle: Left ventricular ejection fraction, by estimation, is 60 to 65%. The left ventricle has normal function. The left ventricle has no regional wall motion abnormalities. The left ventricular internal cavity size was normal in size. There is  mild left ventricular hypertrophy. Left ventricular diastolic parameters are indeterminate. Right Ventricle: The right ventricular size is mildly enlarged. Right vetricular wall thickness was not well visualized. Right ventricular systolic function is mildly reduced. Left Atrium: Left atrial size was severely dilated. Right Atrium: Right atrial size was normal in size. Pericardium: There is no evidence of pericardial effusion. Mitral Valve: The mitral valve is abnormal. Moderate mitral annular calcification. Trivial mitral valve regurgitation. No evidence of mitral valve stenosis. Tricuspid Valve: The tricuspid valve is normal in structure. Tricuspid valve regurgitation is trivial. Aortic Valve: The aortic valve is tricuspid. There is moderate calcification of the aortic valve. Aortic valve regurgitation is not visualized. Mild aortic stenosis is present. Aortic valve mean gradient measures 8.2 mmHg. Aortic valve peak gradient measures 18.6 mmHg. Aortic valve area, by VTI measures 1.73 cm. Pulmonic Valve: The pulmonic valve was not well visualized. Pulmonic valve regurgitation is not visualized. Aorta: The aortic root is normal in size and structure and aortic dilatation noted. There is dilatation of the ascending aorta, measuring 44 mm. Venous: The inferior vena cava was  not well visualized. IAS/Shunts: The interatrial septum was not well visualized. Agitated saline contrast was given intravenously to evaluate for intracardiac shunting. Agitated saline contrast bubble study was negative, with no evidence of any interatrial shunt.  LEFT VENTRICLE PLAX 2D LVIDd:         3.80 cm  Diastology LVIDs:         2.70 cm  LV e' medial:   9.36 cm/s LV PW:         1.30 cm  LV E/e' medial: 0.9 LV IVS:        1.30 cm LVOT diam:     2.50 cm LV SV:         77 LV SV Index:   36 LVOT Area:     4.91 cm  RIGHT VENTRICLE RV S prime:     10.20 cm/s TAPSE (M-mode): 1.1 cm LEFT ATRIUM              Index       RIGHT ATRIUM           Index LA diam:        6.20 cm  2.95 cm/m  RA Area:     16.50 cm LA Vol (A2C):   114.0 ml 54.22 ml/m RA Volume:   37.60 ml  17.88 ml/m LA Vol (A4C):   126.0 ml 59.93 ml/m LA Biplane Vol: 120.0 ml 57.08 ml/m  AORTIC VALVE AV Area (Vmax):    1.62  cm AV Area (Vmean):   1.80 cm AV Area (VTI):     1.73 cm AV Vmax:           215.80 cm/s AV Vmean:          132.200 cm/s AV VTI:            0.443 m AV Peak Grad:      18.6 mmHg AV Mean Grad:      8.2 mmHg LVOT Vmax:         71.10 cm/s LVOT Vmean:        48.400 cm/s LVOT VTI:          0.156 m LVOT/AV VTI ratio: 0.35  AORTA Ao Root diam: 3.70 cm MV E velocity: 8.49 cm/s  TRICUSPID VALVE                           TR Peak grad:   21.2 mmHg                           TR Vmax:        230.00 cm/s                            SHUNTS                           Systemic VTI:  0.16 m                           Systemic Diam: 2.50 cm Oswaldo Milian MD Electronically signed by Oswaldo Milian MD Signature Date/Time: 12/16/2020/11:43:55 AM    Final     Medications:  Scheduled: . cholecalciferol  2,000 Units Oral Daily  . hydrocortisone   Topical BID  . ketoconazole  1 application Topical Once per day on Mon Thu  . metoprolol tartrate  25 mg Oral BID  . multivitamin with minerals  1 tablet Oral Daily  . pregabalin  25 mg Oral BID   . senna  1 tablet Oral QHS  . vitamin B-12  2,000 mcg Oral Daily   Continuous: . 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 Has had waxing and waning strength this admission with no consistent worsening since he presented to the hospital.  - On IV heparin as temporary replacement for DOAC in anticipation of fluoro-guided LP. However, based on today's exam findings with preserved upper extremity reflexes and weakness of BLE that is mostly restricted to the hip flexors, GBS is now felt to be unlikely and we do not think the LP will reveal anything that would change our tx plan. The risk of stopping his Heparin given his AF and recent stroke outweigh the benefits of what the LP will yield. This is more likely a multifocal mononeuropathy versus multifactorial weakness with chronic peripheral neuropathy, deconditioning and pain likely playing a significant role. We recommend outpt f/up with [facility] for an EMG/NCS. Will continue IV Heparin until pt seen tomorrow in case his condition changes - if not, we most likely will switch back to DOAC on 12/19/20.   - BLE strength with patchy deficits but without progressive worsening in strength since admission. Absent BLE reflexes are most likely secondary to his  chronic neuropathy. Given the abrupt onset rather than rapidly progressive nature of presenting symptoms, doubt this is GBS. Also, his exam is not consistent with spinal cord stroke as patients generally present with complete bilateral paraplegia with spinal cord infarction.  - Heparin level this AM is 0.32 with PTT at 66. Continue Heparin IV until tomorrow to see if patient's exam is changing. Likely able to restart DOAC tomorrow. LP cancelled for today.   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 Per July 2021 Oncology note,  plan at that time was for follow up in 3 months towards the goal of pursuing additional treatment for the cancer recurrence - Primary team has decided against xfer to [facility] due to patient also receiving much of his outpatient care here in [place].  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 Stable on exam.  - Stroke work up completed. No indications for changing treatment from anticoagulation in the setting of his atrial fibrillation, based upon results of stroke work up.    LOS: 2 days    Pt seen by Clance Boll, NP and later with the MD.   @Electronically  signed: Dr. Kerney Elbe 12/18/2020  7:56 AM

## 2020-12-18 NOTE — Progress Notes (Signed)
Physical Therapy Treatment Patient Details Name: Evan Kirk MRN: [id number] DOB: [birth year] Today's Date: 12/18/2020    History of Present Illness Pt presented to ED with c/o LE weakness, tingling, and burning as well as pain across his back.  MRI of brain showed small acute ischemic stroke at top of Lt motor strip.  Suspect bil. LE numbness and tingling is due to neuropathy, which is chronic, but has worsed due to immunotherapy for CA vs GBS.   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 Pt continues to demonstrate significant BLE weakness with knee buckling bilaterally with attempts at transfers or gait training. Pt also with :UE weakness noted during bed mobility, unable to bring L arm across body to roll. Pt becomes briefly unresponsive during gait but quickly returns to his previous baseline when sitting, BP stable, pt noted to be in AFIB throughout session. Pt will continue to benefit from acute PT POC to improve LE strength and to reduce falls risk. PT continues to recommend CIR at this time.   Follow Up Recommendations  CIR     Equipment Recommendations  Wheelchair cushion (measurements PT);Wheelchair (measurements PT)    Recommendations for Other Services       Precautions / Restrictions Precautions Precautions: Fall Restrictions Weight Bearing Restrictions: No    Mobility  Bed Mobility Overal bed mobility: Needs Assistance Bed Mobility: Supine to Sit     Supine to sit: Mod assist     General bed mobility comments: pt requires assistance for LUE movement, RLE movement, and to elevate trunk into sitting  Transfers Overall transfer level: Needs assistance Equipment used: Rolling walker (2 wheeled) Transfers:  Sit to/from [employer] Transfers Sit to Stand: +2 physical assistance;Min assist;From elevated surface Stand pivot transfers: Mod assist;+2 physical assistance       General transfer comment: pt with bilateral knee buckling intermittently during transfer, OT assisting with walker management, PT/OT providing cues for terminal knee extension  Ambulation/Gait Ambulation/Gait assistance: Mod assist Gait Distance (Feet): 2 Feet Assistive device: Rolling walker (2 wheeled) Gait Pattern/deviations: Step-to pattern Gait velocity: reduced Gait velocity interpretation: <1.31 ft/sec, indicative of household ambulator General Gait Details: pt with short step-to gait, requiring assistance for weight shift and gait sequencing as well as PT physical assist knee block   Stairs             Wheelchair Mobility    Modified Rankin (Stroke Patients Only) Modified Rankin (Stroke Patients Only) Pre-Morbid Rankin Score: No significant disability Modified Rankin: Moderately severe disability     Balance Overall balance assessment: Needs assistance Sitting-balance support: Single extremity supported;Feet supported Sitting balance-Leahy Scale: Poor Sitting balance - Comments: reliant on UE support   Standing balance support: Bilateral upper extremity supported Standing balance-Leahy Scale: Poor Standing balance comment: reliant on BUE support of RW                            Cognition Arousal/Alertness: Awake/alert;Lethargic (lethargic initially) Behavior During Therapy: WFL for tasks assessed/performed Overall Cognitive Status: Impaired/Different from baseline Area of Impairment: Attention;Memory;Following commands;Awareness;Safety/judgement;Problem solving                   Current Attention Level: Sustained Memory: Decreased recall  of precautions Following Commands: Follows one step commands with increased time Safety/Judgement: Decreased awareness of  safety;Decreased awareness of deficits Awareness: Emergent Problem Solving: Slow processing;Decreased initiation;Requires verbal cues;Requires tactile cues;Difficulty sequencing        Exercises      General Comments General comments (skin integrity, edema, etc.): HR in 50s at rest, a-fib noted on monitor. Pt becomes briefly unresponsive during ambulation and requires assistance to sit on bed. Pt then quickly becomes responsive. BP stable at 132/57, HR remains in 60-70s. Pt expressed some lightheadedness prior to episode, denies these feelings for thr remainder of session      Pertinent Vitals/Pain Pain Assessment: Faces Faces Pain Scale: Hurts even more Pain Location: R knee Pain Descriptors / Indicators: Grimacing Pain Intervention(s): Monitored during session    Home Living                      Prior Function            PT Goals (current goals can now be found in the care plan section) Acute Rehab PT Goals Patient Stated Goal: to be able to take care of self Progress towards PT goals: Not progressing toward goals - comment (limited by medical complications, episode of reduced responsiveness)    Frequency    Min 4X/week      PT Plan Current plan remains appropriate    Co-evaluation PT/OT/SLP Co-Evaluation/Treatment: Yes Reason for Co-Treatment: Complexity of the patient's impairments (multi-system involvement);Necessary to address cognition/behavior during functional activity;For patient/therapist safety;To address functional/ADL transfers PT goals addressed during session: Mobility/safety with mobility;Balance;Proper use of DME;Strengthening/ROM        AM-PAC PT "6 Clicks" Mobility   Outcome Measure  Help needed turning from your back to your side while in a flat bed without using bedrails?: A Lot Help needed moving from lying on your back to sitting on the side of a flat bed without using bedrails?: A Lot Help needed moving to and from a bed to a  chair (including a wheelchair)?: A Lot Help needed standing up from a chair using your arms (e.g., wheelchair or bedside chair)?: A Lot Help needed to walk in hospital room?: A Lot Help needed climbing 3-5 steps with a railing? : Total 6 Click Score: 11    End of Session Equipment Utilized During Treatment: Gait belt Activity Tolerance: Treatment limited secondary to medical complications (Comment) Patient left: in chair;with call bell/phone within reach;with chair alarm set Nurse Communication: Mobility status PT Visit Diagnosis: Unsteadiness on feet (R26.81);Muscle weakness (generalized) (M62.81)     Time: [id number] PT Time Calculation (min) (ACUTE ONLY): 27 min  Charges:  $Therapeutic Activity: 8-22 mins                     Zenaida Niece, PT, DPT Acute Rehabilitation Pager: [phone]    Zenaida Niece 12/18/2020, 11:26 AM

## 2020-12-18 NOTE — Progress Notes (Signed)
PROGRESS NOTE  Evan Kirk:[id number] DOB: [birth year] DOA: 12/15/2020 PCP: Evan Floro, MD  Brief History   79 year old 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  He is recently started immunotherapy at [facility] using pembrolizumab/axitinib.  He presented to the ED 12/29 with leg weakness tingling and burning for 48 hours with the right leg greater than left leg weakness as well.  In the ED CT head was unremarkable. CT thoracic lumbar spine showed mass as described below. MRI brain shows a punctate acute infarct in the superior left frontal hemisphere, close to the motor strip. No bleeding. No mass-effect. MRI of the thoracic and lumbar spine with no evidence of spinal cord infarct.  Consultants  . PCCM . Neurology . Rehab medicine  Procedures  . None  Antibiotics   Anti-infectives (From admission, onward)   None     Subjective  The patient is resting comfortably. He states that he is not improved, but does not seem worse. Son is at bedside.  Objective   Vitals:  Vitals:   12/18/20 1201 12/18/20 1557  BP: 113/76 109/63  Pulse: 90 68  Resp:  16  Temp:  98 F (36.7 C)  SpO2:  96%   Exam:  Constitutional:  . The patient is awake, alert, and oriented x 3. No acute distress. Respiratory:  . No increased work of breathing. . No wheezes, rales, or rhonchi . No tactile fremitus Cardiovascular:  . Regular rate and rhythm . No murmurs, ectopy, or gallups. . No lateral PMI. No thrills. Abdomen:  . Abdomen is soft, non-tender, non-distended . No hernias, masses, or organomegaly . Normoactive bowel sounds.  Musculoskeletal:  . No cyanosis, clubbing, or edema Skin:  . No rashes, lesions, ulcers . palpation of skin: no induration or nodules Neurologic:  . CN 2-12 intact . Sensation all 4 extremities intact . Weakness of  lower extremities bilaterally. Left greater than right. Psychiatric:  . Mental status o Mood, affect appropriate o Orientation to person, place, time  . judgment and insight appear intact I have personally reviewed the following:   Today's Data  . Vitals  Imaging  . CTA head and neck.  Cardiology Data  . Echocardiogram  Scheduled Meds: . cholecalciferol  2,000 Units Oral Daily  . hydrocortisone   Topical BID  . ketoconazole  1 application Topical Once per day on Mon Thu  . metoprolol tartrate  25 mg Oral BID  . multivitamin with minerals  1 tablet Oral Daily  . pregabalin  25 mg Oral BID  . senna  1 tablet Oral QHS  . vitamin B-12  2,000 mcg Oral Daily   Continuous Infusions: . heparin 1,400 Units/hr (12/18/20 1323)    Principal Problem:   Acute ischemic stroke (HCC) Active Problems:   Atrial fibrillation (HCC)   Clear cell renal cell carcinoma (HCC)   Neuropathy   LOS: 2 days   A & P  Small subacute ischemic stroke left motor strip: Explains right lower extremity weakness. The patient is chronically on Eliquis for atrial fibrillation. Neuro following. Stroke work-up to be completed with TTE and CTA of head and neck. CTAhead and neck demonstrates no large vessel occlusion or high grade stenosis of the head or neck. Echocardiogram with Bubble study is pending.  Thoracolumbar spinal mass with radicular pain and weakness of the lower extremity. What role this plays  in the patient's presenting symptoms is unknown.   Bilateral lower extremity progressive paresthesias: DDx: Due to CVA, throacolumbar mass, adverse reaction of medication.  Recurrent renal cell carcinoma: Followed by Oncology at [facility], but actually receiving chemo via Oncologist at [place], [place] (Dr. Candiss Kirk). The patient has been in communication with his oncologist who does not feel that the mass on his spine could be related to [place]. Phone number for Oncologist on call at the [place] ( Dr. Mervin Kirk  [phone].  Chronic atrial fibrillation on Eliquis: Held. The patient has been transitioned to heparin drip.  Obstructive Sleep apnea: Dependent upon CPAP nightly  I have seen and examined this patient myself. I have spent 36 minutes in his evaluation and care.  Code Status: FULL CODE Family Communication: Spoke with son and patient at bedside Status is: Inpatient  Remains inpatient appropriate because:Inpatient level of care appropriate due to severity of illness  Dispo: The patient is from: Home  Anticipated d/c is to: unclear  Anticipated d/c date is: 3 days  Patient currently is not medically stable to d/c.  Evan Wynn, DO [facility] Direct contact: see [email or web address]  7PM-7AM contact night coverage as above 12/18/2020, 4:41 PM  LOS: 1 day

## 2020-12-18 NOTE — Plan of Care (Signed)
  Problem: Education: Goal: Knowledge of disease or condition will improve Outcome: Progressing Goal: Knowledge of secondary prevention will improve Outcome: Progressing Goal: Knowledge of patient specific risk factors addressed and post discharge goals established will improve Outcome: Progressing   Problem: Coping: Goal: Will verbalize positive feelings about self Outcome: Progressing Goal: Will identify appropriate support needs Outcome: Progressing   Problem: Self-Care: Goal: Ability to participate in self-care as condition permits will improve Outcome: Progressing   Problem: Nutrition: Goal: Risk of aspiration will decrease Outcome: Progressing   Problem: Ischemic Stroke/TIA Tissue Perfusion: Goal: Complications of ischemic stroke/TIA will be minimized Outcome: Progressing   

## 2020-12-18 NOTE — Progress Notes (Signed)
ANTICOAGULATION CONSULT NOTE  Pharmacy Consult for heparin Indication: atrial fibrillation  Allergies  Allergen Reactions  . Pembrolizumab Other (See Comments)    1) severe neuropathy 2) may or may not be causing GBS as well, this still TBD, see 12/16/2020 admission for further details  . Diltiazem Other (See Comments)    Pt didn't feel well     Patient Measurements: Height: 5\' 6"  (167.6 cm) Weight: 102.1 kg (225 lb) IBW/kg (Calculated) : 63.8 Heparin Dosing Weight: 86.4 kg  Vital Signs: Temp: 98 F (36.7 C) (12/31 2051) Temp Source: Oral (12/31 2051) BP: 124/77 (12/31 2113) Pulse Rate: 74 (12/31 2113)  Labs: Recent Labs    12/17/20 0314 12/17/20 0347 12/17/20 0347 12/17/20 1133 12/18/20 0754 12/18/20 2139  HGB 12.5*  --   --   --   --   --   HCT 38.1*  --   --   --   --   --   PLT 395  --   --   --   --   --   APTT  --  85*  --  75* 66*  --   HEPARINUNFRC  --  0.79*   < > 0.62 0.32 0.17*  CREATININE 1.21  --   --   --   --   --    < > = values in this interval not displayed.    Estimated Creatinine Clearance: 55.4 mL/min (by C-G formula based on SCr of 1.21 mg/dL).   Medical History: Past Medical History:  Diagnosis Date  . Anticoagulated on Coumadin    follow[facility] by [facility]  . Atrial fibrillation (HCC)   . Back pain   . Bladder mass   . BPH (benign prostatic hypertrophy)   . Chronic atrial fibrillation (HCC)   . Diastolic CHF, chronic [facility])    cardiologist-- dr [facility]-- asymptomatic  . Diverticulosis of colon   . Edema   . Essential tremor 06-2015  . History of urinary retention   . HTN (hypertension)   . OSA on CPAP   . Peripheral neuropathy    BILATERAL FEET  . Prediabetes   . PTSD (post-traumatic stress disorder)   . Renal cell carcinoma of left kidney (HCC)     Medications:  See med history  Assessment: 79 yo man to hold eliquis and start heparin for afib.  Last dose eliquis 12/29. Heparin held for possible LP then  resumed. Heparin level 0.17 after restarting.  Goal of Therapy:  Heparin level 0.3-0.7 units/ml aPTT 66-102 seconds Monitor platelets by anticoagulation protocol: Yes   Plan:  Increase heparin to 1600 units/h Recheck heparin level with daily labs   1/30, PharmD, BCPS, [facility] Clinical Pharmacist [phone] Please check AMION for all [facility] Pharmacy numbers 12/18/2020

## 2020-12-19 ENCOUNTER — Inpatient Hospital Stay (HOSPITAL_COMMUNITY): Payer: Medicare Other

## 2020-12-19 DIAGNOSIS — I482 Chronic atrial fibrillation, unspecified: Secondary | ICD-10-CM | POA: Diagnosis not present

## 2020-12-19 DIAGNOSIS — I639 Cerebral infarction, unspecified: Secondary | ICD-10-CM | POA: Diagnosis not present

## 2020-12-19 LAB — HEPARIN LEVEL (UNFRACTIONATED): Heparin Unfractionated: 0.29 IU/mL — ABNORMAL LOW (ref 0.30–0.70)

## 2020-12-19 LAB — TROPONIN I (HIGH SENSITIVITY)
Troponin I (High Sensitivity): 7 ng/L (ref <18)
Troponin I (High Sensitivity): 8 ng/L (ref <18)

## 2020-12-19 LAB — HEMOGLOBIN AND HEMATOCRIT, BLOOD
HCT: 39.5 % (ref 39.0–52.0)
Hemoglobin: 12.5 g/dL — ABNORMAL LOW (ref 13.0–17.0)

## 2020-12-19 MED ORDER — APIXABAN 5 MG PO TABS
5.0000 mg | ORAL_TABLET | Freq: Two times a day (BID) | ORAL | Status: DC
  Administered 2020-12-19 – 2020-12-23 (×9): 5 mg via ORAL
  Filled 2020-12-19 (×9): qty 1

## 2020-12-19 NOTE — Progress Notes (Addendum)
ANTICOAGULATION CONSULT NOTE  Pharmacy Consult for heparin Indication: atrial fibrillation  Allergies  Allergen Reactions  . Pembrolizumab Other (See Comments)    1) severe neuropathy 2) may or may not be causing GBS as well, this still TBD, see 12/16/2020 admission for further details  . Diltiazem Other (See Comments)    Pt didn't feel well     Patient Measurements: Height: 5\' 6"  (167.6 cm) Weight: 102.1 kg (225 lb) IBW/kg (Calculated) : 63.8 Heparin Dosing Weight: 86.4 kg  Vital Signs: Temp: 98 F (36.7 C) (01/01 0804) Temp Source: Axillary (01/01 0349) BP: 124/53 (01/01 0804) Pulse Rate: 71 (01/01 0349)  Labs: Recent Labs    12/17/20 0314 12/17/20 0347 12/17/20 0347 12/17/20 1133 12/18/20 0754 12/18/20 2139 12/19/20 0221  HGB 12.5*  --   --   --   --   --   --   HCT 38.1*  --   --   --   --   --   --   PLT 395  --   --   --   --   --   --   APTT  --  85*  --  75* 66*  --   --   HEPARINUNFRC  --  0.79*   < > 0.62 0.32 0.17* 0.29*  CREATININE 1.21  --   --   --   --   --   --    < > = values in this interval not displayed.    Estimated Creatinine Clearance: 55.4 mL/min (by C-G formula based on SCr of 1.21 mg/dL).   Medical History: Past Medical History:  Diagnosis Date  . Anticoagulated on Coumadin    followed by [facility]  . Atrial fibrillation (HCC)   . Back pain   . Bladder mass   . BPH (benign prostatic hypertrophy)   . Chronic atrial fibrillation (HCC)   . Diastolic CHF, chronic [facility])    cardiologist-- dr [facility]-- asymptomatic  . Diverticulosis of colon   . Edema   . Essential tremor 06-2015  . History of urinary retention   . HTN (hypertension)   . OSA on CPAP   . Peripheral neuropathy    BILATERAL FEET  . Prediabetes   . PTSD (post-traumatic stress disorder)   . Renal cell carcinoma of left kidney (HCC)     Medications:  See med history  Assessment: 80 yo man admitted for small subacute ischemic stroke. Patient on PTA  Eliquis for atrial fibrillation. Eliquis held and pharmacy consulted to start heparin drip.  Last dose eliquis 12/29. Heparin held for possible LP then resumed.   1/1: HL slightly subtherapeutic at 0.29 this morning with drip running at 1600 units/hr.  No issues with line, no bleeding per RN.  No CBC drawn today -will check tomorrow morning.  Will increase rate slightly to 1700 units/hr (~2 units/kg/hr) and follow up HL with morning labs 12/20/20.  Goal of Therapy:  Heparin level 0.3-0.7 units/ml aPTT 66-102 seconds Monitor platelets by anticoagulation protocol: Yes   Plan:  Increase heparin to 1700 units/h Recheck heparin level with daily labs F/u CBC  F/u oral anticoagulation plans  02/17/21, PharmD PGY-1 Acute Care Pharmacy Resident Office: [phone] 12/19/2020 8:34 AM

## 2020-12-19 NOTE — Progress Notes (Addendum)
Neurology Progress Note  S: Tired this am. States he did not sleep well last night. He did not have any further PT after what NP charted yesterday. In PT note, pt was only able to ambulate 2' yesterday compared to 8' day before. Still needs assistance with sitting up, pivot transfers, and walking. He doesn't feel like "much has changed" since yesterday. No saddle anesthesia and urinary incontinence is the same as he has had chronically. Still c/o general "soreness" to skin and bone.   O: Current vital signs: BP (!) 124/53   Pulse 71   Temp 98 F (36.7 C)   Resp 18   Ht 5\' 6"  (1.676 m)   Wt 102.1 kg   SpO2 94%   BMI 36.32 kg/m  Vital signs in last 24 hours: Temp:  [97.5 F (36.4 C)-98.2 F (36.8 C)] 98 F (36.7 C) (01/01 0804) Pulse Rate:  [42-90] 71 (01/01 0349) Resp:  [16-23] 18 (01/01 0349) BP: (109-127)/(46-85) 124/53 (01/01 0804) SpO2:  [94 %-97 %] 94 % (01/01 0349)  GENERAL: Lethargic upon arrival, but becomes alert with conversation and exam. NAD HEENT: Normocephalic and atraumatic, dry mm LUNGS: Normal respiratory effort. SaO2 97% on RA.  CV: Afib with HR 58 on tele ABDOMEN: Soft Ext: warm   NEURO:  Mental Status: AA&Ox3  Speech/Language: speech is without dysarthria.  Naming, repetition, fluency, and comprehension intact. Cranial Nerves:  II: PERRL. Visual fields full.  III, IV, VI: EOMI. Eyelids elevate symmetrically.  V: Sensation is intact to light touch and symmetrical to face.  VII: Smile is symmetrical. Able to puff cheeks and raise eyebrows.  VIII: hearing intact to voice. IX, X: Palate elevates symmetrically. Phonation is normal.  RL:[id number] shrug 5/5. XII: tongue is midline without fasciculations. Motor: 5/5 UEs. Grips 5/5. Hip flexion 3+/5 on right and 4+/5 on left. Knee extension 5/5 bilaterally. Plantar flexion is 5/5 bilaterally. Ankle dorsiflexion is 5/5 on right, 4/5 on left.  Tone: is normal and bulk is normal. No drift in UEs or LEs. Able to  raise right leg higher today since supine. Still can not raise right leg as high as left.  Sensation- Intact to light touch bilaterally in all 4 extremities. Pinprick sensation intact to UEs. RLE pinprick sensation absent from toes to ankle; LLE absent from toes to mid foot. Coordination:No ataxia noted.   DTRs: 2+ bilateral brachioradialus with right biceps. 0 left biceps. 2+ left triceps. 1+ right triceps. 0 bilateral patellae and achilles.   Medications  Current Facility-Administered Medications:  .  acetaminophen (TYLENOL) tablet 500 mg, 500 mg, Oral, Q6H PRN, Etta Quill, DO, 500 mg at 12/16/20 2346 .  cholecalciferol (VITAMIN D3) tablet 2,000 Units, 2,000 Units, Oral, Daily, Etta Quill, DO, 2,000 Units at 12/18/20 1205 .  heparin ADULT infusion 100 units/mL (25000 units/285mL), 1,700 Units/hr, Intravenous, Continuous, Swayze, Ava, DO, Last Rate: 16 mL/hr at 12/18/20 2228, 1,600 Units/hr at 12/18/20 2228 .  hydrocortisone (ANUSOL-HC) 2.5 % rectal cream, , Topical, BID, Etta Quill, DO, Given at 12/18/20 2114 .  iohexol (OMNIPAQUE) 350 MG/ML injection 75 mL, 75 mL, Intravenous, Once PRN, Kerney Elbe, MD .  ketoconazole (NIZORAL) 2 % shampoo 1 application, 1 application, Topical, Once per day on Mon Thu, Gardner, Jared M, DO .  loratadine (CLARITIN) tablet 10 mg, 10 mg, Oral, Daily PRN, Etta Quill, DO .  metoprolol tartrate (LOPRESSOR) tablet 25 mg, 25 mg, Oral, BID, Jennette Kettle M, DO, 25 mg at 12/18/20 2114 .  multivitamin with minerals tablet 1 tablet, 1 tablet, Oral, Daily, Etta Quill, DO, 1 tablet at 12/18/20 1159 .  polyvinyl alcohol (LIQUIFILM TEARS) 1.4 % ophthalmic solution 1 drop, 1 drop, Both Eyes, TID PRN, Alcario Drought, Jared M, DO .  pregabalin (LYRICA) capsule 25 mg, 25 mg, Oral, BID, Jennette Kettle M, DO, 25 mg at 12/18/20 2114 .  senna (SENOKOT) tablet 8.6 mg, 1 tablet, Oral, QHS, Gardner, Jared M, DO, 8.6 mg at 12/18/20 2114 .  traZODone (DESYREL)  tablet 100 mg, 100 mg, Oral, QHS PRN, Etta Quill, DO .  vitamin B-12 (CYANOCOBALAMIN) tablet 2,000 mcg, 2,000 mcg, Oral, Daily, Swayze, Ava, DO, 2,000 mcg at 12/18/20 1159 Labs CBC    Component Value Date/Time   WBC 8.1 12/17/2020 0314   RBC 4.24 12/17/2020 0314   HGB 12.5 (L) 12/17/2020 0314   HCT 38.1 (L) 12/17/2020 0314   PLT 395 12/17/2020 0314   MCV 89.9 12/17/2020 0314   MCH 29.5 12/17/2020 0314   MCHC 32.8 12/17/2020 0314   RDW 13.8 12/17/2020 0314    CMP     Component Value Date/Time   NA 134 (L) 12/17/2020 0314   K 4.1 12/17/2020 0314   CL 103 12/17/2020 0314   CO2 23 12/17/2020 0314   GLUCOSE 113 (H) 12/17/2020 0314   BUN 16 12/17/2020 0314   CREATININE 1.21 12/17/2020 0314   CALCIUM 10.0 12/17/2020 0314   PROT 7.1 12/17/2020 0314   ALBUMIN 3.0 (L) 12/17/2020 0314   AST 12 (L) 12/17/2020 0314   ALT 10 12/17/2020 0314   ALKPHOS 54 12/17/2020 0314   BILITOT 0.5 12/17/2020 0314   GFRNONAA >60 12/17/2020 0314   GFRAA >60 07/20/2015 1715    Lipid Panel     Component Value Date/Time   CHOL 140 12/16/2020 0419   TRIG 77 12/16/2020 0419   HDL 33 (L) 12/16/2020 0419   CHOLHDL 4.2 12/16/2020 0419   VLDL 15 12/16/2020 0419   LDLCALC 92 12/16/2020 0419     Imaging-previously reviewed CTH, CTA Head and Neck and MRI brain.   Assessment: 80 yo male who presented to the ED on 12/28 with sudden onset of bilateral leg weakness and a sensation of heaviness affecting his mobility. Pt has chronic neuropathy diagnosed in 1998. DDx included GBS due to weakness and areflexia to BLE, however, this weakness was sudden onset and has not been progressive during hospitalization; he also suffers from a chronic neuropathy affecting the lower extremities. Additionally, on review of PT notes, he has generalized weakness in BUE and BLE but this is chronic. DOAC for AF was stopped and Heparin was started here in preparation for an LP under fluoro. However, when pt seen yesterday, his  symptoms seem to be waxing and waning with a more generalized neuropathy presentation. LP was cancelled due to the risks of stopping AC in the context of his AF with low likelihood of diagnostically useful information. Thought was also given to spinal cord infarct but this was not seen on spinal imaging and his presentation with bilateral hip flexor weakness sparing the quadriceps, hamstrings and distal muscles is not consistent with that diagnosis either. His strength exam is patchy and has not changed since admission.   Impression: 1. Multifocal neuropathy with generalized deconditioning.  2. 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 Most likely unrelated to his presentation. Stroke workup is completed. His LDL is over 70.  3. 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  4. AFib  with chronic AC. Rate controlled.   Recommendations: -Follow up with outpatient Neurology after rehab stay. Recommend EMG/NCS as an outpatient. -Start statin if pt able to tolerate. Will defer to hospitalist.  -Follow up with oncology re: renal cell CA and mass.  -Will stop Heparin and restart Eliquis per home dose for AF. NP called pharmD and there is no waiting period for starting Eliquis after Heparin stopped.  -Continue PT/OT and further rehab per team.  - Neurology will sign off. Please call if there are additional questions.   Pt seen by Jimmye Norman, MSN, APN-BC/Nurse Practitioner/Neuro and later by MD.   Pager: [phone]  Electronically signed: Dr. Caryl Pina

## 2020-12-19 NOTE — H&P (Signed)
Physical Medicine and Rehabilitation Admission H&P    Chief Complaint  Patient presents with  . Weakness  : HPI: Evan Kirk is a 80 year old right-handed male with history of atrial fibrillation maintained on Eliquis, hypertension, OSA, renal cancer with left nephrectomy 2016 with local recurrence of tumor surrounding IVC/aorta undergoing immunotherapy receiving chemo via oncology [facility] as well as [facility] [phone] as well as Dr. Thedore Mins, BPH, diastolic congestive heart failure, essential tremor, prediabetes. History taken from chart review and patient. Patient lives with spouse.  1 level home ramped entrance.  Independent with assistive device.  Wife unable physically to assist. He presented on 12/15/2020 with bilateral lower extremity weakness, right greater than left of acute onset.  CT/MRI showed small focus of acute ischemia in the superior left hemisphere. No hemorrhage or mass-effect.  CT angiogram of head and neck no emergent large vessel occlusion or stenosis.  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  No evidence of metastatic disease to the thoracic or lumbar spine.  Mild edema and contrast enhancement at the endplates of T5-67-8 T10-11 without discrete masslike lesion. Echocardiogram with ejection fraction 60-65%, no wall motion abnormalities.  Admission chemistries unremarkable except sodium 134, glucose 100, hemoglobin 12.7.  Neurology follow-up maintained on intravenous heparin transitioned to Eliquis.  Physical medicine rehab consult requested to assess candidacy for CIR given lower extremity weakness. Patient was admitted for a comprehensive rehab program. Please see preadmission assessment from earlier today as well.  Review of Systems  Constitutional: Positive for malaise/fatigue. Negative for  chills and fever.  HENT: Negative for hearing loss.   Eyes: Negative for blurred vision and double vision.  Respiratory: Negative for cough.   Cardiovascular: Negative for chest pain.  Gastrointestinal: Positive for constipation. Negative for heartburn, nausea and vomiting.  Genitourinary: Positive for urgency. Negative for dysuria, flank pain and hematuria.  Musculoskeletal: Positive for back pain.  Neurological: Positive for tremors, focal weakness and weakness. Negative for speech change.  Psychiatric/Behavioral:       PTSD  All other systems reviewed and are negative.  Past Medical History:  Diagnosis Date  . Anticoagulated on Coumadin    [facility]  . Atrial fibrillation (HCC)   . Back pain   . Bladder mass   . BPH (benign prostatic hypertrophy)   . Chronic atrial fibrillation (HCC)   . Diastolic CHF, chronic [facility])    cardiologist-- dr Verdis Prime-- asymptomatic  . Diverticulosis of colon   . Edema   . Essential tremor 06-2015  . History of urinary retention   . HTN (hypertension)   . OSA on CPAP   . Peripheral neuropathy    BILATERAL FEET  . Prediabetes   . PTSD (post-traumatic stress disorder)   . Renal cell carcinoma of left kidney [facility])    Past Surgical History:  Procedure Laterality Date  . CYSTOSCOPY WITH BIOPSY N/A 10/09/2015   Procedure: CYSTOSCOPY WITH BIOPSY;  Surgeon: Malen Gauze, MD;  Location: [facility];  Service: Urology;  Laterality: N/A;  . NEPHRECTOMY Left   . NEPHRECTOMY RADICAL Left 08/13/2015   [facility]-  . TOTAL KNEE ARTHROPLASTY Right 11-10-2008  . TRANSTHORACIC ECHOCARDIOGRAM  08-12-2015      moderate concentric LVH, ef 50-55-%/  trivial MR , PR and TR/  severe LAE and RAE  .  VASECTOMY REVERSAL  1978   Family History  Problem Relation Age of Onset  . Stroke Mother    Social History:  reports that he quit smoking about 54 years ago. He has quit using smokeless tobacco. He reports that he does not  drink alcohol and does not use drugs. Allergies:  Allergies  Allergen Reactions  . Pembrolizumab Other (See Comments)    1) severe neuropathy 2) may or may not be causing GBS as well, this still TBD, see 12/16/2020 admission for further details  . Diltiazem Other (See Comments)    Pt didn't feel well    Medications Prior to Admission  Medication Sig Dispense Refill  . acetaminophen (TYLENOL) 500 MG tablet Take 500 mg by mouth every 6 (six) hours as needed for mild pain.    Marland Kitchen apixaban (ELIQUIS) 5 MG TABS tablet Take 5 mg by mouth 2 (two) times daily.    Marland Kitchen atorvastatin (LIPITOR) 40 MG tablet Take 1 tablet (40 mg total) by mouth daily. 30 tablet 0  . benzoyl peroxide 5 % gel Apply 1 application topically daily as needed (skin irritation).    . carboxymethylcellulose (REFRESH PLUS) 0.5 % SOLN Place 1 drop into both eyes 3 (three) times daily as needed (dry eyes).    . cholecalciferol (VITAMIN D) 1000 UNITS tablet Take 2,000 Units by mouth daily.    . hydrocortisone 2.5 % lotion Apply topically 2 (two) times daily.    Marland Kitchen ketoconazole (NIZORAL) 2 % shampoo Apply 1 application topically 2 (two) times a week.    . lactulose (CHRONULAC) 10 GM/15ML solution Take 30 mLs (20 g total) by mouth once for 1 dose. 236 mL 0  . loratadine (CLARITIN) 10 MG tablet Take 10 mg by mouth daily as needed for allergies.     . metroNIDAZOLE (METROCREAM) 0.75 % cream Apply 1 application topically daily as needed. (roscea)    . Multiple Vitamin (MULTIVITAMIN) capsule Take 1 capsule by mouth daily.    . NON FORMULARY Peripheral Neuropathy Cream: Bupivacaine 1% Doxepin 3% Gabapentin 6% Pentoxifyline 3% Topiramate 1% Sig: Apply 1 to 2 gm to affected areas 3-4x daily prn  Refill 1    . polyethylene glycol (MIRALAX / GLYCOLAX) 17 g packet Take 17 g by mouth daily. 14 each 0  . pregabalin (LYRICA) 25 MG capsule Take 1 capsule (25 mg total) by mouth 2 (two) times daily. 60 capsule 0  . senna (SENOKOT) 8.6 MG tablet Take 1  tablet by mouth at bedtime.    . vitamin B-12 (CYANOCOBALAMIN) 500 MCG tablet Take 2,000 mcg by mouth daily.      Drug Regimen Review Drug regimen was reviewed and remains appropriate with no significant issues identified  Home: Home Living Family/patient expects to be discharged to:: Private residence Living Arrangements: Spouse/significant other Available Help at Discharge: Family Type of Home: House Home Access: Ramped entrance Home Layout: One level Bathroom Shower/Tub: Engineer, manufacturing systems: Handicapped height Home Equipment: Grab bars - tub/shower,Grab bars - toilet,Cane - single point,Walker - 2 wheels Additional Comments: Wife unable to physically assist  Lives With: Spouse   Functional History: Prior Function Level of Independence: Independent with assistive device(s) Comments: Was using RW since having weakness.  Functional Status:  Mobility: Bed Mobility Overal bed mobility: Needs Assistance Bed Mobility: Supine to Sit Sidelying to sit: Total assist,+2 for physical assistance,HOB elevated Supine to sit: HOB elevated,Min guard Sit to supine: Min assist General bed mobility comments: Extra time and cues provided to manage  legs off EOB and ascend trunk, min guard for safety. Transfers Overall transfer level: Needs assistance Equipment used: Rolling walker (2 wheeled) Transfers: Sit to/from Stand Sit to Stand: Min assist Stand pivot transfers: Mod assist,+2 physical assistance General transfer comment: Extra time and minA to power up to stand and steady self, cuing for proper hand placement. Ambulation/Gait Ambulation/Gait assistance: Min assist Gait Distance (Feet): 50 Feet (x2 bouts of ~25 ft > ~50 ft) Assistive device: Rolling walker (2 wheeled) Gait Pattern/deviations: Step-through pattern,Decreased step length - right,Decreased step length - left,Narrow base of support,Decreased stride length,Trunk flexed General Gait Details: Slow, unsteady gait  with decreased bilat step length. Provided verbal and tactile cues at bilat quads to facilitate knee extension in stance to allow for increased stance time and thus step length, with mod success. As distance progressed, pt's fatigue increased resulting in R knee buckling and minA to recover balance. Gait velocity: reduced Gait velocity interpretation: <1.31 ft/sec, indicative of household ambulator    ADL: ADL Overall ADL's : Needs assistance/impaired Eating/Feeding: Independent,Bed level Eating/Feeding Details (indicate cue type and reason): setup for meal and MD arriving Grooming: Minimal assistance,Sitting Grooming Details (indicate cue type and reason): pt sleeping and R eye appears closed with secretions. OT total (A) to help with arousal and opening of eye Upper Body Bathing: Minimal assistance,Sitting Lower Body Bathing: Moderate assistance,Sit to/from stand Upper Body Dressing : Minimal assistance,Sitting Lower Body Dressing: Total assistance Toilet Transfer: Moderate assistance,+2 for physical assistance,+2 for safety/equipment,Stand-pivot,BSC Toilet Transfer Details (indicate cue type and reason): pt requires one therapist assisting guiding RW and hips on L side and one therapist on R side to maintain knee extension Toileting- Clothing Manipulation and Hygiene: Moderate assistance,Sit to/from stand Functional mobility during ADLs: Moderate assistance,+2 for physical assistance,+2 for safety/equipment,Rolling walker General ADL Comments: pt with decreased alertness at start of session requiring max multimodal cues and mobility initation to wake up. knee buckling limits safe ADL mobility progression  Cognition: Cognition Overall Cognitive Status: Impaired/Different from baseline Arousal/Alertness: Awake/alert Orientation Level: Oriented X4 Memory: Impaired Memory Impairment: Decreased recall of new information (4/4 words recalled independently, but Pt reports "struggle with  memory") Awareness: Appears intact Problem Solving: Appears intact Safety/Judgment: Appears intact Cognition Arousal/Alertness: Awake/alert Behavior During Therapy: WFL for tasks assessed/performed Overall Cognitive Status: Impaired/Different from baseline Area of Impairment: Safety/judgement Current Attention Level: Sustained Memory: Decreased recall of precautions Following Commands: Follows one step commands with increased time Safety/Judgement: Decreased awareness of safety,Decreased awareness of deficits Awareness: Emergent Problem Solving: Slow processing,Requires verbal cues,Difficulty sequencing General Comments: Poor safety awareness as pt will continue to ambulate even when evidently getting fatigued enough for knees to buckle, placing him at risk for injury.  Physical Exam: Blood pressure 130/60, pulse 67, temperature 97.9 F (36.6 C), temperature source Oral, resp. rate 19, height 5\' 6"  (1.676 m), weight 102.1 kg, SpO2 96 %. Physical Exam Vitals reviewed.  Constitutional:      General: He is not in acute distress.    Appearance: Normal appearance.  HENT:     Head: Normocephalic and atraumatic.     Right Ear: External ear normal.     Left Ear: External ear normal.     Nose: Nose normal.  Eyes:     General:        Right eye: No discharge.        Left eye: No discharge.     Extraocular Movements: Extraocular movements intact.  Cardiovascular:     Comments: Irregularly irregular Pulmonary:  Effort: Pulmonary effort is normal. No respiratory distress.     Breath sounds: No stridor.  Abdominal:     General: Abdomen is flat. Bowel sounds are normal. There is no distension.  Musculoskeletal:     Cervical back: Normal range of motion and neck supple.     Comments: No edema or tenderness in extremities  Skin:    General: Skin is warm and dry.  Neurological:     Mental Status: He is alert.     Comments: Alert and oriented x3 Follows commands Motor: Bilateral  upper extremities: 5/5 proximal distal Right lower extremity: Hip flexion, knee extension 2/5, dorsiflexion 4/5 Left lower extremity: Hip flexion, knee extension 2+/5 plantarflexion 4/5 Left facial weakness  Psychiatric:        Mood and Affect: Mood normal.        Behavior: Behavior normal.     No results found for this or any previous visit (from the past 48 hour(s)). No results found.  Medical Problem List and Plan: 1. Bilateral lower extremity weakness secondary to multifocal mononeuropathy.  -patient may shower  -ELOS/Goals: 14-17 days/Supervision/min a  Admit to CIR 2.  Antithrombotics: -DVT/anticoagulation: Eliquis  -antiplatelet therapy: N/A 3. Pain Management: Lyrica 25 mg twice daily 4. Mood: Provide emotional support.    -antipsychotic agents: N/A 5. Neuropsych: This patient is capable of making decisions on his own behalf. 6. Skin/Wound Care: Routine skin checks 7. Fluids/Electrolytes/Nutrition: Routine in and outs  CMP ordered for tomorrow. 8.  Atrial fibrillation.  Continue Eliquis.  Cardiac rate controlled  Monitor with increased activity 9.  OSA.  CPAP 10.  Recurrent renal carcinoma.  Followed by oncology services [facility] as well at [facility]  Large retroperitoneal encasing infrarenal IVC suspected occlusion  11. Hyperlipidemia: Lipitor 12. [place] acute ischemic infarct left cerebral hemisphere superiorly  Exacerbating multifocal mononeuropathy 14. Hyponatremia  Sodium 134 on 1/3, CMP ordered for tomorrow   Cathlyn Parsons, PA-C 12/23/2020  I have personally performed a face to face diagnostic evaluation, including, but not limited to relevant history and physical exam findings, of this patient and developed relevant assessment and plan.  Additionally, I have reviewed and concur with the physician assistant's documentation above.  Delice Lesch, MD, ABPMR

## 2020-12-19 NOTE — Plan of Care (Signed)
  Problem: Education: Goal: Knowledge of disease or condition will improve Outcome: Progressing Goal: Knowledge of secondary prevention will improve Outcome: Progressing Goal: Knowledge of patient specific risk factors addressed and post discharge goals established will improve Outcome: Progressing   Problem: Coping: Goal: Will verbalize positive feelings about self Outcome: Progressing Goal: Will identify appropriate support needs Outcome: Progressing   Problem: Self-Care: Goal: Ability to participate in self-care as condition permits will improve Outcome: Progressing   Problem: Nutrition: Goal: Risk of aspiration will decrease Outcome: Progressing   Problem: Ischemic Stroke/TIA Tissue Perfusion: Goal: Complications of ischemic stroke/TIA will be minimized Outcome: Progressing

## 2020-12-19 NOTE — Progress Notes (Signed)
Pt to place self on cpap °

## 2020-12-19 NOTE — Progress Notes (Addendum)
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  He is recently started immunotherapy at [facility] using pembrolizumab/axitinib.  He presented to the ED 12/29 with leg weakness tingling and burning for 48 hours with the right leg greater than left leg weakness as well.  In the ED CT head was unremarkable. CT thoracic lumbar spine showed mass as described below. MRI brain shows a punctate acute infarct in the superior left frontal hemisphere, close to the motor strip. No bleeding. No mass-effect. MRI of the thoracic and lumbar spine with no evidence of spinal cord infarct.  Consultants  . PCCM . Neurology . Rehab medicine  Procedures  . None  Antibiotics   Anti-infectives (From admission, onward)   None     Subjective  The patient is lying in bed. He is dyspneic and is complaining of chest tightness and mid-sternal chest pain.   Objective   Vitals:  Vitals:   12/19/20 0940 12/19/20 1153  BP: (!) 124/102 117/73  Pulse: 86   Resp:    Temp:  97.7 F (36.5 C)  SpO2:     Exam:  Constitutional:  The patient is awake, alert, and oriented x 3. Mild distress Respiratory:  . No increased work of breathing. . No wheezes, rales, or rhonchi . No tactile fremitus Cardiovascular:  . Regular rate and rhythm . No murmurs, ectopy, or gallups. . No lateral PMI. No thrills. Abdomen:  . Abdomen is soft, non-tender, non-distended . No hernias, masses, or organomegaly . Normoactive bowel sounds.  Musculoskeletal:  . No cyanosis, clubbing, or edema Skin:  . No rashes, lesions, ulcers . palpation of skin: no induration or nodules Neurologic:  . CN 2-12 intact . Sensation all 4 extremities intact . Weakness of lower  extremities bilaterally. Left greater than right. Psychiatric:  . Mental status o Mood, affect appropriate o Orientation to person, place, time  . judgment and insight appear intact I have personally reviewed the following:   Today's Data  . Vitals, Troponin  Imaging  . CTA head and neck . CXR . CTA chest - pending.  Cardiology Data  . Echocardiogram . EKG  Scheduled Meds: . apixaban  5 mg Oral BID  . cholecalciferol  2,000 Units Oral Daily  . hydrocortisone   Topical BID  . ketoconazole  1 application Topical Once per day on Mon Thu  . metoprolol tartrate  25 mg Oral BID  . multivitamin with minerals  1 tablet Oral Daily  . pregabalin  25 mg Oral BID  . senna  1 tablet Oral QHS  . vitamin B-12  2,000 mcg Oral Daily   Continuous Infusions:   Principal Problem:   Acute ischemic stroke [facility]) Active Problems:   Atrial fibrillation (HCC)   Clear cell renal cell carcinoma (HCC)   Neuropathy   LOS: 3 days   A & P  Small subacute ischemic stroke left motor strip: Explains right lower extremity weakness. The patient is chronically on Eliquis for atrial fibrillation. Neuro following. Stroke work-up to be completed with TTE and CTA of head and neck. CTAhead and neck demonstrates no large vessel occlusion or high grade stenosis of the head or neck. Echocardiogram with Bubble study is pending.  Chest pain with dyspnea: The patient is  saturating 94% on room air. CXR demonstrates no acute pathology. EKG is atrial fibrillation with controlled rates unchanged from most recent EKG from May 2021. Troponin is 7. I have ordered CTA chest and H&H.  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 Pt will follow up with oncology at the [facility] as is his wish.    Bilateral lower extremity progressive paresthesias and weakness: Due to multifocal mononeuropathy per neurology. They have recommended EMG and nerve conduction studies as outpatient.   Recurrent  renal cell carcinoma: Followed by Oncology at [facility], but actually receiving chemo via Oncologist at [place], [place] (Dr. Thedore Mins). The patient has been in communication with his oncologist who does not feel that the retroperitoneal  mass could be related to RCC. Phone number for Oncologist on call at the [place] ( Dr. Ervin Knack [phone].  Chronic atrial fibrillation on Eliquis: Held. The patient has been transitioned to heparin drip. Rate controlled with a couple of episodes of bradycardia.  Obstructive Sleep apnea: Dependent upon CPAP nightly  I have seen and examined this patient myself. I have spent 36 minutes in his evaluation and care.  Code Status: FULL CODE Family Communication: Spoke with son and patient at bedside Status is: Inpatient  Remains inpatient appropriate because:Inpatient level of care appropriate due to severity of illness  Dispo: The patient is from: Home  Anticipated d/c is to: unclear  Anticipated d/c date is: 2 days  Patient currently is not medically stable to d/c.  Evan Mincey, DO [facility] Direct contact: see [email or web address]  7PM-7AM contact night coverage as above 12/19/2020, 2:10 PM  LOS: 1 day   ADDENDUM: CXR demonstrated no acute pathology. EKG was unchanged. It continues to demonstrate atrial fibrillation with a controlled rate. Troponin was 7. CTA chest has been ordered.

## 2020-12-19 NOTE — Progress Notes (Signed)
Pt c/o being in urine since 4 am. This RN did not notice any soak sheets or gown during rounds. Pt noted to be sleeping every time this RN entered the room, even during vitals during the time stated above and there after. Pt did mention being wet with urine during handoff and oncoming RN/NT aware. This RN offered to assist oncoming NT with hygiene care of patient. Staff well staffed this AM and my assistance was not needed.

## 2020-12-19 NOTE — Plan of Care (Signed)
Max assist adls 

## 2020-12-20 DIAGNOSIS — C642 Malignant neoplasm of left kidney, except renal pelvis: Secondary | ICD-10-CM | POA: Diagnosis not present

## 2020-12-20 DIAGNOSIS — R29898 Other symptoms and signs involving the musculoskeletal system: Secondary | ICD-10-CM | POA: Diagnosis not present

## 2020-12-20 DIAGNOSIS — I482 Chronic atrial fibrillation, unspecified: Secondary | ICD-10-CM | POA: Diagnosis not present

## 2020-12-20 LAB — BASIC METABOLIC PANEL
Anion gap: 10 (ref 5–15)
BUN: 20 mg/dL (ref 8–23)
CO2: 24 mmol/L (ref 22–32)
Calcium: 10.7 mg/dL — ABNORMAL HIGH (ref 8.9–10.3)
Chloride: 98 mmol/L (ref 98–111)
Creatinine, Ser: 1.21 mg/dL (ref 0.61–1.24)
GFR, Estimated: >60 mL/min (ref >60)
Glucose, Bld: 120 mg/dL — ABNORMAL HIGH (ref 70–99)
Potassium: 4.5 mmol/L (ref 3.5–5.1)
Sodium: 132 mmol/L — ABNORMAL LOW (ref 135–145)

## 2020-12-20 LAB — CBC
HCT: 37.5 % — ABNORMAL LOW (ref 39.0–52.0)
Hemoglobin: 12.4 g/dL — ABNORMAL LOW (ref 13.0–17.0)
MCH: 29.4 pg (ref 26.0–34.0)
MCHC: 33.1 g/dL (ref 30.0–36.0)
MCV: 88.9 fL (ref 80.0–100.0)
Platelets: 422 10*3/uL — ABNORMAL HIGH (ref 150–400)
RBC: 4.22 MIL/uL (ref 4.22–5.81)
RDW: 13.7 % (ref 11.5–15.5)
WBC: 9.3 10*3/uL (ref 4.0–10.5)
nRBC: 0 % (ref 0.0–0.2)

## 2020-12-20 LAB — TROPONIN I (HIGH SENSITIVITY)
Troponin I (High Sensitivity): 9 ng/L (ref <18)
Troponin I (High Sensitivity): 9 ng/L (ref <18)

## 2020-12-20 MED ORDER — SODIUM CHLORIDE 0.9 % IV SOLN
INTRAVENOUS | Status: AC
  Administered 2020-12-20: 1000 mL via INTRAVENOUS

## 2020-12-20 MED ORDER — ATORVASTATIN CALCIUM 40 MG PO TABS
40.0000 mg | ORAL_TABLET | Freq: Every day | ORAL | Status: DC
Start: 2020-12-20 — End: 2020-12-23
  Administered 2020-12-20 – 2020-12-23 (×4): 40 mg via ORAL
  Filled 2020-12-20 (×4): qty 1

## 2020-12-20 MED ORDER — METOPROLOL TARTRATE 12.5 MG HALF TABLET
12.5000 mg | ORAL_TABLET | Freq: Two times a day (BID) | ORAL | Status: DC
  Administered 2020-12-20: 12.5 mg via ORAL
  Filled 2020-12-20 (×2): qty 1

## 2020-12-20 NOTE — Progress Notes (Signed)
Pt able to place himself on CPAP dreamstation for the night. Pt is in auto titrate 14 max w/ no oxygent bled into the system. RT will continue to monitor.

## 2020-12-20 NOTE — Plan of Care (Signed)
  Problem: Coping: Goal: Will verbalize positive feelings about self Outcome: Progressing Goal: Will identify appropriate support needs Outcome: Progressing   

## 2020-12-20 NOTE — Progress Notes (Addendum)
[facility]  PROGRESS NOTE  CONAN BAPTIST RFX:[id number] DOB: [birth year] DOA: 12/15/2020 PCP: Daisy Floro, MD Admit date - 12/15/2020   Admitting Physician Hillary Bow, DO  Outpatient Primary MD for the patient is Daisy Floro, MD  LOS - 4 Brief Narrative   Evan Kirk is a 80 y.o. 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  Subjective  This morning he states he feels like he is having a panic attack noting some burning and lower part of his chest and upper abdomen and heaviness with his breathing with no radiation has been ongoing for the past hour he states.  A & P   Multifocal neuropathy with generalized deconditioning, remained stable.  Seems to affect bilateral lower extremities (hip flexor weakness right greater than left) more than upper extremities, no changes in neurologic exam since admission, no saddle anesthesia or urinary incontinence.  LP canceled due to low diagnostic yield per neurology and concern for stopping anticoagulation given A. fib -Neurology recommends outpatient neurology follow-up as well as EMG/NCS as outpatient -PT/OT recommends CIR, they have been consulted  Subacute ischemic stroke left cerebral hemisphere.  Neurology does not believe this explains his lower extremity weakness.  TTE, CT head/neck unremarkable regarding stroke workup -Continue Eliquis -Start statin as recommended by neurology given LDL not at goal  Hypercalcemia, mild.  Calcium 10.7.  Absolute of trended from 10.2 on admission.  Could related to known malignancy as  well as further complication from decreased ability -Give IV fluids 100cc x 24 hours -Monitor calcium in a.m. on BMP  Chronic atrial fibrillation, rate.  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  Confirmed on CT lumbar spine. -Patient will continue follow-up with his oncologist at the [facility]  Reported chest discomfort, ruled out cardiac etiology.  Patient states he has these similar episodes at home and reports this consistent with his panic attacks.  Troponin trend x2 unremarkable, EKG with no ischemic changes.  CTA chest on 1/1 with no acute abnormalities.  Chest x-ray also unremarkable, stable O2 on room air -Continue monitor, telemetry     Family Communication  : None none  Code Status : Full  Disposition Plan  :  Patient is from home. Anticipated d/c date: 2 to 3 days. Barriers to d/c or necessity for inpatient status:  CIR Consults  : Neurology  Procedures  : TTE  DVT Prophylaxis  : Eliquis MDM: The below labs and imaging reports were reviewed and summarized above.  Medication management as above.  Lab Results  Component Value Date   PLT 422 (H) 12/20/2020    Diet :  Diet Order            Diet Heart Room service appropriate? Yes; Fluid consistency: Thin  Diet effective now                  Inpatient Medications Scheduled Meds:  apixaban  5 mg Oral BID   atorvastatin  40 mg Oral Daily  cholecalciferol  2,000 Units Oral Daily   hydrocortisone   Topical BID   ketoconazole  1 application Topical Once per day on Mon Thu   metoprolol tartrate  12.5 mg Oral BID   multivitamin with minerals  1 tablet Oral Daily   pregabalin  25 mg Oral BID   senna  1 tablet Oral QHS   vitamin B-12  2,000 mcg Oral Daily   Continuous Infusions:  sodium chloride 1,000 mL (12/20/20 0920)   PRN  Meds:.acetaminophen, loratadine, polyvinyl alcohol, traZODone  Antibiotics  :   Anti-infectives (From admission, onward)   None       Objective   Vitals:   12/20/20 0435 12/20/20 0817 12/20/20 1315 12/20/20 1715  BP: (!) 132/95 126/70 121/61 (!) 113/55  Pulse: 73 (!) 58 (!) 52 (!) 50  Resp: 17 18 18 18   Temp: 98.2 F (36.8 C) (!) 97.5 F (36.4 C) 97.7 F (36.5 C) (!) 97.5 F (36.4 C)  TempSrc: Oral Axillary Oral Oral  SpO2: 95% 97% 95% 95%  Weight:      Height:        SpO2: 95 %  Wt Readings from Last 3 Encounters:  12/16/20 102.1 kg  05/11/20 109 kg  08/14/19 113.4 kg     Intake/Output Summary (Last 24 hours) at 12/20/2020 1746 Last data filed at 12/20/2020 1716 Gross per 24 hour  Intake 150 ml  Output 1200 ml  Net -1050 ml    Physical Exam:     Awake Alert, Oriented X 3, Normal affect No new F.N deficits,  .AT, Normal respiratory effort on room air, CTAB Irregularly irregular rhythm,No Gallops,Rubs or new Murmurs,  +ve B.Sounds, Abd Soft, No tenderness, No rebound, guarding or rigidity. No Cyanosis, No new Rash or bruise    I have personally reviewed the following:   Data Reviewed:  CBC Recent Labs  Lab 12/15/20 1651 12/17/20 0314 12/19/20 1418 12/20/20 0343  WBC 10.1 8.1  --  9.3  HGB 12.7* 12.5* 12.5* 12.4*  HCT 40.6 38.1* 39.5 37.5*  PLT 390 395  --  422*  MCV 92.1 89.9  --  88.9  MCH 28.8 29.5  --  29.4  MCHC 31.3 32.8  --  33.1  RDW 13.5 13.8  --  13.7    Chemistries  Recent Labs  Lab 12/15/20 1651 12/17/20 0314 12/20/20 0343  NA 134* 134* 132*  K 4.4 4.1 4.5  CL 103 103 98  CO2 20* 23 24  GLUCOSE 100* 113* 120*  BUN 14 16 20   CREATININE 1.04 1.21 1.21  CALCIUM 10.2 10.0 10.7*  MG  --  2.0  --   AST  --  12*  --   ALT  --  10  --   ALKPHOS  --  54  --   BILITOT  --  0.5  --    ------------------------------------------------------------------------------------------------------------------ No results for  input(s): CHOL, HDL, LDLCALC, TRIG, CHOLHDL, LDLDIRECT in the last 72 hours.  Lab Results  Component Value Date   HGBA1C 5.5 12/16/2020   ------------------------------------------------------------------------------------------------------------------ No results for input(s): TSH, T4TOTAL, T3FREE, THYROIDAB in the last 72 hours.  Invalid input(s): FREET3 ------------------------------------------------------------------------------------------------------------------ No results for input(s): VITAMINB12, FOLATE, FERRITIN, TIBC, IRON, RETICCTPCT in the last 72 hours.  Coagulation profile No results for input(s): INR, PROTIME in the last 168 hours.  No results for input(s): DDIMER in the last 72 hours.  Cardiac Enzymes No results for input(s): CKMB, TROPONINI, MYOGLOBIN in the last 168 hours.  Invalid  input(s): CK ------------------------------------------------------------------------------------------------------------------ No results found for: BNP  Micro Results Recent Results (from the past 240 hour(s))  Resp Panel by RT-PCR (Flu A&B, Covid) Nasopharyngeal Swab     Status: None   Collection Time: 12/15/20 10:30 PM   Specimen: Nasopharyngeal Swab; Nasopharyngeal(NP) swabs in vial transport medium  Result Value Ref Range Status   SARS Coronavirus 2 by RT PCR NEGATIVE NEGATIVE Final    Comment: (NOTE) SARS-CoV-2 target nucleic acids are NOT DETECTED.  The SARS-CoV-2 RNA is generally detectable in upper respiratory specimens during the acute phase of infection. The lowest concentration of SARS-CoV-2 viral copies this assay can detect is 138 copies/mL. A negative result does not preclude SARS-Cov-2 infection and should not be used as the sole basis for treatment or other patient management decisions. A negative result may occur with  improper specimen collection/handling, submission of specimen other than nasopharyngeal swab, presence of viral mutation(s) within the areas  targeted by this assay, and inadequate number of viral copies(<138 copies/mL). A negative result must be combined with clinical observations, patient history, and epidemiological information. The expected result is Negative.  Fact Sheet for Patients:  BloggerCourse.com  Fact Sheet for Healthcare Providers:  SeriousBroker.it  This test is no t yet approved or cleared by the [place] FDA and  has been authorized for detection and/or diagnosis of SARS-CoV-2 by FDA under an Emergency Use Authorization (EUA). This EUA will remain  in effect (meaning this test can be used) for the duration of the COVID-19 declaration under Section 564(b)(1) of the Act, 21 U.S.C.section 360bbb-3(b)(1), unless the authorization is terminated  or revoked sooner.       Influenza A by PCR NEGATIVE NEGATIVE Final   Influenza B by PCR NEGATIVE NEGATIVE Final    Comment: (NOTE) The Xpert Xpress SARS-CoV-2/FLU/RSV plus assay is intended as an aid in the diagnosis of influenza from Nasopharyngeal swab specimens and should not be used as a sole basis for treatment. Nasal washings and aspirates are unacceptable for Xpert Xpress SARS-CoV-2/FLU/RSV testing.  Fact Sheet for Patients: BloggerCourse.com  Fact Sheet for Healthcare Providers: SeriousBroker.it  This test is not yet approved or cleared by the [place] FDA and has been authorized for detection and/or diagnosis of SARS-CoV-2 by FDA under an Emergency Use Authorization (EUA). This EUA will remain in effect (meaning this test can be used) for the duration of the COVID-19 declaration under Section 564(b)(1) of the Act, 21 U.S.C. section 360bbb-3(b)(1), unless the authorization is terminated or revoked.  Performed at [facility], [address] [place], [place] [address]     Radiology Reports CT ANGIO HEAD W OR WO CONTRAST  Result  Date: 12/16/2020 CLINICAL DATA:  Weakness EXAM: CT ANGIOGRAPHY HEAD AND NECK TECHNIQUE: Multidetector CT imaging of the head and neck was performed using the standard protocol during bolus administration of intravenous contrast. Multiplanar CT image reconstructions and MIPs were obtained to evaluate the vascular anatomy. Carotid stenosis measurements (when applicable) are obtained utilizing NASCET criteria, using the distal internal carotid diameter as the denominator. CONTRAST:  17mL OMNIPAQUE IOHEXOL 350 MG/ML SOLN COMPARISON:  Head CT 12/15/2020 FINDINGS: CT HEAD FINDINGS Brain: There is no mass, hemorrhage or extra-axial collection. There is generalized atrophy without lobar predilection. There is no acute or chronic infarction. The brain parenchyma is normal. Skull: The visualized skull base, calvarium and extracranial soft tissues are normal. Sinuses/Orbits: No fluid levels or advanced mucosal thickening of the visualized paranasal sinuses. No mastoid or middle ear effusion. The  orbits are normal. CTA NECK FINDINGS SKELETON: There is no bony spinal canal stenosis. No lytic or blastic lesion. OTHER NECK: Normal pharynx, larynx and major salivary glands. No cervical lymphadenopathy. Unremarkable thyroid gland. UPPER CHEST: No pneumothorax or pleural effusion. No nodules or masses. AORTIC ARCH: There is calcific atherosclerosis of the aortic arch. There is no aneurysm, dissection or hemodynamically significant stenosis of the visualized portion of the aorta. Conventional 3 vessel aortic branching pattern. The visualized proximal subclavian arteries are widely patent. RIGHT CAROTID SYSTEM: No dissection, occlusion or aneurysm. Mild atherosclerotic calcification at the carotid bifurcation without hemodynamically significant stenosis. LEFT CAROTID SYSTEM: No dissection, occlusion or aneurysm. Mild atherosclerotic calcification at the carotid bifurcation without hemodynamically significant stenosis. VERTEBRAL  ARTERIES: Left dominant configuration. Both origins are clearly patent. There is no dissection, occlusion or flow-limiting stenosis to the skull base (V1-V3 segments). CTA HEAD FINDINGS POSTERIOR CIRCULATION: --Vertebral arteries: Normal V4 segments. --Inferior cerebellar arteries: Normal. --Basilar artery: Normal. --Superior cerebellar arteries: Normal. --Posterior cerebral arteries (PCA): Normal. ANTERIOR CIRCULATION: --Intracranial internal carotid arteries: Normal. --Anterior cerebral arteries (ACA): Normal. Both A1 segments are present. Patent anterior communicating artery (a-comm). --Middle cerebral arteries (MCA): Normal. VENOUS SINUSES: As permitted by contrast timing, patent. ANATOMIC VARIANTS: None Review of the MIP images confirms the above findings. IMPRESSION: 1. No emergent large vessel occlusion or high-grade stenosis of the head or neck. Aortic Atherosclerosis (ICD10-I70.0). Electronically Signed   By: Kevin  Herman M.D.   On: 12/16/2020 21:21   CT Head Wo Contrast  Result Date: 12/15/2020 CLINICAL DATA:  weakness EXAM: CT HEAD WITHOUT CONTRAST TECHNIQUE: Contiguous axial images were obtained from the base of the skull through the vertex without intravenous contrast. COMPARISON:  None. FINDINGS: Brain: No evidence of acute territorial infarction, hemorrhage, hydrocephalus,extra-axial collection or mass lesion/mass effect. There is dilatation the ventricles and sulci consistent with age-related atrophy. Low-attenuation changes in the deep white matter consistent with small vessel ischemia. Vascular: No hyperdense vessel or unexpected calcification. Skull: The skull is intact. No fracture or focal lesion identified. Sinuses/Orbits: The visualized paranasal sinuses and mastoid air cells are clear. The orbits and globes intact. Other: None IMPRESSION: No acute intracranial abnormality. Findings consistent with age related atrophy and chronic small vessel ischemia Electronically Signed   By: Bindu   Avutu M.D.   On: 12/15/2020 21:11   CT ANGIO NECK W OR WO CONTRAST  Result Date: 12/16/2020 CLINICAL DATA:  Weakness EXAM: CT ANGIOGRAPHY HEAD AND NECK TECHNIQUE: Multidetector CT imaging of the head and neck was performed using the standard protocol during bolus administration of intravenous contrast. Multiplanar CT image reconstructions and MIPs were obtained to evaluate the vascular anatomy. Carotid stenosis measurements (when applicable) are obtained utilizing NASCET criteria, using the distal internal carotid diameter as the denominator. CONTRAST:  [facility] [address] [place] [facility]36[facility] [facility]25[facility] Endoscopy CenterXOL 350 MG/ML SOLN COMPARISON:  Head CT 12/15/2020 FINDINGS: CT HEAD FINDINGS Brain: There is no mass, hemorrhage or extra-axial collection. There is generalized atrophy without lobar predilection. There is no acute or chronic infarction. The brain parenchyma is normal. Skull: The visualized skull base, calvarium and extracranial soft tissues are normal. Sinuses/Orbits: No fluid levels or advanced mucosal thickening of the visualized paranasal sinuses. No mastoid or middle ear effusion. The orbits are normal. CTA NECK FINDINGS SKELETON: There is no bony spinal canal stenosis. No lytic or blastic lesion. OTHER NECK: Normal pharynx, larynx and major salivary glands. No cervical lymphadenopathy. Unremarkable thyroid gland. UPPER CHEST: No pneumothorax or pleural effusion. No nodules or masses. AORTIC ARCH: There is  calcific atherosclerosis of the aortic arch. There is no aneurysm, dissection or hemodynamically significant stenosis of the visualized portion of the aorta. Conventional 3 vessel aortic branching pattern. The visualized proximal subclavian arteries are widely patent. RIGHT CAROTID SYSTEM: No dissection, occlusion or aneurysm. Mild atherosclerotic calcification at the carotid bifurcation without hemodynamically significant stenosis. LEFT CAROTID SYSTEM: No dissection, occlusion or aneurysm. Mild atherosclerotic calcification at the  carotid bifurcation without hemodynamically significant stenosis. VERTEBRAL ARTERIES: Left dominant configuration. Both origins are clearly patent. There is no dissection, occlusion or flow-limiting stenosis to the skull base (V1-V3 segments). CTA HEAD FINDINGS POSTERIOR CIRCULATION: --Vertebral arteries: Normal V4 segments. --Inferior cerebellar arteries: Normal. --Basilar artery: Normal. --Superior cerebellar arteries: Normal. --Posterior cerebral arteries (PCA): Normal. ANTERIOR CIRCULATION: --Intracranial internal carotid arteries: Normal. --Anterior cerebral arteries (ACA): Normal. Both A1 segments are present. Patent anterior communicating artery (a-comm). --Middle cerebral arteries (MCA): Normal. VENOUS SINUSES: As permitted by contrast timing, patent. ANATOMIC VARIANTS: None Review of the MIP images confirms the above findings. IMPRESSION: 1. No emergent large vessel occlusion or high-grade stenosis of the head or neck. Aortic Atherosclerosis (ICD10-I70.0). Electronically Signed   By: Ulyses Jarred M.D.   On: 12/16/2020 21:21   CT Angio Chest PE W and/or Wo Contrast  Result Date: 12/19/2020 CLINICAL DATA:  High probability of pulmonary embolus. EXAM: CT ANGIOGRAPHY CHEST WITH CONTRAST TECHNIQUE: Multidetector CT imaging of the chest was performed using the standard protocol during bolus administration of intravenous contrast. Multiplanar CT image reconstructions and MIPs were obtained to evaluate the vascular anatomy. CONTRAST:  38mL OMNIPAQUE IOHEXOL 350 MG/ML SOLN COMPARISON:  December 15, 2020. FINDINGS: Cardiovascular: Satisfactory opacification of the pulmonary arteries to the segmental level. No evidence of pulmonary embolism. Mild cardiomegaly is noted. Coronary artery calcifications are noted. No pericardial effusion. Atherosclerosis of thoracic aorta is noted without aneurysm formation. Mediastinum/Nodes: No enlarged mediastinal, hilar, or axillary lymph nodes. Thyroid gland, trachea, and  esophagus demonstrate no significant findings. Lungs/Pleura: Lungs are clear. No pleural effusion or pneumothorax. Upper Abdomen: Cholelithiasis. Musculoskeletal: No chest wall abnormality. No acute or significant osseous findings. Review of the MIP images confirms the above findings. IMPRESSION: 1. No definite evidence of pulmonary embolus. 2. Coronary artery calcifications are noted suggesting coronary artery disease. 3. Cholelithiasis. 4. Aortic atherosclerosis. Aortic Atherosclerosis (ICD10-I70.0). Electronically Signed   By: Marijo Conception M.D.   On: 12/19/2020 19:38   MR BRAIN WO CONTRAST  Result Date: 12/16/2020 CLINICAL DATA:  Lower extremity weakness.  Renal cell carcinoma. EXAM: MRI HEAD WITHOUT CONTRAST TECHNIQUE: Multiplanar, multiecho pulse sequences of the brain and surrounding structures were obtained without intravenous contrast. COMPARISON:  Brain MRI 07/06/2016 FINDINGS: Brain: There is a small focus of acute ischemia in the superior left hemisphere. Focus of chronic microhemorrhage in the left basal ganglia. No acute hemorrhage. There is multifocal hyperintense T2-weighted signal within the white matter. Generalized volume loss without a clear lobar predilection. The midline structures are normal. Vascular: Major flow voids are preserved. Skull and upper cervical spine: Normal calvarium and skull base. Visualized upper cervical spine and soft tissues are normal. Unchanged occipital sebaceous cyst. Sinuses/Orbits:No paranasal sinus fluid levels or advanced mucosal thickening. No mastoid or middle ear effusion. Normal orbits. IMPRESSION: 1. Small focus of acute ischemia in the superior left hemisphere. No hemorrhage or mass effect. 2. Findings of chronic microvascular ischemia and generalized volume loss . 3. Assessment for metastatic disease limited without intravenous contrast. Electronically Signed   By: Ulyses Jarred M.D.   On: 12/16/2020 01:01  MR THORACIC SPINE W WO CONTRAST  Result  Date: 12/16/2020 CLINICAL DATA:  Renal cell carcinoma. Bilateral lower extremity weakness. EXAM: MRI THORACIC AND LUMBAR SPINE WITHOUT AND WITH CONTRAST TECHNIQUE: Multiplanar and multiecho pulse sequences of the thoracic and lumbar spine were obtained without and with intravenous contrast. CONTRAST:  2mL GADAVIST GADOBUTROL 1 MMOL/ML IV SOLN COMPARISON:  CT thoracic and lumbar spine 12/15/2020 FINDINGS: MRI THORACIC SPINE FINDINGS Alignment:  Physiologic. Vertebrae: There is signal change and mild contrast enhancement at the endplates of D34-534, X33443 and T10-11. No discrete masslike lesion. Cord:  Normal Paraspinal and other soft tissues: Limited assessment of the lungs. Disc levels: No disc herniation or spinal canal stenosis. MRI LUMBAR SPINE FINDINGS Segmentation:  Standard. Alignment: Grade 1 retrolisthesis at L3-4 and grade 1 anterolisthesis at L4-5. Vertebrae: Multilevel degenerative endplate signal changes. No abnormal contrast enhancement. Conus medullaris: Extends to the L1 level and appears normal. 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 Disc levels: L1-L2: Normal disc space and facet joints. No spinal canal stenosis. No neural foraminal stenosis. L2-L3: Normal disc space and facet joints. No spinal canal stenosis. No neural foraminal stenosis. L3-L4: Disc space narrowing with right asymmetric disc bulge. No spinal canal stenosis. Moderate right and mild left neural foraminal stenosis. L4-L5: Moderate facet hypertrophy with small disc bulge. No spinal canal stenosis. No neural foraminal stenosis. L5-S1: Normal disc space and facet joints. No spinal canal stenosis. No neural foraminal stenosis. Visualized sacrum: Normal. IMPRESSION: 1. 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 2. No evidence of metastatic disease to the thoracic or lumbar spine. 3. Mild edema and contrast enhancement at the endplates at D34-534, X33443 and T10-11 without discrete masslike lesion. This is favored to be degenerative. 4. Moderate right L3-4 neural foraminal stenosis. Electronically Signed   By: Ulyses Jarred M.D.   On: 12/16/2020 01:10   MR Lumbar Spine W Wo Contrast  Result Date: 12/16/2020 CLINICAL DATA:  Renal cell carcinoma. Bilateral lower extremity weakness. EXAM: MRI THORACIC AND LUMBAR SPINE WITHOUT AND WITH CONTRAST TECHNIQUE: Multiplanar and multiecho pulse sequences of the thoracic and lumbar spine were obtained without and with intravenous contrast. CONTRAST:  22mL GADAVIST GADOBUTROL 1 MMOL/ML IV SOLN COMPARISON:  CT thoracic and lumbar spine 12/15/2020 FINDINGS: MRI THORACIC SPINE FINDINGS Alignment:  Physiologic. Vertebrae: There is signal change and mild contrast enhancement at the endplates of D34-534, X33443 and T10-11. No discrete masslike lesion. Cord:  Normal Paraspinal and other soft tissues: Limited assessment of the lungs. Disc levels: No disc herniation or spinal canal stenosis. MRI LUMBAR SPINE FINDINGS Segmentation:  Standard. Alignment: Grade 1 retrolisthesis at L3-4 and grade 1 anterolisthesis at L4-5. Vertebrae: Multilevel degenerative endplate signal changes. No abnormal contrast enhancement. Conus medullaris: Extends to the L1 level and appears normal. 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 Disc levels: L1-L2: Normal disc space and facet joints. No spinal canal stenosis. No neural foraminal stenosis. L2-L3: Normal disc space and facet joints. No spinal canal stenosis. No neural foraminal stenosis. L3-L4: Disc space narrowing with right asymmetric disc bulge. No spinal canal stenosis. Moderate right and mild left neural foraminal stenosis.  L4-L5: Moderate facet hypertrophy with small disc bulge. No  spinal canal stenosis. No neural foraminal stenosis. L5-S1: Normal disc space and facet joints. No spinal canal stenosis. No neural foraminal stenosis. Visualized sacrum: Normal. IMPRESSION: 1. 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 2. No evidence of metastatic disease to the thoracic or lumbar spine. 3. Mild edema and contrast enhancement at the endplates at D34-534, X33443 and T10-11 without discrete masslike lesion. This is favored to be degenerative. 4. Moderate right L3-4 neural foraminal stenosis. Electronically Signed   By: Ulyses Jarred M.D.   On: 12/16/2020 01:10   CT CHEST ABDOMEN PELVIS W CONTRAST  Result Date: 12/15/2020 CLINICAL DATA:  Renal cell carcinoma abdominal mass. started Chemo 12/22 sent here by [facility] center for further evaluation EXAM: CT CHEST, ABDOMEN, AND PELVIS WITH CONTRAST TECHNIQUE: Multidetector CT imaging of the chest, abdomen and pelvis was performed following the standard protocol during bolus administration of intravenous contrast. CONTRAST:  163mL OMNIPAQUE IOHEXOL 300 MG/ML  SOLN COMPARISON:  MRI abdomen 07/20/2015, CT abdomen pelvis 07/06/2015. FINDINGS: CT CHEST FINDINGS Cardiovascular: Left atrial enlargement. The remainder of the heart is normal in caliber. For. No significant pericardial effusion. The thoracic aorta is normal in caliber. At least moderate atherosclerotic plaque of the thoracic aorta. At least moderate three-vessel coronary artery calcifications. The main pulmonary artery is normal in caliber. No central pulmonary embolus. Mediastinum/Nodes: No enlarged mediastinal, hilar, or axillary lymph nodes. Thyroid gland, trachea, and esophagus demonstrate no significant findings. Lungs/Pleura: Bilateral lower lobe subsegmental atelectasis. Lingular atelectasis. No focal consolidation.  There is a round solid 4 mm nodule within the left upper lobe. Micronodule within the right upper lobe (5:38). Micronodule within the right lower lobe (5:80). Subpleural micronodule within the right upper lobe (5:64). No pulmonary mass. No pleural effusion. No pneumothorax. Musculoskeletal: Slightly asymmetric gynecomastia, right greater than left. No suspicious lytic or blastic osseous lesions. No acute displaced fracture. Multilevel degenerative changes of the spine. Please see separately dictated CT thoracolumbar spine 12/15/2020. CT ABDOMEN PELVIS FINDINGS Hepatobiliary: No focal liver abnormality. At least 2.8 cm peripherally calcified gallstone within the gallbladder lumen. No associated gallbladder wall thickening or pericholecystic fluid. No biliary dilatation. Pancreas: No focal lesion. Normal pancreatic contour. No surrounding inflammatory changes. No main pancreatic ductal dilatation. Spleen: Normal in size without focal abnormality. Adrenals/Urinary Tract: No adrenal nodule bilaterally. Status post left nephrectomy. The right kidney enhances homogeneous Lea. No nephrolithiasis. No hydronephrosis. No hydroureter. The urinary bladder is unremarkable. Status post Stomach/Bowel: Stomach is within normal limits. No evidence of small bowel wall thickening or dilatation. Diffuse sigmoid and descending colon diverticulosis this cysts with slightly increased trace pericolonic fat stranding but no definite bowel wall thickening. The appendix appears normal. Vascular/Lymphatic: No abdominal aorta or iliac aneurysm. Severe calcified and noncalcified atherosclerotic plaque of the aorta and its branches. Suggestion of prior retroperitoneal lymph node dissection. Heterogeneous lobulated 7.6 x 7.7 x 11.9 cm retroperitoneal mass that is noted to be inseparable from the inferior vena cava, third portion of the duodenum, aorta, and origin/proximal inferior mesenteric artery. No pelvic or inguinal lymphadenopathy. The mass  is noted to encase the inferior vena cava. Intraluminal extension of the mass not excluded. No findings suggest extension of the mass into the right renal vein or right cardiac atrium. Reproductive: Prostate is unremarkable. Other: Trace simple free fluid within the abdomen pelvis. No intraperitoneal free gas. No organized fluid collection. Musculoskeletal: No abdominal wall hernia or abnormality.  No suspicious lytic or blastic osseous lesions. No acute displaced fracture. Multilevel degenerative changes of the spine. Please see separately dictated CT thoracolumbar spine 12/15/2020. IMPRESSION: 1. A 7.6 x 7.7 x 11.9 cm heterogeneous retroperitoneal mass is inseparable from the inferior vena cava, third portion of the duodenum, aorta, and origin/proximal inferior mesenteric artery. The mass is noted to encase the inferior vena cava with intraluminal extension not excluded. 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 2. Left atrial enlargement. 3. Diffuse descending colon and sigmoid diverticulosis with no CT findings of definite acute diverticulitis. 4. Cholelithiasis with no CT findings of acute cholecystitis or choledocholithiasis. 5. Slightly asymmetric gynecomastia, right greater than left. Recommend mammographic evaluation. 6.  Aortic Atherosclerosis (ICD10-I70.0). 7. Please see separately dictated CT thoracolumbar spine 12/15/2020. Electronically Signed   By: Iven Finn M.D.   On: 12/15/2020 21:37   CT T-SPINE NO CHARGE  Result Date: 12/15/2020 CLINICAL DATA:  Bilateral lower extremity weakness. EXAM: CT Thoracic and Lumbar spine without contrast TECHNIQUE: Multiplanar CT images of the thoracic and lumbar spine were reconstructed from contemporary CT of the Chest, Abdomen, and Pelvis CONTRAST:  No additional contrast administered COMPARISON:  None FINDINGS: CT THORACIC SPINE FINDINGS Alignment: Mild dextroscoliosis. Vertebrae: No  acute fracture or focal pathologic process. Paraspinal and other soft tissues: Please see report for concomitant CT of the chest, abdomen and pelvis. Disc levels: There is no spinal canal stenosis. CT LUMBAR SPINE FINDINGS Segmentation: Standard Alignment: Normal Vertebrae: No acute fracture or focal pathologic process. Paraspinal and other soft tissues: Retroperitoneal mass better characterized on concomitant CT of the chest, abdomen and pelvis. Disc levels: There is moderate L4-5 facet arthrosis. No lumbar spinal canal stenosis or neural impingement. IMPRESSION: 1. No acute abnormality of the thoracic or lumbar spine. 2. Retroperitoneal mass better characterized on concomitant CT of the chest, abdomen and pelvis. Electronically Signed   By: Ulyses Jarred M.D.   On: 12/15/2020 21:20   CT L-SPINE NO CHARGE  Result Date: 12/15/2020 CLINICAL DATA:  Bilateral lower extremity weakness. EXAM: CT Thoracic and Lumbar spine without contrast TECHNIQUE: Multiplanar CT images of the thoracic and lumbar spine were reconstructed from contemporary CT of the Chest, Abdomen, and Pelvis CONTRAST:  No additional contrast administered COMPARISON:  None FINDINGS: CT THORACIC SPINE FINDINGS Alignment: Mild dextroscoliosis. Vertebrae: No acute fracture or focal pathologic process. Paraspinal and other soft tissues: Please see report for concomitant CT of the chest, abdomen and pelvis. Disc levels: There is no spinal canal stenosis. CT LUMBAR SPINE FINDINGS Segmentation: Standard Alignment: Normal Vertebrae: No acute fracture or focal pathologic process. Paraspinal and other soft tissues: Retroperitoneal mass better characterized on concomitant CT of the chest, abdomen and pelvis. Disc levels: There is moderate L4-5 facet arthrosis. No lumbar spinal canal stenosis or neural impingement. IMPRESSION: 1. No acute abnormality of the thoracic or lumbar spine. 2. Retroperitoneal mass better characterized on concomitant CT of the chest,  abdomen and pelvis. Electronically Signed   By: Ulyses Jarred M.D.   On: 12/15/2020 21:20   DG Chest Port 1V same Day  Result Date: 12/19/2020 CLINICAL DATA:  Chest pain EXAM: PORTABLE CHEST 1 VIEW COMPARISON:  07/13/2015 FINDINGS: Heart and mediastinal contours are within normal limits. No focal opacities or effusions. No acute bony abnormality. IMPRESSION: No active disease. Electronically Signed   By: Rolm Baptise M.D.   On: 12/19/2020 11:59   ECHOCARDIOGRAM COMPLETE BUBBLE STUDY  Result Date: 12/16/2020  ECHOCARDIOGRAM REPORT   Patient Name:   Evan Kirk Date of Exam: 12/16/2020 Medical Rec #:  [id number]         Height:       66.0 in Accession #:    [id number]        Weight:       225.0 lb Date of Birth:  04/17/1941         BSA:          2.102 m Patient Age:    52 years          BP:           116/65 mmHg Patient Gender: M                 HR:           73 bpm. Exam Location:  Inpatient Procedure: 2D Echo and Saline Contrast Bubble Study Indications:    Stroke  History:        Patient has prior history of Echocardiogram examinations, most                 recent 08/12/2015. CHF, Arrythmias:Atrial Fibrillation; Risk                 Factors:Hypertension.  Sonographer:    Mikki Santee RDCS (AE) Referring Phys: [place]  1. Left ventricular ejection fraction, by estimation, is 60 to 65%. The left ventricle has normal function. The left ventricle has no regional wall motion abnormalities. There is mild left ventricular hypertrophy. Left ventricular diastolic parameters are indeterminate.  2. Right ventricular systolic function is mildly reduced. The right ventricular size is mildly enlarged.  3. Left atrial size was severely dilated.  4. The mitral valve is abnormal. Trivial mitral valve regurgitation. No evidence of mitral stenosis. Moderate mitral annular calcification.  5. The aortic valve is tricuspid. There is moderate calcification of the aortic valve. Aortic valve  regurgitation is not visualized. Mild aortic valve stenosis.  6. Aortic dilatation noted. There is dilatation of the ascending aorta, measuring 44 mm.  7. Agitated saline contrast bubble study was negative, with no evidence of any interatrial shunt. FINDINGS  Left Ventricle: Left ventricular ejection fraction, by estimation, is 60 to 65%. The left ventricle has normal function. The left ventricle has no regional wall motion abnormalities. The left ventricular internal cavity size was normal in size. There is  mild left ventricular hypertrophy. Left ventricular diastolic parameters are indeterminate. Right Ventricle: The right ventricular size is mildly enlarged. Right vetricular wall thickness was not well visualized. Right ventricular systolic function is mildly reduced. Left Atrium: Left atrial size was severely dilated. Right Atrium: Right atrial size was normal in size. Pericardium: There is no evidence of pericardial effusion. Mitral Valve: The mitral valve is abnormal. Moderate mitral annular calcification. Trivial mitral valve regurgitation. No evidence of mitral valve stenosis. Tricuspid Valve: The tricuspid valve is normal in structure. Tricuspid valve regurgitation is trivial. Aortic Valve: The aortic valve is tricuspid. There is moderate calcification of the aortic valve. Aortic valve regurgitation is not visualized. Mild aortic stenosis is present. Aortic valve mean gradient measures 8.2 mmHg. Aortic valve peak gradient measures 18.6 mmHg. Aortic valve area, by VTI measures 1.73 cm. Pulmonic Valve: The pulmonic valve was not well visualized. Pulmonic valve regurgitation is not visualized. Aorta: The aortic root is normal in size and structure and aortic dilatation noted. There is dilatation of the ascending aorta, measuring 44 mm. Venous: The inferior vena  cava was not well visualized. IAS/Shunts: The interatrial septum was not well visualized. Agitated saline contrast was given intravenously to  evaluate for intracardiac shunting. Agitated saline contrast bubble study was negative, with no evidence of any interatrial shunt.  LEFT VENTRICLE PLAX 2D LVIDd:         3.80 cm  Diastology LVIDs:         2.70 cm  LV e' medial:   9.36 cm/s LV PW:         1.30 cm  LV E/e' medial: 0.9 LV IVS:        1.30 cm LVOT diam:     2.50 cm LV SV:         77 LV SV Index:   36 LVOT Area:     4.91 cm  RIGHT VENTRICLE RV S prime:     10.20 cm/s TAPSE (M-mode): 1.1 cm LEFT ATRIUM              Index       RIGHT ATRIUM           Index LA diam:        6.20 cm  2.95 cm/m  RA Area:     16.50 cm LA Vol (A2C):   114.0 ml 54.22 ml/m RA Volume:   37.60 ml  17.88 ml/m LA Vol (A4C):   126.0 ml 59.93 ml/m LA Biplane Vol: 120.0 ml 57.08 ml/m  AORTIC VALVE AV Area (Vmax):    1.62 cm AV Area (Vmean):   1.80 cm AV Area (VTI):     1.73 cm AV Vmax:           215.80 cm/s AV Vmean:          132.200 cm/s AV VTI:            0.443 m AV Peak Grad:      18.6 mmHg AV Mean Grad:      8.2 mmHg LVOT Vmax:         71.10 cm/s LVOT Vmean:        48.400 cm/s LVOT VTI:          0.156 m LVOT/AV VTI ratio: 0.35  AORTA Ao Root diam: 3.70 cm MV E velocity: 8.49 cm/s  TRICUSPID VALVE                           TR Peak grad:   21.2 mmHg                           TR Vmax:        230.00 cm/s                            SHUNTS                           Systemic VTI:  0.16 m                           Systemic Diam: 2.50 cm Epifanio Lescheshristopher Schumann MD Electronically signed by Epifanio Lescheshristopher Schumann MD Signature Date/Time: 12/16/2020/11:43:55 AM    Final      Time Spent in minutes  30     Laverna PeaceShayla D Monna Crean M.D on 12/20/2020 at 5:46 PM  To page go to [email or web address] - password [email or web address]

## 2020-12-21 DIAGNOSIS — I482 Chronic atrial fibrillation, unspecified: Secondary | ICD-10-CM | POA: Diagnosis not present

## 2020-12-21 LAB — BASIC METABOLIC PANEL
Anion gap: 7 (ref 5–15)
BUN: 19 mg/dL (ref 8–23)
CO2: 27 mmol/L (ref 22–32)
Calcium: 10.5 mg/dL — ABNORMAL HIGH (ref 8.9–10.3)
Chloride: 100 mmol/L (ref 98–111)
Creatinine, Ser: 1.14 mg/dL (ref 0.61–1.24)
GFR, Estimated: >60 mL/min (ref >60)
Glucose, Bld: 111 mg/dL — ABNORMAL HIGH (ref 70–99)
Potassium: 4.6 mmol/L (ref 3.5–5.1)
Sodium: 134 mmol/L — ABNORMAL LOW (ref 135–145)

## 2020-12-21 NOTE — Progress Notes (Signed)
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  He is recently started immunotherapy at [facility] using pembrolizumab/axitinib.  He presented to the ED 12/29 with leg weakness tingling and burning for 48 hours with the right leg greater than left leg weakness as well.  In the ED CT head was unremarkable. CT thoracic lumbar spine showed mass as described below. MRI brain shows a punctate acute infarct in the superior left frontal hemisphere, close to the motor strip. No bleeding. No mass-effect. MRI of the thoracic and lumbar spine with no evidence of spinal cord infarct.  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 The patient has been evalated by PT/OT. Their recommendation is for CIR. The patient is awaiting insurance authorization.  Consultants  . PCCM . Neurology . Rehab medicine  Procedures  . None  Antibiotics   Anti-infectives (From admission, onward)   None     Subjective  The patient is lying in bed. No new complaints.  Objective   Vitals:  Vitals:   12/21/20 0819 12/21/20 1125  BP: (!) 120/50 134/82  Pulse: (!) 52 (!) 57  Resp: 18 20  Temp: (!) 96.8 F (36 C) (!) 95.9 F (35.5 C)  SpO2: 99% 100%   Exam:  Constitutional:  The patient is awake, alert, and oriented x 3. No acute distress. Respiratory:  . No increased work of breathing. . No wheezes, rales, or rhonchi . No tactile fremitus Cardiovascular:  . Regular rate and rhythm . No murmurs, ectopy, or gallups. . No lateral PMI. No thrills. Abdomen:  . Abdomen is soft, non-tender, non-distended . No hernias, masses, or organomegaly . Normoactive  bowel sounds.  Musculoskeletal:  . No cyanosis, clubbing, or edema Skin:  . No rashes, lesions, ulcers . palpation of skin: no induration or nodules Neurologic:  . CN 2-12 intact . Sensation all 4 extremities intact . Weakness of lower extremities bilaterally. Left greater than right. Psychiatric:  . Mental status o Mood, affect appropriate o Orientation to person, place, time  . judgment and insight appear intact I have personally reviewed the following:   Today's Data  . Vitals, BMP  Imaging  . CTA head and neck . CXR . CTA chest - pending.  Cardiology Data  . Echocardiogram . EKG  Scheduled Meds: . apixaban  5 mg Oral BID  . atorvastatin  40 mg Oral Daily  . cholecalciferol  2,000 Units Oral Daily  . hydrocortisone   Topical BID  . ketoconazole  1 application Topical Once per day on Mon Thu  . multivitamin with minerals  1 tablet Oral Daily  . pregabalin  25 mg Oral BID  . senna  1 tablet Oral QHS  . vitamin B-12  2,000 mcg Oral Daily   Continuous Infusions:   Principal Problem:   Acute ischemic stroke [facility]) Active Problems:   Atrial fibrillation (HCC)   Clear cell renal cell carcinoma (HCC)   Neuropathy   Hypercalcemia   LOS: 5 days   A & P  Small subacute ischemic stroke left motor strip: Explains right lower extremity weakness. The patient is chronically on Eliquis for atrial fibrillation. Neuro following. Stroke work-up to be completed  with TTE and CTA of head and neck. CTAhead and neck demonstrates no large vessel occlusion or high grade stenosis of the head or neck. Echocardiogram with Bubble study was negative for source of embolism and bubble study did not demonstrate shunt.  Chest pain with dyspnea: Work up negative for any etiology. No resolved. The patient was saturating 94% on room air. CXR demonstrated no acute pathology. EKG demonstrated atrial fibrillation with controlled rates unchanged from most recent EKG from May 2021. Troponin was 7. CTA  chest demonstrated no pulmonary embolus. H&H was unremarkable.  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 Pt will follow up with oncology at the [facility] as is his wish.   Bilateral lower extremity progressive paresthesias and weakness: Due to multifocal mononeuropathy per neurology. They have recommended EMG and nerve conduction studies as outpatient.   Recurrent renal cell carcinoma: Followed by Oncology at [facility], but actually receiving chemo via Oncologist at [place], [place] (Dr. Thedore Kirk). The patient has been in communication with his oncologist who does not feel that the retroperitoneal  mass could be related to RCC. Phone number for Oncologist on call at the [place] ( Dr. Ervin Kirk [phone].  Chronic atrial fibrillation on Eliquis: Held. The patient has been transitioned to heparin drip. Rate controlled with a couple of episodes of bradycardia.  Obstructive Sleep apnea: Dependent upon CPAP nightly  I have seen and examined this patient myself. I have spent 32 minutes in his evaluation and care.  Code Status: FULL CODE Family Communication: Spoke with son and patient at bedside Status is: Inpatient  Remains inpatient appropriate because:Inpatient level of care appropriate due to severity of illness and lack of a safe discharge.  Dispo: The patient is from: Home  Anticipated d/c is to: CIR/SNF  Anticipated d/c date is: 2 days  Patient currently is not medically stable to d/c.  Evan Orengo, DO [facility] Direct contact: see [email or web address]  7PM-7AM contact night coverage as above 12/21/2020, 2:54 PM  LOS: 1 day   ADDENDUM: CXR demonstrated no acute pathology. EKG was unchanged. It continues to demonstrate atrial fibrillation with a controlled rate. Troponin was 7. CTA chest has been ordered.

## 2020-12-21 NOTE — Plan of Care (Signed)
  Problem: Education: Goal: Knowledge of disease or condition will improve Outcome: Progressing Goal: Knowledge of secondary prevention will improve Outcome: Progressing   

## 2020-12-21 NOTE — Plan of Care (Signed)
  Problem: Education: Goal: Knowledge of disease or condition will improve Outcome: Progressing Goal: Knowledge of secondary prevention will improve Outcome: Progressing Goal: Knowledge of patient specific risk factors addressed and post discharge goals established will improve Outcome: Progressing   Problem: Coping: Goal: Will verbalize positive feelings about self Outcome: Progressing Goal: Will identify appropriate support needs Outcome: Progressing   Problem: Self-Care: Goal: Ability to participate in self-care as condition permits will improve Outcome: Progressing   Problem: Nutrition: Goal: Risk of aspiration will decrease Outcome: Progressing   Problem: Ischemic Stroke/TIA Tissue Perfusion: Goal: Complications of ischemic stroke/TIA will be minimized Outcome: Progressing   

## 2020-12-21 NOTE — Progress Notes (Signed)
Patient stated he can place himself on CPAP tonight when he is ready.

## 2020-12-21 NOTE — Discharge Instructions (Signed)

## 2020-12-21 NOTE — Progress Notes (Signed)
Occupational Therapy Treatment Patient Details Name: Evan Kirk MRN: [id number] DOB: 10/11/1941 Today's Date: 12/21/2020    History of present illness Pt presented to ED with c/o LE weakness, tingling, and burning as well as pain across his back.  MRI of brain showed small acute ischemic stroke at top of Lt motor strip.  Suspect bil. LE numbness and tingling is due to neuropathy, which is chronic, but has worsed due to immunotherapy for CA vs GBS.   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 On arrival to session, pt quite lethargic and not eaily awoken. A variety of noxious stimuli were used with little success. Pt was then assisted to the bed with total A +2. Once EOB, pt became more alert and interactive- able to follow one step commands and appropriately converse with therapist. Pt then completed x2 sit <> stands with mod A +2 and was able to pivot over to recliner. He required support at RLE to prevent knee buckling during transfer. D/c recs remain appropriate for CIR. Will continue to follow per POC listed below.    Follow Up Recommendations  CIR;Supervision/Assistance - 24 hour    Equipment Recommendations  3 in 1 bedside commode    Recommendations for Other Services Rehab consult    Precautions / Restrictions Precautions Precautions: Fall;Other (comment) Precaution Comments: bradycardia Restrictions Weight Bearing Restrictions: No       Mobility Bed Mobility Overal bed mobility: Needs Assistance Bed Mobility: Sidelying to Sit   Sidelying to sit: Total assist;+2 for physical assistance;HOB elevated       General bed mobility comments: Assist of 2 to get pt to EOB due to decreased arousal and not waking up; assist with LEs and  trunk. Able to reposition self to scoot forward once upright.  Transfers Overall transfer level: Needs assistance Equipment used: Rolling walker (2 wheeled) Transfers: Sit to/from Stand Sit to Stand: Min assist;Mod assist;+2 physical assistance;From elevated surface Stand pivot transfers: Mod assist;+2 physical assistance       General transfer comment: Assist of 2 to stand from EOB with cues for hand placement; Right knee support needed during transition due to buckling and difficulty extending. Performed x2. BLEs locked out into knee hyperextension for support. Able to takea few steps to get from bed to chair with Mod A for right knee stability, RW management and balance. narrow BOS and flexed trunk.    Balance Overall balance assessment: Needs assistance Sitting-balance support: Feet supported;No upper extremity supported Sitting balance-Leahy Scale: Fair Sitting balance - Comments: supervision during MMT   Standing balance support: During functional activity Standing balance-Leahy Scale: Poor Standing balance comment: reliant on BUE support of RW; worked on weight shifting, toe tapping to offload LEs with therapist supporting right knee due to buckling with WB.                           ADL either performed or assessed with clinical judgement   ADL Overall ADL's : Needs assistance/impaired     Grooming: Minimal assistance;Sitting                   Toilet Transfer: Moderate assistance;+2 for physical assistance;+2 for safety/equipment;Stand-pivot;BSC Toilet Transfer Details (indicate cue type and reason):  pt requires one therapist assisting guiding RW and hips on L side and one therapist on R side to maintain knee extension         Functional mobility during ADLs: Moderate assistance;+2 for physical assistance;+2 for safety/equipment;Rolling walker General ADL Comments: pt with decreased alertness at start of session requiring max multimodal cues and  mobility initation to wake up. knee buckling limits safe ADL mobility progression     Vision Patient Visual Report: No change from baseline Vision Assessment?: No apparent visual deficits   Perception     Praxis      Cognition Arousal/Alertness: Lethargic Behavior During Therapy: WFL for tasks assessed/performed Overall Cognitive Status: Impaired/Different from baseline Area of Impairment: Problem solving;Following commands;Safety/judgement;Attention                   Current Attention Level: Sustained   Following Commands: Follows one step commands with increased time Safety/Judgement: Decreased awareness of safety;Decreased awareness of deficits Awareness: Emergent Problem Solving: Slow processing;Requires verbal cues;Difficulty sequencing General Comments: Initially very lethargic and sleepy; took a while to get pt to wake up from sleeping. Blankly staring at therapists and not very verbal at first. Repetition needed to follow commands. Once pt was more alert, he was able to consistently follow one step commands and joke around and laugh with therapists.        Exercises     Shoulder Instructions       General Comments Bradycardic with HR as low as 48, some dizziness reported during this time. BP stable and responds appropriately to exercise/activity.    Pertinent Vitals/ Pain       Pain Assessment: Faces Faces Pain Scale: Hurts little more Pain Location: low back esp with movement Pain Descriptors / Indicators: Grimacing;Guarding Pain Intervention(s): Limited activity within patient's tolerance;Monitored during session;Repositioned  Home Living                                          Prior Functioning/Environment              Frequency  Min 2X/week        Progress Toward Goals  OT Goals(current goals can now be found in the care plan section)  Progress towards OT goals: Progressing toward goals  Acute Rehab OT  Goals Patient Stated Goal: to be able to do more for myself OT Goal Formulation: With patient/family Time For Goal Achievement: 12/30/20 Potential to Achieve Goals: Good  Plan Discharge plan remains appropriate    Co-evaluation    PT/OT/SLP Co-Evaluation/Treatment: Yes Reason for Co-Treatment: For patient/therapist safety;To address functional/ADL transfers PT goals addressed during session: Mobility/safety with mobility;Balance OT goals addressed during session: ADL's and self-care;Proper use of Adaptive equipment and DME;Strengthening/ROM      AM-PAC OT "6 Clicks" Daily Activity     Outcome Measure   Help from another person eating meals?: None Help from another person taking care of personal grooming?: A Little Help from another person toileting, which includes using toliet, bedpan, or urinal?: A Lot Help from another person bathing (including washing, rinsing, drying)?: A Lot Help from another person to put on and taking off regular upper body clothing?: A Little Help from another person to put on and taking off regular lower body clothing?: A Lot 6 Click Score: 16    End of Session Equipment Utilized During Treatment: Gait belt;Rolling walker  OT Visit Diagnosis:  Unsteadiness on feet (R26.81);Pain;Other abnormalities of gait and mobility (R26.89);Other symptoms and signs involving the nervous system (R29.898) Pain - part of body:  (back)   Activity Tolerance Patient tolerated treatment well   Patient Left in chair;with call bell/phone within reach;with chair alarm set;with family/visitor present   Nurse Communication Mobility status        Time: 1102-1140 OT Time Calculation (min): 38 min  Charges: OT General Charges $OT Visit: 1 Visit OT Treatments $Self Care/Home Management : 8-22 mins  Zenovia Jarred, MSOT, OTR/L [facility] Office Number: [phone] Pager: [phone]  Zenovia Jarred 12/21/2020, 1:16 PM

## 2020-12-21 NOTE — Progress Notes (Signed)
Physical Therapy Treatment Patient Details Name: Evan Kirk MRN: [id number] DOB: [birth year] Today's Date: 12/21/2020    History of Present Illness Pt presented to ED with c/o LE weakness, tingling, and burning as well as pain across his back.  MRI of brain showed small acute ischemic stroke at top of Lt motor strip.  Suspect bil. LE numbness and tingling is due to neuropathy, which is chronic, but has worsed due to immunotherapy for CA vs GBS.   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 Took a long time for pt to wake up and become alert enough to participate in therapy. Requires Min-Mod A of 2 to stand from EOB with cues for hand placement. Right knee instability present and needs support during weight shifting tasks in standing. Able to take a few steps to get to chair with mod A of 2 to assist with RW management, right knee stability and balance. Decreased sensation/proprioception BLEs distal to knee. Pt bradycardic with HR as low as 48 bpm during session. Continues to be appropriate for CIR. Will follow.    Follow Up Recommendations  CIR     Equipment Recommendations  Wheelchair cushion (measurements PT);Wheelchair (measurements PT)    Recommendations for Other Services       Precautions / Restrictions Precautions Precautions: Fall;Other (comment) Precaution Comments: bradycardia Restrictions Weight Bearing Restrictions: No    Mobility  Bed Mobility Overal bed mobility: Needs Assistance Bed Mobility: Sidelying to Sit   Sidelying to sit: Total assist;+2 for physical assistance;HOB elevated       General bed mobility comments: Assist of 2 to get pt to EOB due to decreased arousal and not waking up; assist with LEs and trunk. Able to reposition  self to scoot forward once upright.  Transfers Overall transfer level: Needs assistance Equipment used: Rolling walker (2 wheeled) Transfers: Sit to/from Stand Sit to Stand: Min assist;Mod assist;+2 physical assistance;From elevated surface Stand pivot transfers: Mod assist;+2 physical assistance       General transfer comment: Assist of 2 to stand from EOB with cues for hand placement; Right knee support needed during transition due to buckling and difficulty extending. Performed x2. BLEs locked out into knee hyperextension for support. Able to takea f ew steps to get from bed to chair with Mod A for right knee stability, RW management and balance. narrow BoS and flexed trunk.  Ambulation/Gait                 Stairs             Wheelchair Mobility    Modified Rankin (Stroke Patients Only) Modified Rankin (Stroke Patients Only) Pre-Morbid Rankin Score: No significant disability Modified Rankin: Moderately severe disability     Balance Overall balance assessment: Needs assistance Sitting-balance support: Feet supported;No upper extremity supported Sitting balance-Leahy Scale: Fair Sitting balance - Comments: supervision during MMT   Standing balance support: During functional activity Standing balance-Leahy Scale: Poor Standing balance comment: reliant on BUE support of RW; worked on weight shifting, toe tapping to offload LEs with therapist supporting right knee due to buckling with WB.                            Cognition Arousal/Alertness: Youth worker  During Therapy: WFL for tasks assessed/performed Overall Cognitive Status: Impaired/Different from baseline Area of Impairment: Problem solving;Following commands;Safety/judgement;Attention                   Current Attention Level: Sustained (focused at first)   Following Commands: Follows one step commands with increased time Safety/Judgement: Decreased awareness of  safety;Decreased awareness of deficits   Problem Solving: Slow processing;Requires verbal cues;Difficulty sequencing General Comments: Initially very lethargic and sleepy; took a good while to get pt to wake up from sleeping. Blankly staring at therapists and not very verbal at first. Repetition needed to follow commands.      Exercises      General Comments General comments (skin integrity, edema, etc.): Bradycardic with HR as low as 48, some dizziness reported during this time. BP stable and responds appropriately to exercise/activity.      Pertinent Vitals/Pain Pain Assessment: Faces Faces Pain Scale: Hurts little more Pain Location: low back esp with movement Pain Descriptors / Indicators: Grimacing;Guarding Pain Intervention(s): Monitored during session;Repositioned;Limited activity within patient's tolerance    Home Living                      Prior Function            PT Goals (current goals can now be found in the care plan section) Progress towards PT goals: Progressing toward goals (slowly)    Frequency    Min 4X/week      PT Plan Current plan remains appropriate    Co-evaluation PT/OT/SLP Co-Evaluation/Treatment: Yes Reason for Co-Treatment: Complexity of the patient's impairments (multi-system involvement);For patient/therapist safety;To address functional/ADL transfers;Necessary to address cognition/behavior during functional activity PT goals addressed during session: Mobility/safety with mobility;Balance        AM-PAC PT "6 Clicks" Mobility   Outcome Measure  Help needed turning from your back to your side while in a flat bed without using bedrails?: A Lot Help needed moving from lying on your back to sitting on the side of a flat bed without using bedrails?: Total Help needed moving to and from a bed to a chair (including a wheelchair)?: A Lot Help needed standing up from a chair using your arms (e.g., wheelchair or bedside chair)?: A  Lot Help needed to walk in hospital room?: A Lot Help needed climbing 3-5 steps with a railing? : Total 6 Click Score: 10    End of Session Equipment Utilized During Treatment: Gait belt Activity Tolerance: Patient tolerated treatment well Patient left: in chair;with call bell/phone within reach;with chair alarm set Nurse Communication: Mobility status PT Visit Diagnosis: Unsteadiness on feet (R26.81);Muscle weakness (generalized) (M62.81)     Time: [id number] PT Time Calculation (min) (ACUTE ONLY): 41 min  Charges:  $Therapeutic Activity: 23-37 mins                     Marisa Severin, PT, DPT Acute Rehabilitation Services Pager [phone] Office [phone]       Marguarite Arbour A Sabra Heck 12/21/2020, 12:43 PM

## 2020-12-21 NOTE — Progress Notes (Signed)
Inpatient Rehab Admissions Coordinator:   Pt continues to be unreachable by phone.  Will open insurance per discussion with TOC team and pt's preference for CIR.    Estill Dooms, PT, DPT Admissions Coordinator [phone] 12/21/20  10:20 AM

## 2020-12-22 DIAGNOSIS — I482 Chronic atrial fibrillation, unspecified: Secondary | ICD-10-CM | POA: Diagnosis not present

## 2020-12-22 NOTE — Progress Notes (Signed)
Inpatient Rehab Admissions Coordinator:   I have no beds available for this patient to admit to CIR today.  Insurance auth pending.  Will continue to follow for timing of potential admission pending bed availability.   Estill Dooms, PT, DPT Admissions Coordinator [phone] 12/22/20  11:26 AM

## 2020-12-22 NOTE — Progress Notes (Signed)
PROGRESS NOTE  Evan Kirk [id number] DOB: September 16, 1941 DOA: 12/15/2020 PCP: Lawerance Cruel, MD  Brief History   80 year old [place] with a history of atrial fibrillation on Eliquis, HTN, OSA, and renal cell carcinoma with a local recurrence complicated by tumor surrounding the IVC.  He is recently started immunotherapy at [facility] using pembrolizumab/axitinib.  He presented to the ED 12/29 with leg weakness tingling and burning for 48 hours with the right leg greater than left leg weakness as well.  In the ED CT head was unremarkable. CT thoracic lumbar spine showed mass as described below. MRI brain shows a punctate acute infarct in the superior left frontal hemisphere, close to the motor strip. No bleeding. No mass-effect. MRI of the thoracic and lumbar spine with no evidence of spinal cord infarct.  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 Neurology has recommended EMG and neural coduction study as outpatient. The patient has been evalated by PT/OT. Their recommendation is for CIR. The patient is awaiting insurance authorization.  Consultants  . PCCM . Neurology . Rehab medicine  Procedures  . None  Antibiotics   Anti-infectives (From admission, onward)   None     Subjective  The patient is lying in bed. No new complaints.  Objective   Vitals:  Vitals:   12/22/20 1152 12/22/20 1551  BP: (!) 102/36 117/70  Pulse: 74 67  Resp: 19 (!) 21  Temp: 97.7 F (36.5 C) (!) 97.3 F (36.3 C)  SpO2: 98% 98%   Exam:  Constitutional:  The patient is somnolent. He awakens briefly and then goes back to sleep. No acute distress. Respiratory:  . No increased work of breathing. . No wheezes, rales, or rhonchi . No tactile fremitus Cardiovascular:  . Regular rate and rhythm . No murmurs, ectopy, or gallups. . No lateral PMI. No thrills. Abdomen:   . Abdomen is soft, non-tender, non-distended . No hernias, masses, or organomegaly . Normoactive bowel sounds.  Musculoskeletal:  . No cyanosis, clubbing, or edema Skin:  . No rashes, lesions, ulcers . palpation of skin: no induration or nodules Neurologic:  . CN 2-12 intact . Sensation all 4 extremities intact . Weakness of lower extremities bilaterally. Left greater than right. Psychiatric:  . Mental status o Mood, affect appropriate o Orientation to person, place, time  . judgment and insight appear intact I have personally reviewed the following:   Today's Data  . Vitals, BMP  Imaging  . CTA head and neck . CXR . CTA chest - pending.  Cardiology Data  . Echocardiogram . EKG  Scheduled Meds: . apixaban  5 mg Oral BID  . atorvastatin  40 mg Oral Daily  . cholecalciferol  2,000 Units Oral Daily  . hydrocortisone   Topical BID  . ketoconazole  1 application Topical Once per day on Mon Thu  . multivitamin with minerals  1 tablet Oral Daily  . pregabalin  25 mg Oral BID  . senna  1 tablet Oral QHS  . vitamin B-12  2,000 mcg Oral Daily   Continuous Infusions:   Principal Problem:   Acute ischemic stroke [facility]) Active Problems:   Atrial fibrillation (HCC)   Clear cell renal cell carcinoma (HCC)   Neuropathy   Hypercalcemia   LOS: 6 days   A & P  Small subacute ischemic stroke left motor strip: Explains right lower extremity weakness. The patient is chronically  on Eliquis for atrial fibrillation. Neuro following. Stroke work-up to be completed with TTE and CTA of head and neck. CTAhead and neck demonstrates no large vessel occlusion or high grade stenosis of the head or neck. Echocardiogram with Bubble study was negative for source of embolism and bubble study did not demonstrate shunt.  Chest pain with dyspnea: Work up negative for any etiology. No resolved. The patient was saturating 94% on room air. CXR demonstrated no acute pathology. EKG demonstrated atrial  fibrillation with controlled rates unchanged from most recent EKG from May 2021. Troponin was 7. CTA chest demonstrated no pulmonary embolus. H&H was unremarkable.  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 Pt will follow up with oncology at the [facility] as is his wish.    Bilateral lower extremity progressive paresthesias and weakness: Due to multifocal mononeuropathy per neurology. They have recommended EMG and nerve conduction studies as outpatient.   Recurrent renal cell carcinoma: Followed by Oncology at [facility], but actually receiving chemo via Oncologist at [place], [place] (Dr. Thedore Mins). The patient has been in communication with his oncologist who does not feel that the retroperitoneal  mass could be related to RCC. Phone number for Oncologist on call at the [place] ( Dr. Ervin Knack [phone].  Chronic atrial fibrillation on Eliquis: Held. The patient has been transitioned to heparin drip. Rate controlled with a couple of episodes of bradycardia.  Obstructive Sleep apnea: Dependent upon CPAP nightly  I have seen and examined this patient myself. I have spent 30 minutes in his evaluation and care.  Code Status: FULL CODE Family Communication: Spoke with son and patient at bedside Status is: Inpatient  Remains inpatient appropriate because:Inpatient level of care appropriate due to severity of illness and lack of a safe discharge.  Dispo: The patient is from: Home  Anticipated d/c is to: CIR/SNF  Anticipated d/c date is: 2 days  Patient currently is not medically stable to d/c.  Nazanin Kinner, DO [facility] Direct contact: see [email or web address]  7PM-7AM contact night coverage as above 12/22/2020, 5:34 PM  LOS: 1 day   ADDENDUM: Pt requested to speak to me this afternoon. He told me that he had not seen a doctor for 2 days, although I had seen him both days. All questions answered to the best  of my ability. I have recommended to the patient that he speak to neurology regarding the cause of his weakness and their recommendations.

## 2020-12-22 NOTE — Progress Notes (Signed)
CM met with the patient at the bedside to discuss whether he wanted to go to IR under his medicare or [place] insurance. Pt prefers to stay at [facility] for IR but will use his [facility] insurance and go to another facility if necessary. CM has updated Caitlin with CIR and left a voice message for Pam at [facility] as that would be his preference if [employer] used.  TOC following.

## 2020-12-22 NOTE — Progress Notes (Signed)
Pt able to place himself on CPAP when he gets ready. RT will continue to monitor.

## 2020-12-22 NOTE — Progress Notes (Signed)
Per telemetry, HR in 30's unsustained. Pt resting comfortably. Dr. Antionette Char updated.

## 2020-12-22 NOTE — Progress Notes (Signed)
Physical Therapy Treatment Patient Details Name: Evan Kirk MRN: [id number] DOB: Jul 09, 1941 Today's Date: 12/22/2020    History of Present Illness Pt presented to ED with c/o LE weakness, tingling, and burning as well as pain across his back.  MRI of brain showed small acute ischemic stroke at top of Lt motor strip.  Suspect bil. LE numbness and tingling is due to neuropathy, which is chronic, but has worsed due to immunotherapy for CA vs GBS.   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 Improved ambulation distance today with Min A for balance/safety and use of RW. Pt with poor ability to self monitor symptoms. Needed assist to lower into chair due to bil knees buckling when fatigued. Less assist required to stand from chair but cues required for proper hand placement. Pt with poor awareness of safety/deficits. Motivated to return to independence. Will follow and progress as tolerated.     Follow Up Recommendations  CIR     Equipment Recommendations  Wheelchair cushion (measurements PT);Wheelchair (measurements PT)    Recommendations for Other Services       Precautions / Restrictions Precautions Precautions: Fall;Other (comment) Precaution Comments: bradycardia Restrictions Weight Bearing Restrictions: No    Mobility  Bed Mobility               General bed mobility comments: Up in chair upon PT arrival.  Transfers Overall transfer level: Needs assistance Equipment used: Rolling walker (2 wheeled) Transfers: Sit to/from Stand Sit to Stand: Min assist         General transfer comment: Assist to power to standing with cues for hand placement. Stood from Landscape architect.  Ambulation/Gait Ambulation/Gait assistance: Min assist Gait Distance  (Feet): 22 Feet (+ 16') Assistive device: Rolling walker (2 wheeled) Gait Pattern/deviations: Step-through pattern;Decreased step length - right;Decreased step length - left;Narrow base of support Gait velocity: reduced   General Gait Details: Slow, unsteady and shortened step through gait with support for right knee; bil knees buckling needing assist to sit, 1 seated rest break. A mild episode of unresponsiveness on second bout requiring max cues to assist pt into chair. poor ability to self monitor symptoms.   Stairs             Wheelchair Mobility    Modified Rankin (Stroke Patients Only) Modified Rankin (Stroke Patients Only) Pre-Morbid Rankin Score: No significant disability Modified Rankin: Moderately severe disability     Balance Overall balance assessment: Needs assistance Sitting-balance support: Feet supported;No upper extremity supported Sitting balance-Leahy Scale: Fair     Standing balance support: During functional activity Standing balance-Leahy Scale: Poor Standing balance comment: reliant on BUE support of RW and Min-Mod A once fatigued due to bil knees buckling                            Cognition Arousal/Alertness: Awake/alert Behavior During Therapy: WFL for tasks assessed/performed Overall Cognitive Status: Impaired/Different from baseline Area of Impairment: Safety/judgement                         Safety/Judgement: Decreased awareness of safety;Decreased awareness of deficits     General Comments: Poor safety awareness as pt reports getting in chair by himself, "i  tried not to get tangled in these lines."      Exercises      General Comments General comments (skin integrity, edema, etc.): Bp pre activity 100/82, post activity BP 105/80.      Pertinent Vitals/Pain Pain Assessment: No/denies pain    Home Living                      Prior Function            PT Goals (current goals can now be found in  the care plan section) Progress towards PT goals: Progressing toward goals    Frequency    Min 4X/week      PT Plan Current plan remains appropriate    Co-evaluation              AM-PAC PT "6 Clicks" Mobility   Outcome Measure  Help needed turning from your back to your side while in a flat bed without using bedrails?: A Lot Help needed moving from lying on your back to sitting on the side of a flat bed without using bedrails?: A Lot Help needed moving to and from a bed to a chair (including a wheelchair)?: A Little Help needed standing up from a chair using your arms (e.g., wheelchair or bedside chair)?: A Little Help needed to walk in hospital room?: A Little Help needed climbing 3-5 steps with a railing? : A Lot 6 Click Score: 15    End of Session Equipment Utilized During Treatment: Gait belt Activity Tolerance: Patient tolerated treatment well Patient left: in chair;with call bell/phone within reach;with chair alarm set Nurse Communication: Mobility status PT Visit Diagnosis: Unsteadiness on feet (R26.81);Muscle weakness (generalized) (M62.81)     Time: [id number] PT Time Calculation (min) (ACUTE ONLY): 23 min  Charges:  $Gait Training: 23-37 mins                     Marisa Severin, PT, DPT Acute Rehabilitation Services Pager [phone] Office [place] 12/22/2020, 3:02 PM

## 2020-12-23 ENCOUNTER — Encounter (HOSPITAL_COMMUNITY): Payer: Self-pay | Admitting: Physical Medicine and Rehabilitation

## 2020-12-23 ENCOUNTER — Other Ambulatory Visit: Payer: Self-pay

## 2020-12-23 ENCOUNTER — Inpatient Hospital Stay (HOSPITAL_COMMUNITY)
Admission: RE | Admit: 2020-12-23 | Discharge: 2021-01-01 | DRG: 057 | Disposition: A | Discharge status: Home Health | Payer: Medicare Other | Source: Intra-hospital | Attending: Physical Medicine and Rehabilitation | Admitting: Physical Medicine and Rehabilitation

## 2020-12-23 DIAGNOSIS — Z79899 Other long term (current) drug therapy: Secondary | ICD-10-CM

## 2020-12-23 DIAGNOSIS — E785 Hyperlipidemia, unspecified: Secondary | ICD-10-CM | POA: Diagnosis present

## 2020-12-23 DIAGNOSIS — R059 Cough, unspecified: Secondary | ICD-10-CM | POA: Diagnosis not present

## 2020-12-23 DIAGNOSIS — G6182 Multifocal motor neuropathy: Secondary | ICD-10-CM

## 2020-12-23 DIAGNOSIS — C649 Malignant neoplasm of unspecified kidney, except renal pelvis: Secondary | ICD-10-CM

## 2020-12-23 DIAGNOSIS — Z85528 Personal history of other malignant neoplasm of kidney: Secondary | ICD-10-CM | POA: Diagnosis not present

## 2020-12-23 DIAGNOSIS — Z87891 Personal history of nicotine dependence: Secondary | ICD-10-CM

## 2020-12-23 DIAGNOSIS — I110 Hypertensive heart disease with heart failure: Secondary | ICD-10-CM | POA: Diagnosis present

## 2020-12-23 DIAGNOSIS — F4310 Post-traumatic stress disorder, unspecified: Secondary | ICD-10-CM | POA: Diagnosis present

## 2020-12-23 DIAGNOSIS — I482 Chronic atrial fibrillation, unspecified: Secondary | ICD-10-CM | POA: Diagnosis not present

## 2020-12-23 DIAGNOSIS — Z9221 Personal history of antineoplastic chemotherapy: Secondary | ICD-10-CM | POA: Diagnosis not present

## 2020-12-23 DIAGNOSIS — G250 Essential tremor: Secondary | ICD-10-CM | POA: Diagnosis present

## 2020-12-23 DIAGNOSIS — E871 Hypo-osmolality and hyponatremia: Secondary | ICD-10-CM | POA: Diagnosis present

## 2020-12-23 DIAGNOSIS — C641 Malignant neoplasm of right kidney, except renal pelvis: Secondary | ICD-10-CM

## 2020-12-23 DIAGNOSIS — I639 Cerebral infarction, unspecified: Secondary | ICD-10-CM | POA: Diagnosis not present

## 2020-12-23 DIAGNOSIS — G4733 Obstructive sleep apnea (adult) (pediatric): Secondary | ICD-10-CM | POA: Diagnosis present

## 2020-12-23 DIAGNOSIS — N400 Enlarged prostate without lower urinary tract symptoms: Secondary | ICD-10-CM | POA: Diagnosis present

## 2020-12-23 DIAGNOSIS — G589 Mononeuropathy, unspecified: Secondary | ICD-10-CM | POA: Diagnosis not present

## 2020-12-23 DIAGNOSIS — R058 Other specified cough: Secondary | ICD-10-CM

## 2020-12-23 DIAGNOSIS — K5901 Slow transit constipation: Secondary | ICD-10-CM | POA: Diagnosis not present

## 2020-12-23 DIAGNOSIS — Z905 Acquired absence of kidney: Secondary | ICD-10-CM | POA: Diagnosis not present

## 2020-12-23 DIAGNOSIS — Z7901 Long term (current) use of anticoagulants: Secondary | ICD-10-CM

## 2020-12-23 DIAGNOSIS — I5032 Chronic diastolic (congestive) heart failure: Secondary | ICD-10-CM | POA: Diagnosis present

## 2020-12-23 DIAGNOSIS — I69392 Facial weakness following cerebral infarction: Secondary | ICD-10-CM | POA: Diagnosis not present

## 2020-12-23 DIAGNOSIS — L89151 Pressure ulcer of sacral region, stage 1: Secondary | ICD-10-CM | POA: Diagnosis present

## 2020-12-23 DIAGNOSIS — I517 Cardiomegaly: Secondary | ICD-10-CM | POA: Diagnosis not present

## 2020-12-23 DIAGNOSIS — L8990 Pressure ulcer of unspecified site, unspecified stage: Secondary | ICD-10-CM | POA: Insufficient documentation

## 2020-12-23 DIAGNOSIS — I63542 Cerebral infarction due to unspecified occlusion or stenosis of left cerebellar artery: Secondary | ICD-10-CM | POA: Diagnosis present

## 2020-12-23 DIAGNOSIS — C494 Malignant neoplasm of connective and soft tissue of abdomen: Secondary | ICD-10-CM | POA: Diagnosis present

## 2020-12-23 DIAGNOSIS — K5900 Constipation, unspecified: Secondary | ICD-10-CM | POA: Diagnosis present

## 2020-12-23 MED ORDER — KETOCONAZOLE 2 % EX SHAM: MEDICATED_SHAMPOO | CUTANEOUS | Status: DC

## 2020-12-23 MED ORDER — LACTULOSE 10 GM/15ML PO SOLN: 20.0000 g | Freq: Once | ORAL | 0 refills | Status: DC

## 2020-12-23 MED ORDER — VITAMIN B-12 1000 MCG PO TABS
2000.0000 ug | ORAL_TABLET | Freq: Every day | ORAL | Status: DC
  Administered 2020-12-24 – 2021-01-01 (×9): 2000 ug via ORAL
  Filled 2020-12-23 (×10): qty 2

## 2020-12-23 MED ORDER — POLYETHYLENE GLYCOL 3350 17 G PO PACK: 17.0000 g | PACK | Freq: Every day | ORAL | 0 refills | Status: AC

## 2020-12-23 MED ORDER — METRONIDAZOLE 0.75 % EX GEL: Freq: Every day | CUTANEOUS | Status: DC | PRN

## 2020-12-23 MED ORDER — APIXABAN 5 MG PO TABS
5.0000 mg | ORAL_TABLET | Freq: Two times a day (BID) | ORAL | Status: DC
  Administered 2020-12-23 – 2021-01-01 (×18): 5 mg via ORAL
  Filled 2020-12-23 (×18): qty 1

## 2020-12-23 MED ORDER — LACTULOSE 10 GM/15ML PO SOLN
20.0000 g | Freq: Once | ORAL | Status: AC
  Administered 2020-12-23: 20 g via ORAL
  Filled 2020-12-23: qty 30

## 2020-12-23 MED ORDER — LORATADINE 10 MG PO TABS
10.0000 mg | ORAL_TABLET | Freq: Every day | ORAL | Status: DC | PRN
  Administered 2020-12-27: 10 mg via ORAL
  Filled 2020-12-23: qty 1

## 2020-12-23 MED ORDER — ACYCLOVIR 5 % EX OINT: TOPICAL_OINTMENT | CUTANEOUS | Status: DC

## 2020-12-23 MED ORDER — KETOCONAZOLE 2 % EX SHAM
1.0000 "application " | MEDICATED_SHAMPOO | CUTANEOUS | Status: DC
  Administered 2020-12-24 – 2020-12-31 (×3): 1 via TOPICAL
  Filled 2020-12-23: qty 120

## 2020-12-23 MED ORDER — ATORVASTATIN CALCIUM 40 MG PO TABS: 40.0000 mg | ORAL_TABLET | Freq: Every day | ORAL | 0 refills | Status: DC

## 2020-12-23 MED ORDER — ACETAMINOPHEN 500 MG PO TABS
500.0000 mg | ORAL_TABLET | Freq: Four times a day (QID) | ORAL | Status: DC | PRN
  Administered 2020-12-23 – 2020-12-29 (×9): 500 mg via ORAL
  Filled 2020-12-23 (×11): qty 1

## 2020-12-23 MED ORDER — POLYETHYLENE GLYCOL 3350 17 G PO PACK
17.0000 g | PACK | Freq: Every day | ORAL | Status: DC
  Administered 2020-12-23 – 2020-12-29 (×7): 17 g via ORAL
  Filled 2020-12-23 (×10): qty 1

## 2020-12-23 MED ORDER — HYDROCORTISONE (PERIANAL) 2.5 % EX CREA
1.0000 "application " | TOPICAL_CREAM | Freq: Two times a day (BID) | CUTANEOUS | Status: DC
  Administered 2020-12-23 – 2020-12-31 (×16): 1 via TOPICAL
  Filled 2020-12-23 (×2): qty 28.35

## 2020-12-23 MED ORDER — BENZOYL PEROXIDE 5 % EX GEL: Freq: Every day | CUTANEOUS | Status: DC | PRN

## 2020-12-23 MED ORDER — SENNA 8.6 MG PO TABS
1.0000 | ORAL_TABLET | Freq: Every day | ORAL | Status: DC
  Administered 2020-12-23: 8.6 mg via ORAL
  Filled 2020-12-23: qty 1

## 2020-12-23 MED ORDER — POLYETHYLENE GLYCOL 3350 17 G PO PACK
17.0000 g | PACK | Freq: Every day | ORAL | Status: DC
  Administered 2020-12-23: 17 g via ORAL
  Filled 2020-12-23: qty 1

## 2020-12-23 MED ORDER — POLYVINYL ALCOHOL 1.4 % OP SOLN
1.0000 [drp] | Freq: Three times a day (TID) | OPHTHALMIC | Status: DC | PRN
  Administered 2020-12-24: 1 [drp] via OPHTHALMIC
  Filled 2020-12-23: qty 15

## 2020-12-23 MED ORDER — ACYCLOVIR 5 % EX OINT
TOPICAL_OINTMENT | Freq: Every day | CUTANEOUS | Status: DC
  Filled 2020-12-23 (×5): qty 15

## 2020-12-23 MED ORDER — ATORVASTATIN CALCIUM 40 MG PO TABS
40.0000 mg | ORAL_TABLET | Freq: Every day | ORAL | Status: DC
  Administered 2020-12-24 – 2021-01-01 (×9): 40 mg via ORAL
  Filled 2020-12-23 (×9): qty 1

## 2020-12-23 MED ORDER — VITAMIN D 25 MCG (1000 UNIT) PO TABS
2000.0000 [IU] | ORAL_TABLET | Freq: Every day | ORAL | Status: DC
  Administered 2020-12-24 – 2021-01-01 (×9): 2000 [IU] via ORAL
  Filled 2020-12-23 (×9): qty 2

## 2020-12-23 MED ORDER — ADULT MULTIVITAMIN W/MINERALS CH
1.0000 | ORAL_TABLET | Freq: Every day | ORAL | Status: DC
  Administered 2020-12-24 – 2021-01-01 (×9): 1 via ORAL
  Filled 2020-12-23 (×9): qty 1

## 2020-12-23 MED ORDER — PREGABALIN 25 MG PO CAPS: 25.0000 mg | ORAL_CAPSULE | Freq: Two times a day (BID) | ORAL | 0 refills | Status: DC

## 2020-12-23 MED ORDER — PREGABALIN 25 MG PO CAPS
25.0000 mg | ORAL_CAPSULE | Freq: Two times a day (BID) | ORAL | Status: DC
  Administered 2020-12-23 – 2021-01-01 (×18): 25 mg via ORAL
  Filled 2020-12-23 (×18): qty 1

## 2020-12-23 NOTE — PMR Pre-admission (Addendum)
PMR Admission Coordinator Pre-Admission Assessment  Patient: Evan Kirk is an 80 y.o., male MRN: [id number] DOB: December 14, 1941 Height: 5' 6" (167.6 cm) Weight: 102.1 kg              Insurance Information HMO: yes    PPO:      PCP:      IPA:      80/20:      OTHER:  PRIMARY: U[employer] Medicare      Policy#: [id number]      Subscriber: pt CM Name: Darrick Penna      Phone#: [phone]     Fax#: [phone] Pre-Cert#: [id number] auth for CIR provided by Darrick Penna at [place] with updates due on 1/10      Employer:  Benefits:  Phone #: [phone]     Name:  Eff. Date: 12/19/20     Deduct: n/a      Out of Pocket Max: $4500 ($0 met)       Life Max: n/a  CIR: $325/day for days 1-5      SNF: 20 full days Outpatient:      Co-Pay: $30/visit Home Health: 100%      Co-Pay:  DME: 80%     Co-Ins: 20% Providers:  SECONDARY: [facility]      Policy#:       Phone#:   Development worker, community:       Phone#:   The Engineer, petroleum" for patients in Inpatient Rehabilitation Facilities with attached "Privacy Act [place] Records" was provided and verbally reviewed with: Patient  Emergency Contact Information Contact Information    Name Relation Home Work Mobile   [place] Spouse [phone]       Current Medical History  Patient Admitting Diagnosis: CVA  History of Present Illness: Evan Kirk is a 80 year old right-handed male with history of atrial fibrillation maintained on Eliquis, hypertension, OSA, renal cancer with left nephrectomy 2016 with local recurrence of tumor surrounding IVC/aorta (undergoing immunotherapy receiving chemo via oncology [facility] as well as [facility] [phone] as well as Dr. Candiss Norse), BPH, diastolic congestive heart failure, essential tremor, prediabetes.  Presented 12/15/2020 with bilateral extremity weakness right greater than left of acute onset.  CT/MRI showed small focus of acute ischemia in the superior left hemisphere.   No hemorrhage or mass-effect.  CT angiogram of head and neck no emergent large vessel occlusion or stenosis.  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  No evidence of metastatic disease to the thoracic or lumbar spine.  Mild edema and contrast enhancement at the endplates of O8-41-6 S06-30 without discrete masslike lesion.  Echocardiogram with ejection fraction of 60 to 65% no wall motion abnormalities.  Admission chemistries unremarkable except sodium 134 glucose 100 hemoglobin 12.7.  Neurology follow-up maintained on intravenous heparin transitioned to Eliquis.  Therapy evaluations were completed and pt was recommended for CIR.   Complete NIHSS TOTAL: 0 Glasgow Coma Scale Score: 15  Past Medical History  Past Medical History:  Diagnosis Date  . Anticoagulated on Coumadin    followed by [facility]  . Atrial fibrillation ([place])   . Back pain   . Bladder mass   . BPH (benign prostatic hypertrophy)   . Chronic atrial fibrillation ([place])   . Diastolic CHF, chronic [facility])    cardiologist-- dr Daneen Schick-- asymptomatic  . Diverticulosis of colon   .  Edema   . Essential tremor 06-2015  . History of urinary retention   . HTN (hypertension)   . OSA on CPAP   . Peripheral neuropathy    BILATERAL FEET  . Prediabetes   . PTSD (post-traumatic stress disorder)   . Renal cell carcinoma of left kidney (HCC)     Family History  family history includes Stroke in his mother.  Prior Rehab/Hospitalizations:  Has the patient had prior rehab or hospitalizations prior to admission? No  Has the patient had major surgery during 100 days prior to admission? No  Current Medications   Current Facility-Administered Medications:  .  acetaminophen (TYLENOL) tablet 500 mg, 500 mg, Oral, Q6H PRN, Etta Quill, DO, 500 mg at 12/22/20 1200 .  apixaban  (ELIQUIS) tablet 5 mg, 5 mg, Oral, BID, Kirby-Graham, Karsten Fells, NP, 5 mg at 12/22/20 2235 .  atorvastatin (LIPITOR) tablet 40 mg, 40 mg, Oral, Daily, Oretha Milch D, MD, 40 mg at 12/22/20 1200 .  cholecalciferol (VITAMIN D3) tablet 2,000 Units, 2,000 Units, Oral, Daily, Etta Quill, DO, 2,000 Units at 12/22/20 1201 .  hydrocortisone (ANUSOL-HC) 2.5 % rectal cream, , Topical, BID, Etta Quill, DO, Given at 12/22/20 2235 .  ketoconazole (NIZORAL) 2 % shampoo 1 application, 1 application, Topical, Once per day on Mon Thu, Gardner, Jared M, DO, 1 application at 65/53/74 1103 .  loratadine (CLARITIN) tablet 10 mg, 10 mg, Oral, Daily PRN, Etta Quill, DO .  multivitamin with minerals tablet 1 tablet, 1 tablet, Oral, Daily, Etta Quill, DO, 1 tablet at 12/22/20 1201 .  polyvinyl alcohol (LIQUIFILM TEARS) 1.4 % ophthalmic solution 1 drop, 1 drop, Both Eyes, TID PRN, Etta Quill, DO, 1 drop at 12/22/20 0123 .  pregabalin (LYRICA) capsule 25 mg, 25 mg, Oral, BID, Etta Quill, DO, 25 mg at 12/22/20 2235 .  senna (SENOKOT) tablet 8.6 mg, 1 tablet, Oral, QHS, Gardner, Jared M, DO, 8.6 mg at 12/22/20 2235 .  vitamin B-12 (CYANOCOBALAMIN) tablet 2,000 mcg, 2,000 mcg, Oral, Daily, Swayze, Ava, DO, 2,000 mcg at 12/22/20 1202  Patients Current Diet:  Diet Order            Diet Heart Room service appropriate? Yes; Fluid consistency: Thin  Diet effective now                 Precautions / Restrictions Precautions Precautions: Fall,Other (comment) Precaution Comments: bradycardia Restrictions Weight Bearing Restrictions: No   Has the patient had 2 or more falls or a fall with injury in the past year?Yes  Prior Activity Level Limited Community (1-2x/wk): have been getting out less the last 2 years, but still driving, intermittently using a cane/RW due to neuropathy in LEs, primary caregiver for wife (no physical assist)  Prior Functional Level Prior Function Level of  Independence: Independent with assistive device(s) Comments: Was using RW since having weakness.  Self Care: Did the patient need help bathing, dressing, using the toilet or eating?  Independent  Indoor Mobility: Did the patient need assistance with walking from room to room (with or without device)? Independent  Stairs: Did the patient need assistance with internal or external stairs (with or without device)? Independent  Functional Cognition: Did the patient need help planning regular tasks such as shopping or remembering to take medications? Independent  Home Assistive Devices / Equipment Home Equipment: Grab bars - tub/shower,Grab bars - toilet,Cane - single point,Walker - 2 wheels  Prior Device Use: Indicate devices/aids used by the  patient prior to current illness, exacerbation or injury? Walker  Current Functional Level Cognition  Arousal/Alertness: Awake/alert Overall Cognitive Status: Impaired/Different from baseline Current Attention Level: Sustained Orientation Level: Oriented X4 Following Commands: Follows one step commands with increased time Safety/Judgement: Decreased awareness of safety,Decreased awareness of deficits General Comments: Poor safety awareness as pt reports getting in chair by himself, "i tried not to get tangled in these lines." Memory: Impaired Memory Impairment: Decreased recall of new information (4/4 words recalled independently, but Pt reports "struggle with memory") Awareness: Appears intact Problem Solving: Appears intact Safety/Judgment: Appears intact    Extremity Assessment (includes Sensation/Coordination)  Upper Extremity Assessment: LUE deficits/detail LUE Deficits / Details: requires (A) to place hand on abdomen, min (A) to elevate shoulder and place hand on RW  Lower Extremity Assessment: RLE deficits/detail RLE Deficits / Details: Grossly 3+/5 at the hip and knee, 4/5 at ankle    ADLs  Overall ADL's : Needs  assistance/impaired Eating/Feeding: Independent,Bed level Eating/Feeding Details (indicate cue type and reason): setup for meal and MD arriving Grooming: Minimal assistance,Sitting Grooming Details (indicate cue type and reason): pt sleeping and R eye appears closed with secretions. OT total (A) to help with arousal and opening of eye Upper Body Bathing: Minimal assistance,Sitting Lower Body Bathing: Moderate assistance,Sit to/from stand Upper Body Dressing : Minimal assistance,Sitting Lower Body Dressing: Total assistance Toilet Transfer: Moderate assistance,+2 for physical assistance,+2 for safety/equipment,Stand-pivot,BSC Toilet Transfer Details (indicate cue type and reason): pt requires one therapist assisting guiding RW and hips on L side and one therapist on R side to maintain knee extension Toileting- Clothing Manipulation and Hygiene: Moderate assistance,Sit to/from stand Functional mobility during ADLs: Moderate assistance,+2 for physical assistance,+2 for safety/equipment,Rolling walker General ADL Comments: pt with decreased alertness at start of session requiring max multimodal cues and mobility initation to wake up. knee buckling limits safe ADL mobility progression    Mobility  Overal bed mobility: Needs Assistance Bed Mobility: Sidelying to Sit Sidelying to sit: Total assist,+2 for physical assistance,HOB elevated Supine to sit: Mod assist Sit to supine: Min assist General bed mobility comments: Up in chair upon PT arrival.    Transfers  Overall transfer level: Needs assistance Equipment used: Rolling walker (2 wheeled) Transfers: Sit to/from Stand Sit to Stand: Min assist Stand pivot transfers: Mod assist,+2 physical assistance General transfer comment: Assist to power to standing with cues for hand placement. Stood from Automotive engineer.    Ambulation / Gait / Stairs / Wheelchair Mobility  Ambulation/Gait Ambulation/Gait assistance: Herbalist (Feet): 22 Feet  (+ 16') Assistive device: Rolling walker (2 wheeled) Gait Pattern/deviations: Step-through pattern,Decreased step length - right,Decreased step length - left,Narrow base of support General Gait Details: Slow, unsteady and shortened step through gait with support for right knee; bil knees buckling needing assist to sit, 1 seated rest break. A mild episode of unresponsiveness on second bout requiring max cues to assist pt into chair. poor ability to self monitor symptoms. Gait velocity: reduced Gait velocity interpretation: <1.31 ft/sec, indicative of household ambulator    Posture / Balance Dynamic Sitting Balance Sitting balance - Comments: supervision during MMT Balance Overall balance assessment: Needs assistance Sitting-balance support: Feet supported,No upper extremity supported Sitting balance-Leahy Scale: Fair Sitting balance - Comments: supervision during MMT Standing balance support: During functional activity Standing balance-Leahy Scale: Poor Standing balance comment: reliant on BUE support of RW and Min-Mod A once fatigued due to bil knees buckling    Special needs/care consideration CPAP  Previous Home Environment (from acute therapy documentation) Living Arrangements: Spouse/significant other  Lives With: Spouse Available Help at Discharge: Family Type of Home: House Home Layout: One level Home Access: Ramped entrance Bathroom Shower/Tub: Chiropodist: Handicapped height Additional Comments: Wife unable to physically assist  Discharge Living Setting Plans for Discharge Living Setting: Patient's home,Lives with (comment) (wife) Type of Home at Discharge: House Discharge Home Layout: One level Discharge Home Access: [place] entrance Discharge Bathroom Shower/Tub: Tub/shower unit Discharge Bathroom Toilet: Handicapped height Discharge Bathroom Accessibility: Yes How Accessible: Accessible via walker Does the patient have any problems obtaining  your medications?: No  Social/Family/Support Systems Patient Roles: Spouse,Caregiver Anticipated Caregiver: pt with mod I goals, but son's have been staying with his wife on a rotation (there are 5) while he's been in the hospital Anticipated Caregiver's Contact Information: Katharine Look: [phone] Ability/Limitations of Caregiver: wife cannot provide physical assist Caregiver Availability: 24/7 Discharge Plan Discussed with Primary Caregiver: Yes Is Caregiver In Agreement with Plan?: Yes Does Caregiver/Family have Issues with Lodging/Transportation while Pt is in Rehab?: No   Goals Patient/Family Goal for Rehab: PT/OT/SLP mod I Expected length of stay: 7-10 days Pt/Family Agrees to Admission and willing to participate: Yes Program Orientation Provided & Reviewed with Pt/Caregiver Including Roles  & Responsibilities: Yes  Barriers to Discharge: Insurance for SNF coverage   Decrease burden of Care through IP rehab admission: n/a  Possible need for SNF placement upon discharge: not anticipated  Patient Condition: This patient's medical and functional status has changed since the consult dated: 12/18/20 in which the Rehabilitation Physician determined and documented that the patient's condition is appropriate for intensive rehabilitative care in an inpatient rehabilitation facility. See "History of Present Illness" (above) for medical update. Functional changes are: pt min assist +2 with mobility and pre-gait. Patient's medical and functional status update has been discussed with the Rehabilitation physician and patient remains appropriate for inpatient rehabilitation. Will admit to inpatient rehab today.  Preadmission Screen Completed By:  Michel Santee, PT, 12/23/2020 10:13 AM ______________________________________________________________________   Discussed status with Dr. Posey Pronto on 12/23/20 at 10:31 AM and received approval for admission today.  Admission Coordinator:  Michel Santee,  time 10:31 AM Sudie Grumbling 12/23/20

## 2020-12-23 NOTE — Progress Notes (Signed)
Inpatient Rehabilitation Medication Review by a Pharmacist  A complete drug regimen review was completed for this patient to identify any potential clinically significant medication issues.  Clinically significant medication issues were identified:  Yes   Type of Medication Issue Identified Description of Issue Urgent (address now) Non-Urgent (address on AM team rounds) Plan Plan Accepted by Provider? (Yes / No / Pending AM Rounds)  Drug Interaction(s) (clinically significant)       Duplicate Therapy       Allergy       No Medication Administration End Date       Incorrect Dose       Additional Drug Therapy Needed       Other  The following drugs were listed on pt's discharge med list from [facility]: Benzoyl peroxide 5% gel PRN Peripheral neuropathy cream PRN Metronidazole 0.75% cream PRN (pt rec'd none of the above while at [facility]) Miralax daily (pr rec'd at [facility]) Non urgent Review by CIR team on AM rounds Pending AM rounds    Name of provider notified for urgent issues identified:  N/A  For non-urgent medication issues to be resolved on team rounds tomorrow morning a [facility] Secure Chat Handoff was sent to:  Deatra Ina, PA  Time spent performing this drug regimen review (minutes):  15  Vicki Mallet, PharmD, BCPS, [facility] Clinical Pharmacist 12/23/2020 3:18 PM

## 2020-12-23 NOTE — Progress Notes (Signed)
Izora Ribas, MD  Physician  Physical Medicine and Rehabilitation  Consult Note    Signed  Date of Service:  12/18/2020 6:38 AM      Related encounter: ED to Hosp-Admission (Discharged) from 12/15/2020 in [place] [facility]       Signed      Expand All Collapse All    Show:Clear all [x] Manual[x] Template[] Copied  Added by: [x] Angiulli, Lavon Paganini, PA-C[x] Raulkar, Clide Deutscher, MD   [] Hover for details       Physical Medicine and Rehabilitation Consult Reason for Consult: Lower extremity weakness bilaterally Referring Physician: [facility]   HPI: Evan Kirk is a 80 y.o. right-handed male with history of atrial fibrillation maintained on Eliquis, hypertension, OSA, renal cancer with left nephrectomy 2016 with local recurrence and tumor surrounding IVC/aorta undergoing immunotherapy receiving chemo via oncologist at [facility], [place], diastolic congestive heart failure, essential tremor, prediabetes.  Per chart review patient lives with spouse.  1 level home ramped entrance.  Independent with assistive device.  Wife unable to physically assist.  Presented 12/15/2020 with bilateral lower extremity weakness right greater than left of acute onset.  CT/MRI showed small focus of acute ischemia in the superior left hemisphere.  No hemorrhage or mass-effect.  CT angiogram of head and neck no emergent large vessel occlusion or stenosis.  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  No evidence of metastatic disease to the thoracic or lumbar spine.  Mild edema and contrast-enhancement at the endplates of D34-534 [id number] [id number] without discrete masslike lesion.  Echocardiogram with ejection fraction of 60 to 65% no wall motion abnormalities.  Admission chemistries were unremarkable except sodium 134 glucose 100, hemoglobin  12.7.  Neurology follow-up currently maintained on intravenous heparin and await transition to Eliquis.  Awaiting plan for possible need for LP.  Physical Medicine & Rehabilitation was consulted to assess candidacy for CIR given lower extremity weakness. Currently with leg swelling and constipation.    Review of Systems  Constitutional: Negative for chills and fever.  HENT: Negative for hearing loss.   Eyes: Negative for blurred vision and double vision.  Respiratory: Negative for cough and shortness of breath.   Cardiovascular: Positive for leg swelling. Negative for chest pain and palpitations.  Gastrointestinal: Positive for constipation. Negative for heartburn, nausea and vomiting.  Genitourinary: Positive for urgency. Negative for dysuria, flank pain and hematuria.  Musculoskeletal: Positive for back pain.  Skin: Negative for rash.  Neurological: Positive for tremors and weakness.  Psychiatric/Behavioral:       PTSD  All other systems reviewed and are negative.      Past Medical History:  Diagnosis Date  . Anticoagulated on Coumadin    followed by [place] [facility]  . Atrial fibrillation ([place])   . Back pain   . Bladder mass   . BPH (benign prostatic hypertrophy)   . Chronic atrial fibrillation ([place])   . Diastolic CHF, chronic [facility])    cardiologist-- dr Daneen Schick-- asymptomatic  . Diverticulosis of colon   . Edema   . Essential tremor 06-2015  . History of urinary retention   . HTN (hypertension)   . OSA on CPAP   . Peripheral neuropathy    BILATERAL FEET  . Prediabetes   . PTSD (post-traumatic stress disorder)   . Renal cell carcinoma of left kidney (HCC)  Past Surgical History:  Procedure Laterality Date  . CYSTOSCOPY WITH BIOPSY N/A 10/09/2015   Procedure: CYSTOSCOPY WITH BIOPSY;  Surgeon: Malen Gauze, MD;  Location: [facility];  Service: Urology;  Laterality: N/A;  . NEPHRECTOMY Left   . NEPHRECTOMY  RADICAL Left 08/13/2015   [facility]-  . TOTAL KNEE ARTHROPLASTY Right 11-10-2008  . TRANSTHORACIC ECHOCARDIOGRAM  08-12-2015      moderate concentric LVH, ef 50-55-%/  trivial MR , PR and TR/  severe LAE and RAE  . VASECTOMY REVERSAL  1978        Family History  Problem Relation Age of Onset  . Stroke Mother    Social History:  reports that he quit smoking about 54 years ago. He has quit using smokeless tobacco. He reports that he does not drink alcohol and does not use drugs. Allergies:       Allergies  Allergen Reactions  . Pembrolizumab Other (See Comments)    1) severe neuropathy 2) may or may not be causing GBS as well, this still TBD, see 12/16/2020 admission for further details  . Diltiazem Other (See Comments)    Pt didn't feel well          Medications Prior to Admission  Medication Sig Dispense Refill  . acetaminophen (TYLENOL) 500 MG tablet Take 500 mg by mouth every 6 (six) hours as needed for mild pain.    Marland Kitchen apixaban (ELIQUIS) 5 MG TABS tablet Take 5 mg by mouth 2 (two) times daily.    . benzoyl peroxide 5 % gel Apply 1 application topically daily as needed (skin irritation).    . carboxymethylcellulose (REFRESH PLUS) 0.5 % SOLN Place 1 drop into both eyes 3 (three) times daily as needed (dry eyes).    . cholecalciferol (VITAMIN D) 1000 UNITS tablet Take 2,000 Units by mouth daily.    . hydrocortisone 2.5 % lotion Apply topically 2 (two) times daily.    Marland Kitchen ketoconazole (NIZORAL) 2 % shampoo Apply 1 application topically 2 (two) times a week.    . loratadine (CLARITIN) 10 MG tablet Take 10 mg by mouth daily as needed for allergies.     . metoprolol (LOPRESSOR) 50 MG tablet Take 0.5 tablets (25 mg total) by mouth 2 (two) times daily. 30 tablet 6  . metroNIDAZOLE (FLAGYL) 500 MG tablet Take 500 mg by mouth 3 (three) times daily.    . metroNIDAZOLE (METROCREAM) 0.75 % cream Apply 1 application topically daily as needed. (roscea)    . Multiple  Vitamin (MULTIVITAMIN) capsule Take 1 capsule by mouth daily.    . NON FORMULARY Peripheral Neuropathy Cream: Bupivacaine 1% Doxepin 3% Gabapentin 6% Pentoxifyline 3% Topiramate 1% Sig: Apply 1 to 2 gm to affected areas 3-4x daily prn  Refill 1    . OVER THE COUNTER MEDICATION Take 2 capsules by mouth at bedtime. Med Name: INTESTINAL FORMULA #1    . senna (SENOKOT) 8.6 MG tablet Take 1 tablet by mouth at bedtime.    . traZODone (DESYREL) 100 MG tablet Take 100 mg by mouth at bedtime as needed for sleep.       Home: Home Living Family/patient expects to be discharged to:: Private residence Living Arrangements: Spouse/significant other Available Help at Discharge: Family,Available 24 hours/day Type of Home: House Home Access: Ramped entrance Home Layout: One level Bathroom Shower/Tub: Engineer, manufacturing systems: Handicapped height Home Equipment: Grab bars - tub/shower,Grab bars - toilet,Cane - single point,Walker - 2 wheels Additional Comments: Wife unable  to physically assist  Functional History: Prior Function Level of Independence: Independent with assistive device(s) Comments: Was using RW since having weakness. Functional Status:  Mobility: Bed Mobility Overal bed mobility: Needs Assistance Bed Mobility: Supine to Sit,Sit to Supine Supine to sit: Mod assist Sit to supine: Min assist General bed mobility comments: assist to lift trunk, and assist to lift Rt LE back into the bed Transfers Overall transfer level: Needs assistance Equipment used: 2 person hand held assist Transfers: Sit to/from [employer] Sit to Stand: Mod assist,+2 safety/equipment Stand pivot transfers: Mod assist,+2 physical assistance,+2 safety/equipment General transfer comment: Unsteady when taking steps requiring mod A for steadying.  ADL: ADL Overall ADL's : Needs assistance/impaired Eating/Feeding: Independent,Bed level Grooming: Wash/dry hands,Wash/dry  face,Oral care,Brushing hair,Moderate assistance,Standing Upper Body Bathing: Minimal assistance,Sitting Lower Body Bathing: Moderate assistance,Sit to/from stand Upper Body Dressing : Minimal assistance,Sitting Lower Body Dressing: Maximal assistance,Sit to/from stand Toilet Transfer: Moderate assistance,Ambulation,Comfort height toilet,+2 for safety/equipment,+2 for physical assistance Toileting- Clothing Manipulation and Hygiene: Moderate assistance,Sit to/from stand Functional mobility during ADLs: Moderate assistance,+2 for physical assistance,+2 for safety/equipment  Cognition: Cognition Overall Cognitive Status: Within Functional Limits for tasks assessed Orientation Level: Oriented X4 Cognition Arousal/Alertness: Awake/alert Behavior During Therapy: WFL for tasks assessed/performed Overall Cognitive Status: Within Functional Limits for tasks assessed General Comments: WFL for basic info, but would benefit from further assessment  Blood pressure (!) 139/57, pulse 68, temperature 98.2 F (36.8 C), temperature source Oral, resp. rate 13, height 5\' 6"  (1.676 m), weight 102.1 kg, SpO2 96 %. Physical Exam General: Alert, No apparent distress HEENT: Head is normocephalic, atraumatic, PERRLA, EOMI, sclera anicteric, oral mucosa pink and moist, dentition intact, ext ear canals clear,  Neck: Supple without JVD or lymphadenopathy Heart: Reg rate and rhythm. No murmurs rubs or gallops Chest: CTA bilaterally without wheezes, rales, or rhonchi; no distress Abdomen: Soft, non-tender, non-distended, bowel sounds positive. Extremities: No clubbing, cyanosis, or edema. Pulses are 2+ Skin: Clean and intact without signs of breakdown Neuro: Patient is alert in no acute distress oriented x3 and follows commands. RLE 4/5, LLE 5/5 proximally and 4/5 distally. Sensation is intact Psych: Pt's affect is appropriate. Pt is cooperative  Lab Results Last 24 Hours       Results for orders placed or  performed during the hospital encounter of 12/15/20 (from the past 24 hour(s))  Vitamin B12     Status: Abnormal   Collection Time: 12/16/20 10:43 AM  Result Value Ref Range   Vitamin B-12 1,048 (H) 180 - 914 pg/mL  Folate     Status: None   Collection Time: 12/16/20 10:43 AM  Result Value Ref Range   Folate 12.5 >5.9 ng/mL  TSH     Status: None   Collection Time: 12/16/20 10:43 AM  Result Value Ref Range   TSH 1.535 0.350 - 4.500 uIU/mL  Comprehensive metabolic panel     Status: Abnormal   Collection Time: 12/17/20  3:14 AM  Result Value Ref Range   Sodium 134 (L) 135 - 145 mmol/L   Potassium 4.1 3.5 - 5.1 mmol/L   Chloride 103 98 - 111 mmol/L   CO2 23 22 - 32 mmol/L   Glucose, Bld 113 (H) 70 - 99 mg/dL   BUN 16 8 - 23 mg/dL   Creatinine, Ser 7.35 0.61 - 1.24 mg/dL   Calcium 67.0 8.9 - 14.1 mg/dL   Total Protein 7.1 6.5 - 8.1 g/dL   Albumin 3.0 (L) 3.5 - 5.0 g/dL  AST 12 (L) 15 - 41 U/L   ALT 10 0 - 44 U/L   Alkaline Phosphatase 54 38 - 126 U/L   Total Bilirubin 0.5 0.3 - 1.2 mg/dL   GFR, Estimated >60 >60 mL/min   Anion gap 8 5 - 15  CBC     Status: Abnormal   Collection Time: 12/17/20  3:14 AM  Result Value Ref Range   WBC 8.1 4.0 - 10.5 K/uL   RBC 4.24 4.22 - 5.81 MIL/uL   Hemoglobin 12.5 (L) 13.0 - 17.0 g/dL   HCT 38.1 (L) 39.0 - 52.0 %   MCV 89.9 80.0 - 100.0 fL   MCH 29.5 26.0 - 34.0 pg   MCHC 32.8 30.0 - 36.0 g/dL   RDW 13.8 11.5 - 15.5 %   Platelets 395 150 - 400 K/uL   nRBC 0.0 0.0 - 0.2 %  Magnesium     Status: None   Collection Time: 12/17/20  3:14 AM  Result Value Ref Range   Magnesium 2.0 1.7 - 2.4 mg/dL  Heparin level (unfractionated)     Status: Abnormal   Collection Time: 12/17/20  3:47 AM  Result Value Ref Range   Heparin Unfractionated 0.79 (H) 0.30 - 0.70 IU/mL  APTT     Status: Abnormal   Collection Time: 12/17/20  3:47 AM  Result Value Ref Range   aPTT 85 (H) 24 - 36 seconds      Imaging  Results (Last 48 hours)  CT ANGIO HEAD W OR WO CONTRAST  Result Date: 12/16/2020 CLINICAL DATA:  Weakness EXAM: CT ANGIOGRAPHY HEAD AND NECK TECHNIQUE: Multidetector CT imaging of the head and neck was performed using the standard protocol during bolus administration of intravenous contrast. Multiplanar CT image reconstructions and MIPs were obtained to evaluate the vascular anatomy. Carotid stenosis measurements (when applicable) are obtained utilizing NASCET criteria, using the distal internal carotid diameter as the denominator. CONTRAST:  62mL OMNIPAQUE IOHEXOL 350 MG/ML SOLN COMPARISON:  Head CT 12/15/2020 FINDINGS: CT HEAD FINDINGS Brain: There is no mass, hemorrhage or extra-axial collection. There is generalized atrophy without lobar predilection. There is no acute or chronic infarction. The brain parenchyma is normal. Skull: The visualized skull base, calvarium and extracranial soft tissues are normal. Sinuses/Orbits: No fluid levels or advanced mucosal thickening of the visualized paranasal sinuses. No mastoid or middle ear effusion. The orbits are normal. CTA NECK FINDINGS SKELETON: There is no bony spinal canal stenosis. No lytic or blastic lesion. OTHER NECK: Normal pharynx, larynx and major salivary glands. No cervical lymphadenopathy. Unremarkable thyroid gland. UPPER CHEST: No pneumothorax or pleural effusion. No nodules or masses. AORTIC ARCH: There is calcific atherosclerosis of the aortic arch. There is no aneurysm, dissection or hemodynamically significant stenosis of the visualized portion of the aorta. Conventional 3 vessel aortic branching pattern. The visualized proximal subclavian arteries are widely patent. RIGHT CAROTID SYSTEM: No dissection, occlusion or aneurysm. Mild atherosclerotic calcification at the carotid bifurcation without hemodynamically significant stenosis. LEFT CAROTID SYSTEM: No dissection, occlusion or aneurysm. Mild atherosclerotic calcification at the carotid  bifurcation without hemodynamically significant stenosis. VERTEBRAL ARTERIES: Left dominant configuration. Both origins are clearly patent. There is no dissection, occlusion or flow-limiting stenosis to the skull base (V1-V3 segments). CTA HEAD FINDINGS POSTERIOR CIRCULATION: --Vertebral arteries: Normal V4 segments. --Inferior cerebellar arteries: Normal. --Basilar artery: Normal. --Superior cerebellar arteries: Normal. --Posterior cerebral arteries (PCA): Normal. ANTERIOR CIRCULATION: --Intracranial internal carotid arteries: Normal. --Anterior cerebral arteries (ACA): Normal. Both A1 segments are present. Patent anterior communicating  artery (a-comm). --Middle cerebral arteries (MCA): Normal. VENOUS SINUSES: As permitted by contrast timing, patent. ANATOMIC VARIANTS: None Review of the MIP images confirms the above findings. IMPRESSION: 1. No emergent large vessel occlusion or high-grade stenosis of the head or neck. Aortic Atherosclerosis (ICD10-I70.0). Electronically Signed   By: Deatra RobinsonKevin  Herman M.D.   On: 12/16/2020 21:21   CT Head Wo Contrast  Result Date: 12/15/2020 CLINICAL DATA:  weakness EXAM: CT HEAD WITHOUT CONTRAST TECHNIQUE: Contiguous axial images were obtained from the base of the skull through the vertex without intravenous contrast. COMPARISON:  None. FINDINGS: Brain: No evidence of acute territorial infarction, hemorrhage, hydrocephalus,extra-axial collection or mass lesion/mass effect. There is dilatation the ventricles and sulci consistent with age-related atrophy. Low-attenuation changes in the deep white matter consistent with small vessel ischemia. Vascular: No hyperdense vessel or unexpected calcification. Skull: The skull is intact. No fracture or focal lesion identified. Sinuses/Orbits: The visualized paranasal sinuses and mastoid air cells are clear. The orbits and globes intact. Other: None IMPRESSION: No acute intracranial abnormality. Findings consistent with age related atrophy  and chronic small vessel ischemia Electronically Signed   By: Jonna ClarkBindu  Avutu M.D.   On: 12/15/2020 21:11   CT ANGIO NECK W OR WO CONTRAST  Result Date: 12/16/2020 CLINICAL DATA:  Weakness EXAM: CT ANGIOGRAPHY HEAD AND NECK TECHNIQUE: Multidetector CT imaging of the head and neck was performed using the standard protocol during bolus administration of intravenous contrast. Multiplanar CT image reconstructions and MIPs were obtained to evaluate the vascular anatomy. Carotid stenosis measurements (when applicable) are obtained utilizing NASCET criteria, using the distal internal carotid diameter as the denominator. CONTRAST:  75mL OMNIPAQUE IOHEXOL 350 MG/ML SOLN COMPARISON:  Head CT 12/15/2020 FINDINGS: CT HEAD FINDINGS Brain: There is no mass, hemorrhage or extra-axial collection. There is generalized atrophy without lobar predilection. There is no acute or chronic infarction. The brain parenchyma is normal. Skull: The visualized skull base, calvarium and extracranial soft tissues are normal. Sinuses/Orbits: No fluid levels or advanced mucosal thickening of the visualized paranasal sinuses. No mastoid or middle ear effusion. The orbits are normal. CTA NECK FINDINGS SKELETON: There is no bony spinal canal stenosis. No lytic or blastic lesion. OTHER NECK: Normal pharynx, larynx and major salivary glands. No cervical lymphadenopathy. Unremarkable thyroid gland. UPPER CHEST: No pneumothorax or pleural effusion. No nodules or masses. AORTIC ARCH: There is calcific atherosclerosis of the aortic arch. There is no aneurysm, dissection or hemodynamically significant stenosis of the visualized portion of the aorta. Conventional 3 vessel aortic branching pattern. The visualized proximal subclavian arteries are widely patent. RIGHT CAROTID SYSTEM: No dissection, occlusion or aneurysm. Mild atherosclerotic calcification at the carotid bifurcation without hemodynamically significant stenosis. LEFT CAROTID SYSTEM: No  dissection, occlusion or aneurysm. Mild atherosclerotic calcification at the carotid bifurcation without hemodynamically significant stenosis. VERTEBRAL ARTERIES: Left dominant configuration. Both origins are clearly patent. There is no dissection, occlusion or flow-limiting stenosis to the skull base (V1-V3 segments). CTA HEAD FINDINGS POSTERIOR CIRCULATION: --Vertebral arteries: Normal V4 segments. --Inferior cerebellar arteries: Normal. --Basilar artery: Normal. --Superior cerebellar arteries: Normal. --Posterior cerebral arteries (PCA): Normal. ANTERIOR CIRCULATION: --Intracranial internal carotid arteries: Normal. --Anterior cerebral arteries (ACA): Normal. Both A1 segments are present. Patent anterior communicating artery (a-comm). --Middle cerebral arteries (MCA): Normal. VENOUS SINUSES: As permitted by contrast timing, patent. ANATOMIC VARIANTS: None Review of the MIP images confirms the above findings. IMPRESSION: 1. No emergent large vessel occlusion or high-grade stenosis of the head or neck. Aortic Atherosclerosis (ICD10-I70.0). Electronically Signed  By: Ulyses Jarred M.D.   On: 12/16/2020 21:21   MR BRAIN WO CONTRAST  Result Date: 12/16/2020 CLINICAL DATA:  Lower extremity weakness.  Renal cell carcinoma. EXAM: MRI HEAD WITHOUT CONTRAST TECHNIQUE: Multiplanar, multiecho pulse sequences of the brain and surrounding structures were obtained without intravenous contrast. COMPARISON:  Brain MRI 07/06/2016 FINDINGS: Brain: There is a small focus of acute ischemia in the superior left hemisphere. Focus of chronic microhemorrhage in the left basal ganglia. No acute hemorrhage. There is multifocal hyperintense T2-weighted signal within the white matter. Generalized volume loss without a clear lobar predilection. The midline structures are normal. Vascular: Major flow voids are preserved. Skull and upper cervical spine: Normal calvarium and skull base. Visualized upper cervical spine and soft tissues  are normal. Unchanged occipital sebaceous cyst. Sinuses/Orbits:No paranasal sinus fluid levels or advanced mucosal thickening. No mastoid or middle ear effusion. Normal orbits. IMPRESSION: 1. Small focus of acute ischemia in the superior left hemisphere. No hemorrhage or mass effect. 2. Findings of chronic microvascular ischemia and generalized volume loss . 3. Assessment for metastatic disease limited without intravenous contrast. Electronically Signed   By: Ulyses Jarred M.D.   On: 12/16/2020 01:01   MR THORACIC SPINE W WO CONTRAST  Result Date: 12/16/2020 CLINICAL DATA:  Renal cell carcinoma. Bilateral lower extremity weakness. EXAM: MRI THORACIC AND LUMBAR SPINE WITHOUT AND WITH CONTRAST TECHNIQUE: Multiplanar and multiecho pulse sequences of the thoracic and lumbar spine were obtained without and with intravenous contrast. CONTRAST:  62mL GADAVIST GADOBUTROL 1 MMOL/ML IV SOLN COMPARISON:  CT thoracic and lumbar spine 12/15/2020 FINDINGS: MRI THORACIC SPINE FINDINGS Alignment:  Physiologic. Vertebrae: There is signal change and mild contrast enhancement at the endplates of D34-534, X33443 and T10-11. No discrete masslike lesion. Cord:  Normal Paraspinal and other soft tissues: Limited assessment of the lungs. Disc levels: No disc herniation or spinal canal stenosis. MRI LUMBAR SPINE FINDINGS Segmentation:  Standard. Alignment: Grade 1 retrolisthesis at L3-4 and grade 1 anterolisthesis at L4-5. Vertebrae: Multilevel degenerative endplate signal changes. No abnormal contrast enhancement. Conus medullaris: Extends to the L1 level and appears normal. 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 Disc levels: L1-L2: Normal disc space and facet joints. No spinal canal stenosis. No neural foraminal stenosis. L2-L3: Normal disc space and facet joints. No spinal canal stenosis. No neural foraminal  stenosis. L3-L4: Disc space narrowing with right asymmetric disc bulge. No spinal canal stenosis. Moderate right and mild left neural foraminal stenosis. L4-L5: Moderate facet hypertrophy with small disc bulge. No spinal canal stenosis. No neural foraminal stenosis. L5-S1: Normal disc space and facet joints. No spinal canal stenosis. No neural foraminal stenosis. Visualized sacrum: Normal. IMPRESSION: 1. 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 2. No evidence of metastatic disease to the thoracic or lumbar spine. 3. Mild edema and contrast enhancement at the endplates at D34-534, X33443 and T10-11 without discrete masslike lesion. This is favored to be degenerative. 4. Moderate right L3-4 neural foraminal stenosis. Electronically Signed   By: Ulyses Jarred M.D.   On: 12/16/2020 01:10   MR Lumbar Spine W Wo Contrast  Result Date: 12/16/2020 CLINICAL DATA:  Renal cell carcinoma. Bilateral lower extremity weakness. EXAM: MRI THORACIC AND LUMBAR SPINE WITHOUT AND WITH CONTRAST TECHNIQUE: Multiplanar and multiecho pulse sequences of the thoracic  and lumbar spine were obtained without and with intravenous contrast. CONTRAST:  75mL GADAVIST GADOBUTROL 1 MMOL/ML IV SOLN COMPARISON:  CT thoracic and lumbar spine 12/15/2020 FINDINGS: MRI THORACIC SPINE FINDINGS Alignment:  Physiologic. Vertebrae: There is signal change and mild contrast enhancement at the endplates of D34-534, X33443 and T10-11. No discrete masslike lesion. Cord:  Normal Paraspinal and other soft tissues: Limited assessment of the lungs. Disc levels: No disc herniation or spinal canal stenosis. MRI LUMBAR SPINE FINDINGS Segmentation:  Standard. Alignment: Grade 1 retrolisthesis at L3-4 and grade 1 anterolisthesis at L4-5. Vertebrae: Multilevel degenerative endplate signal changes. No abnormal contrast enhancement. Conus medullaris: Extends to  the L1 level and appears normal. 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 Disc levels: L1-L2: Normal disc space and facet joints. No spinal canal stenosis. No neural foraminal stenosis. L2-L3: Normal disc space and facet joints. No spinal canal stenosis. No neural foraminal stenosis. L3-L4: Disc space narrowing with right asymmetric disc bulge. No spinal canal stenosis. Moderate right and mild left neural foraminal stenosis. L4-L5: Moderate facet hypertrophy with small disc bulge. No spinal canal stenosis. No neural foraminal stenosis. L5-S1: Normal disc space and facet joints. No spinal canal stenosis. No neural foraminal stenosis. Visualized sacrum: Normal. IMPRESSION: 1. 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 2. No evidence of metastatic disease to the thoracic or lumbar spine. 3. Mild edema and contrast enhancement at the endplates at D34-534, [id number] and T10-11 without discrete masslike lesion. This is favored to be degenerative. 4. Moderate right L3-4 neural foraminal stenosis. Electronically Signed   By: Ulyses Jarred M.D.   On: 12/16/2020 01:10   CT CHEST ABDOMEN PELVIS W CONTRAST  Result Date: 12/15/2020 CLINICAL DATA:  Renal cell carcinoma abdominal mass. started Chemo 12/22 sent here by [facility] center for further evaluation EXAM: CT CHEST, ABDOMEN, AND PELVIS WITH CONTRAST TECHNIQUE: Multidetector CT imaging of the chest, abdomen and pelvis was performed following the standard protocol during bolus administration of intravenous contrast. CONTRAST:  191mL OMNIPAQUE IOHEXOL 300 MG/ML  SOLN COMPARISON:  MRI abdomen 07/20/2015, CT abdomen pelvis 07/06/2015. FINDINGS: CT CHEST FINDINGS Cardiovascular: Left atrial enlargement. The remainder of the heart is normal in  caliber. For. No significant pericardial effusion. The thoracic aorta is normal in caliber. At least moderate atherosclerotic plaque of the thoracic aorta. At least moderate three-vessel coronary artery calcifications. The main pulmonary artery is normal in caliber. No central pulmonary embolus. Mediastinum/Nodes: No enlarged mediastinal, hilar, or axillary lymph nodes. Thyroid gland, trachea, and esophagus demonstrate no significant findings. Lungs/Pleura: Bilateral lower lobe subsegmental atelectasis. Lingular atelectasis. No focal consolidation. There is a round solid 4 mm nodule within the left upper lobe. Micronodule within the right upper lobe (5:38). Micronodule within the right lower lobe (5:80). Subpleural micronodule within the right upper lobe (5:64). No pulmonary mass. No pleural effusion. No pneumothorax. Musculoskeletal: Slightly asymmetric gynecomastia, right greater than left. No suspicious lytic or blastic osseous lesions. No acute displaced fracture. Multilevel degenerative changes of the spine. Please see separately dictated CT thoracolumbar spine 12/15/2020. CT ABDOMEN PELVIS FINDINGS Hepatobiliary: No focal liver abnormality. At least 2.8 cm peripherally calcified gallstone within the gallbladder lumen. No associated gallbladder wall thickening or pericholecystic fluid. No biliary dilatation. Pancreas: No focal lesion. Normal pancreatic contour. No surrounding inflammatory changes. No  main pancreatic ductal dilatation. Spleen: Normal in size without focal abnormality. Adrenals/Urinary Tract: No adrenal nodule bilaterally. Status post left nephrectomy. The right kidney enhances homogeneous Lea. No nephrolithiasis. No hydronephrosis. No hydroureter. The urinary bladder is unremarkable. Status post Stomach/Bowel: Stomach is within normal limits. No evidence of small bowel wall thickening or dilatation. Diffuse sigmoid and descending colon diverticulosis this cysts with slightly increased trace  pericolonic fat stranding but no definite bowel wall thickening. The appendix appears normal. Vascular/Lymphatic: No abdominal aorta or iliac aneurysm. Severe calcified and noncalcified atherosclerotic plaque of the aorta and its branches. Suggestion of prior retroperitoneal lymph node dissection. Heterogeneous lobulated 7.6 x 7.7 x 11.9 cm retroperitoneal mass that is noted to be inseparable from the inferior vena cava, third portion of the duodenum, aorta, and origin/proximal inferior mesenteric artery. No pelvic or inguinal lymphadenopathy. The mass is noted to encase the inferior vena cava. Intraluminal extension of the mass not excluded. No findings suggest extension of the mass into the right renal vein or right cardiac atrium. Reproductive: Prostate is unremarkable. Other: Trace simple free fluid within the abdomen pelvis. No intraperitoneal free gas. No organized fluid collection. Musculoskeletal: No abdominal wall hernia or abnormality. No suspicious lytic or blastic osseous lesions. No acute displaced fracture. Multilevel degenerative changes of the spine. Please see separately dictated CT thoracolumbar spine 12/15/2020. IMPRESSION: 1. A 7.6 x 7.7 x 11.9 cm heterogeneous retroperitoneal mass is inseparable from the inferior vena cava, third portion of the duodenum, aorta, and origin/proximal inferior mesenteric artery. The mass is noted to encase the inferior vena cava with intraluminal extension not excluded. 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 2. Left atrial enlargement. 3. Diffuse descending colon and sigmoid diverticulosis with no CT findings of definite acute diverticulitis. 4. Cholelithiasis with no CT findings of acute cholecystitis or choledocholithiasis. 5. Slightly asymmetric gynecomastia, right greater than left. Recommend mammographic evaluation. 6.  Aortic Atherosclerosis (ICD10-I70.0). 7. Please see separately  dictated CT thoracolumbar spine 12/15/2020. Electronically Signed   By: Iven Finn M.D.   On: 12/15/2020 21:37   CT T-SPINE NO CHARGE  Result Date: 12/15/2020 CLINICAL DATA:  Bilateral lower extremity weakness. EXAM: CT Thoracic and Lumbar spine without contrast TECHNIQUE: Multiplanar CT images of the thoracic and lumbar spine were reconstructed from contemporary CT of the Chest, Abdomen, and Pelvis CONTRAST:  No additional contrast administered COMPARISON:  None FINDINGS: CT THORACIC SPINE FINDINGS Alignment: Mild dextroscoliosis. Vertebrae: No acute fracture or focal pathologic process. Paraspinal and other soft tissues: Please see report for concomitant CT of the chest, abdomen and pelvis. Disc levels: There is no spinal canal stenosis. CT LUMBAR SPINE FINDINGS Segmentation: Standard Alignment: Normal Vertebrae: No acute fracture or focal pathologic process. Paraspinal and other soft tissues: Retroperitoneal mass better characterized on concomitant CT of the chest, abdomen and pelvis. Disc levels: There is moderate L4-5 facet arthrosis. No lumbar spinal canal stenosis or neural impingement. IMPRESSION: 1. No acute abnormality of the thoracic or lumbar spine. 2. Retroperitoneal mass better characterized on concomitant CT of the chest, abdomen and pelvis. Electronically Signed   By: Ulyses Jarred M.D.   On: 12/15/2020 21:20   CT L-SPINE NO CHARGE  Result Date: 12/15/2020 CLINICAL DATA:  Bilateral lower extremity weakness. EXAM: CT Thoracic and Lumbar spine without contrast TECHNIQUE: Multiplanar CT images of the thoracic and lumbar spine were reconstructed from contemporary CT of the Chest, Abdomen, and Pelvis CONTRAST:  No additional contrast administered COMPARISON:  None FINDINGS: CT THORACIC SPINE FINDINGS Alignment: Mild dextroscoliosis. Vertebrae: No acute fracture or focal pathologic process. Paraspinal and other soft tissues: Please see report for concomitant CT of the chest, abdomen and  pelvis. Disc levels: There is no spinal canal stenosis. CT LUMBAR SPINE FINDINGS Segmentation: Standard Alignment: Normal Vertebrae: No acute fracture or focal pathologic process. Paraspinal and other soft tissues: Retroperitoneal mass better characterized on concomitant CT of the chest, abdomen and pelvis. Disc levels: There is moderate L4-5 facet arthrosis. No lumbar spinal canal stenosis or neural impingement. IMPRESSION: 1. No acute abnormality of the thoracic or lumbar spine. 2. Retroperitoneal mass better characterized on concomitant CT of the chest, abdomen and pelvis. Electronically Signed   By: Ulyses Jarred M.D.   On: 12/15/2020 21:20   ECHOCARDIOGRAM COMPLETE BUBBLE STUDY  Result Date: 12/16/2020    ECHOCARDIOGRAM REPORT   Patient Name:   Evan Kirk Date of Exam: 12/16/2020 Medical Rec #:  [id number]         Height:       66.0 in Accession #:    [id number]        Weight:       225.0 lb Date of Birth:  Aug 09, 1941         BSA:          2.102 m Patient Age:    45 years          BP:           116/65 mmHg Patient Gender: M                 HR:           73 bpm. Exam Location:  Inpatient Procedure: 2D Echo and Saline Contrast Bubble Study Indications:    Stroke  History:        Patient has prior history of Echocardiogram examinations, most                 recent 08/12/2015. CHF, Arrythmias:Atrial Fibrillation; Risk                 Factors:Hypertension.  Sonographer:    Mikki Santee RDCS (AE) Referring Phys: Wright  1. Left ventricular ejection fraction, by estimation, is 60 to 65%. The left ventricle has normal function. The left ventricle has no regional wall motion abnormalities. There is mild left ventricular hypertrophy. Left ventricular diastolic parameters are indeterminate.  2. Right ventricular systolic function is mildly reduced. The right ventricular size is mildly enlarged.  3. Left atrial size was severely dilated.  4. The mitral valve is abnormal. Trivial  mitral valve regurgitation. No evidence of mitral stenosis. Moderate mitral annular calcification.  5. The aortic valve is tricuspid. There is moderate calcification of the aortic valve. Aortic valve regurgitation is not visualized. Mild aortic valve stenosis.  6. Aortic dilatation noted. There is dilatation of the ascending aorta, measuring 44 mm.  7. Agitated saline contrast bubble study was negative, with no evidence of any interatrial shunt. FINDINGS  Left Ventricle: Left ventricular ejection fraction, by estimation, is 60 to 65%. The left ventricle has normal function. The left ventricle has no regional wall motion abnormalities. The left ventricular internal cavity size was normal in size. There is  mild left ventricular hypertrophy. Left ventricular diastolic parameters are indeterminate. Right Ventricle: The right ventricular size is mildly enlarged. Right vetricular wall thickness was not well visualized. Right ventricular systolic function is mildly reduced. Left Atrium: Left atrial size was  severely dilated. Right Atrium: Right atrial size was normal in size. Pericardium: There is no evidence of pericardial effusion. Mitral Valve: The mitral valve is abnormal. Moderate mitral annular calcification. Trivial mitral valve regurgitation. No evidence of mitral valve stenosis. Tricuspid Valve: The tricuspid valve is normal in structure. Tricuspid valve regurgitation is trivial. Aortic Valve: The aortic valve is tricuspid. There is moderate calcification of the aortic valve. Aortic valve regurgitation is not visualized. Mild aortic stenosis is present. Aortic valve mean gradient measures 8.2 mmHg. Aortic valve peak gradient measures 18.6 mmHg. Aortic valve area, by VTI measures 1.73 cm. Pulmonic Valve: The pulmonic valve was not well visualized. Pulmonic valve regurgitation is not visualized. Aorta: The aortic root is normal in size and structure and aortic dilatation noted. There is dilatation of the ascending  aorta, measuring 44 mm. Venous: The inferior vena cava was not well visualized. IAS/Shunts: The interatrial septum was not well visualized. Agitated saline contrast was given intravenously to evaluate for intracardiac shunting. Agitated saline contrast bubble study was negative, with no evidence of any interatrial shunt.  LEFT VENTRICLE PLAX 2D LVIDd:         3.80 cm  Diastology LVIDs:         2.70 cm  LV e' medial:   9.36 cm/s LV PW:         1.30 cm  LV E/e' medial: 0.9 LV IVS:        1.30 cm LVOT diam:     2.50 cm LV SV:         77 LV SV Index:   36 LVOT Area:     4.91 cm  RIGHT VENTRICLE RV S prime:     10.20 cm/s TAPSE (M-mode): 1.1 cm LEFT ATRIUM              Index       RIGHT ATRIUM           Index LA diam:        6.20 cm  2.95 cm/m  RA Area:     16.50 cm LA Vol (A2C):   114.0 ml 54.22 ml/m RA Volume:   37.60 ml  17.88 ml/m LA Vol (A4C):   126.0 ml 59.93 ml/m LA Biplane Vol: 120.0 ml 57.08 ml/m  AORTIC VALVE AV Area (Vmax):    1.62 cm AV Area (Vmean):   1.80 cm AV Area (VTI):     1.73 cm AV Vmax:           215.80 cm/s AV Vmean:          132.200 cm/s AV VTI:            0.443 m AV Peak Grad:      18.6 mmHg AV Mean Grad:      8.2 mmHg LVOT Vmax:         71.10 cm/s LVOT Vmean:        48.400 cm/s LVOT VTI:          0.156 m LVOT/AV VTI ratio: 0.35  AORTA Ao Root diam: 3.70 cm MV E velocity: 8.49 cm/s  TRICUSPID VALVE                           TR Peak grad:   21.2 mmHg                           TR Vmax:  230.00 cm/s                            SHUNTS                           Systemic VTI:  0.16 m                           Systemic Diam: 2.50 cm Oswaldo Milian MD Electronically signed by Oswaldo Milian MD Signature Date/Time: 12/16/2020/11:43:55 AM    Final       Assessment/Plan: Diagnosis: Acute ischemic stroke 1. Does the need for close, 24 hr/day medical supervision in concert with the patient's rehab needs make it unreasonable for this patient to be served in a less intensive  setting? Yes 2. Co-Morbidities requiring supervision/potential complications:  1. Lower extremity weakness bilaterally: will require intensive PT and OT 2. Obesity (BMI 36.32): will require counseling 3. Renal cell cancer: will require follow-up from [facility] oncology 4. Atrial fibrillation: monitor HR TID 3. Due to bladder management, bowel management, safety, skin/wound care, disease management, medication administration, pain management and patient education, does the patient require 24 hr/day rehab nursing? Yes 4. Does the patient require coordinated care of a physician, rehab nurse, therapy disciplines of OT, PT to address physical and functional deficits in the context of the above medical diagnosis(es)? Yes Addressing deficits in the following areas: balance, endurance, locomotion, strength, transferring, bowel/bladder control, bathing, dressing, feeding, grooming, toileting and psychosocial support 5. Can the patient actively participate in an intensive therapy program of at least 3 hrs of therapy per day at least 5 days per week? Yes 6. The potential for patient to make measurable gains while on inpatient rehab is excellent 7. Anticipated functional outcomes upon discharge from inpatient rehab are modified independent  with PT, modified independent with OT, independent with SLP. 8. Estimated rehab length of stay to reach the above functional goals is: 12-16 days 9. Anticipated discharge destination: Home 10. Overall Rehab/Functional Prognosis: excellent  RECOMMENDATIONS: This patient's condition is appropriate for continued rehabilitative care in the following setting: CIR Patient has agreed to participate in recommended program. Yes Note that insurance prior authorization may be required for reimbursement for recommended care.  Comment: Thank you for this consult. Admission coordinator to follow.   I have personally performed a face to face diagnostic evaluation, including, but not  limited to relevant history and physical exam findings, of this patient and developed relevant assessment and plan.  Additionally, I have reviewed and concur with the physician assistant's documentation above.  Leeroy Cha, MD  Cathlyn Parsons, PA-C 12/17/2020          Revision History                   Routing History                   Note Details  Author Izora Ribas, MD File Time 12/18/2020 2:07 PM  Author Type Physician Status Signed  Last Editor Izora Ribas, MD Service Physical Medicine and [place] # [phone] Admit Date 12/23/2020

## 2020-12-23 NOTE — Progress Notes (Signed)
Physical Therapy Treatment Patient Details Name: Evan Kirk MRN: [id number] DOB: 1941/06/18 Today's Date: 12/23/2020    History of Present Illness Pt presented to ED with c/o LE weakness, tingling, and burning as well as pain across his back.  MRI of brain showed small acute ischemic stroke at top of Lt motor strip.  Suspect bil. LE numbness and tingling is due to neuropathy, which is chronic, but has worsed due to immunotherapy for CA vs GBS.   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 Pt able to progress to ambulating an increased distance of ~50 ft one bout and ~25 ft another bout with a RW and minA for steadying and to assist at [place] for knee extension during stance to prevent LOB. Pt demonstrates decreased bilat step length secondary to his bilat quads endurance and strength deficits resulting in knee buckling (primarily on R) during stance, especially as distance progresses. HEP verbally provided to perform LAQ to address this issue. Will continue to follow acutely. Current recommendations remain appropriate.  Follow Up Recommendations  CIR     Equipment Recommendations  Wheelchair cushion (measurements PT);Wheelchair (measurements PT)    Recommendations for Other Services       Precautions / Restrictions Precautions Precautions: Fall;Other (comment) Precaution Comments: bradycardia Restrictions Weight Bearing Restrictions: No    Mobility  Bed Mobility Overal bed mobility: Needs Assistance Bed Mobility: Supine to Sit     Supine to sit: HOB elevated;Min guard     General bed mobility comments: Extra time and cues provided to manage legs off EOB and ascend trunk, min guard for safety.  Transfers Overall transfer level:  Needs assistance Equipment used: Rolling walker (2 wheeled) Transfers: Sit to/from Stand Sit to Stand: Min assist         General transfer comment: Extra time and minA to power up to stand and steady self, cuing for proper hand placement.  Ambulation/Gait Ambulation/Gait assistance: Min assist Gait Distance (Feet): 50 Feet (x2 bouts of ~25 ft > ~50 ft) Assistive device: Rolling walker (2 wheeled) Gait Pattern/deviations: Step-through pattern;Decreased step length - right;Decreased step length - left;Narrow base of support;Decreased stride length;Trunk flexed Gait velocity: reduced Gait velocity interpretation: <1.31 ft/sec, indicative of household ambulator General Gait Details: Slow, unsteady gait with decreased bilat step length. Provided verbal and tactile cues at bilat quads to facilitate knee extension in stance to allow for increased stance time and thus step length, with mod success. As distance progressed, pt's fatigue increased resulting in R knee buckling and minA to recover balance.   Stairs             Wheelchair Mobility    Modified Rankin (Stroke Patients Only) Modified Rankin (Stroke Patients Only) Pre-Morbid Rankin Score: No significant disability Modified Rankin: Moderately severe disability     Balance Overall balance assessment: Needs assistance Sitting-balance support: Feet supported;No upper extremity supported Sitting balance-Leahy Scale: Fair Sitting balance - Comments: No UE support, supervision for safety.   Standing balance support: Bilateral upper extremity supported;During functional activity Standing balance-Leahy Scale: Poor Standing balance comment: Reliant on UE support on RW.                            Cognition  Arousal/Alertness: Awake/alert Behavior During Therapy: WFL for tasks assessed/performed Overall Cognitive Status: Impaired/Different from baseline Area of Impairment: Safety/judgement                          Safety/Judgement: Decreased awareness of safety;Decreased awareness of deficits     General Comments: Poor safety awareness as pt will continue to ambulate even when evidently getting fatigued enough for knees to buckle, placing him at risk for injury.      Exercises      General Comments General comments (skin integrity, edema, etc.): Educated pt to perform LAQs as HEP to inc quads endurance and strength for improved gait stability      Pertinent Vitals/Pain Pain Assessment: 0-10 Pain Score: 6  Pain Location: L knee Pain Descriptors / Indicators: Aching;Grimacing;Guarding Pain Intervention(s): Limited activity within patient's tolerance;Monitored during session;Repositioned;Patient requesting pain meds-RN notified    Home Living                      Prior Function            PT Goals (current goals can now be found in the care plan section) Acute Rehab PT Goals Patient Stated Goal: to improve PT Goal Formulation: With patient Time For Goal Achievement: 12/30/20 Potential to Achieve Goals: Good Progress towards PT goals: Progressing toward goals    Frequency    Min 4X/week      PT Plan Current plan remains appropriate    Co-evaluation              AM-PAC PT "6 Clicks" Mobility   Outcome Measure  Help needed turning from your back to your side while in a flat bed without using bedrails?: A Little Help needed moving from lying on your back to sitting on the side of a flat bed without using bedrails?: A Little Help needed moving to and from a bed to a chair (including a wheelchair)?: A Little Help needed standing up from a chair using your arms (e.g., wheelchair or bedside chair)?: A Little Help needed to walk in hospital room?: A Little Help needed climbing 3-5 steps with a railing? : A Lot 6 Click Score: 17    End of Session Equipment Utilized During Treatment: Gait belt Activity Tolerance: Patient tolerated treatment well Patient  left: in chair;with call bell/phone within reach;with chair alarm set Nurse Communication: Mobility status;Patient requests pain meds PT Visit Diagnosis: Unsteadiness on feet (R26.81);Muscle weakness (generalized) (M62.81);Other abnormalities of gait and mobility (R26.89);Difficulty in walking, not elsewhere classified (R26.2);Other symptoms and signs involving the nervous system (J85.631)     Time: 4970-2637 PT Time Calculation (min) (ACUTE ONLY): 29 min  Charges:  $Gait Training: 23-37 mins                     Raymond Gurney, PT, DPT Acute Rehabilitation Services  Pager: [phone] Office: [phone]    Jewel Baize 12/23/2020, 12:15 PM

## 2020-12-23 NOTE — Progress Notes (Signed)
Patient arrive on the unit in transport chair with son at bedside. A&O x4. Denied pain and discomfort at the time. Assessment done, vital stable. Patient educated on safety plan and POC. Bed in low position and alarm on, call bell within reach. We continue to monitor.

## 2020-12-23 NOTE — TOC Transition Note (Signed)
Transition of Care [facility]) - CM/SW Discharge Note   Patient Details  Name: Evan Kirk MRN: [id number] Date of Birth: January 13, 1941  [facility] CM/SW Contact:  Kermit Balo, RN Phone Number: 12/23/2020, 11:41 AM   Clinical Narrative:    Pt discharging to CIR today. CM signing off.    Final next level of care: IP Rehab Facility Barriers to Discharge: No Barriers Identified   Patient Goals and CMS Choice     Choice offered to / list presented to : Patient  Discharge Placement                       Discharge Plan and Services                                     Social Determinants of Health (SDOH) Interventions     Readmission Risk Interventions No flowsheet data found.

## 2020-12-23 NOTE — Progress Notes (Signed)
Inpatient Rehabilitation  Patient information reviewed and entered into eRehab system by Karin Griffith M. Deforrest Bogle, M.A., CCC/SLP, PPS Coordinator.  Information including medical coding, functional ability and quality indicators will be reviewed and updated through discharge.    

## 2020-12-23 NOTE — H&P (Signed)
Physical Medicine and Rehabilitation Admission H&P    Chief Complaint  Patient presents with  . Weakness  : HPI: Evan Kirk is a 80 year old right-handed male with history of atrial fibrillation maintained on Eliquis, hypertension, OSA, renal cancer with left nephrectomy 2016 with local recurrence of tumor surrounding IVC/aorta undergoing immunotherapy receiving chemo via oncology [facility] as well as [facility] [phone] as well as Dr. Thedore Mins, BPH, diastolic congestive heart failure, essential tremor, prediabetes. History taken from chart review and patient. Patient lives with spouse.  1 level home ramped entrance.  Independent with assistive device.  Wife unable physically to assist. He presented on 12/15/2020 with bilateral lower extremity weakness, right greater than left of acute onset.  CT/MRI showed small focus of acute ischemia in the superior left hemisphere. No hemorrhage or mass-effect.  CT angiogram of head and neck no emergent large vessel occlusion or stenosis.  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  No evidence of metastatic disease to the thoracic or lumbar spine.  Mild edema and contrast enhancement at the endplates of T5-67-8 T10-11 without discrete masslike lesion. Echocardiogram with ejection fraction 60-65%, no wall motion abnormalities.  Admission chemistries unremarkable except sodium 134, glucose 100, hemoglobin 12.7.  Neurology follow-up maintained on intravenous heparin transitioned to Eliquis.  Physical medicine rehab consult requested to assess candidacy for CIR given lower extremity weakness. Patient was admitted for a comprehensive rehab program. Please see preadmission assessment from earlier today as well.  Review of Systems  Constitutional: Positive for malaise/fatigue. Negative for  chills and fever.  HENT: Negative for hearing loss.   Eyes: Negative for blurred vision and double vision.  Respiratory: Negative for cough.   Cardiovascular: Negative for chest pain.  Gastrointestinal: Positive for constipation. Negative for heartburn, nausea and vomiting.  Genitourinary: Positive for urgency. Negative for dysuria, flank pain and hematuria.  Musculoskeletal: Positive for back pain.  Neurological: Positive for tremors, focal weakness and weakness. Negative for speech change.  Psychiatric/Behavioral:       PTSD  All other systems reviewed and are negative.  Past Medical History:  Diagnosis Date  . Anticoagulated on Coumadin    [facility] by [facility]  . Atrial fibrillation (HCC)   . Back pain   . Bladder mass   . BPH (benign prostatic hypertrophy)   . Chronic atrial fibrillation (HCC)   . Diastolic CHF, chronic [facility])    cardiologist-- dr Verdis Prime-- asymptomatic  . Diverticulosis of colon   . Edema   . Essential tremor 06-2015  . History of urinary retention   . HTN (hypertension)   . OSA on CPAP   . Peripheral neuropathy    BILATERAL FEET  . Prediabetes   . PTSD (post-traumatic stress disorder)   . Renal cell carcinoma of left kidney [facility])    Past Surgical History:  Procedure Laterality Date  . CYSTOSCOPY WITH BIOPSY N/A 10/09/2015   Procedure: CYSTOSCOPY WITH BIOPSY;  Surgeon: Malen Gauze, MD;  Location: [facility];  Service: Urology;  Laterality: N/A;  . NEPHRECTOMY Left   . NEPHRECTOMY RADICAL Left 08/13/2015   [facility]-  . TOTAL KNEE ARTHROPLASTY Right 11-10-2008  . TRANSTHORACIC ECHOCARDIOGRAM  08-12-2015      moderate concentric LVH, ef 50-55-%/  trivial MR , PR and TR/  severe LAE and RAE  .  VASECTOMY REVERSAL  1978   Family History  Problem Relation Age of Onset  . Stroke Mother    Social History:  reports that he quit smoking about 54 years ago. He has quit using smokeless tobacco. He reports that he does not  drink alcohol and does not use drugs. Allergies:  Allergies  Allergen Reactions  . Pembrolizumab Other (See Comments)    1) severe neuropathy 2) may or may not be causing GBS as well, this still TBD, see 12/16/2020 admission for further details  . Diltiazem Other (See Comments)    Pt didn't feel well    Medications Prior to Admission  Medication Sig Dispense Refill  . acetaminophen (TYLENOL) 500 MG tablet Take 500 mg by mouth every 6 (six) hours as needed for mild pain.    Marland Kitchen apixaban (ELIQUIS) 5 MG TABS tablet Take 5 mg by mouth 2 (two) times daily.    Marland Kitchen atorvastatin (LIPITOR) 40 MG tablet Take 1 tablet (40 mg total) by mouth daily. 30 tablet 0  . benzoyl peroxide 5 % gel Apply 1 application topically daily as needed (skin irritation).    . carboxymethylcellulose (REFRESH PLUS) 0.5 % SOLN Place 1 drop into both eyes 3 (three) times daily as needed (dry eyes).    . cholecalciferol (VITAMIN D) 1000 UNITS tablet Take 2,000 Units by mouth daily.    . hydrocortisone 2.5 % lotion Apply topically 2 (two) times daily.    Marland Kitchen ketoconazole (NIZORAL) 2 % shampoo Apply 1 application topically 2 (two) times a week.    . lactulose (CHRONULAC) 10 GM/15ML solution Take 30 mLs (20 g total) by mouth once for 1 dose. 236 mL 0  . loratadine (CLARITIN) 10 MG tablet Take 10 mg by mouth daily as needed for allergies.     . metroNIDAZOLE (METROCREAM) 0.75 % cream Apply 1 application topically daily as needed. (roscea)    . Multiple Vitamin (MULTIVITAMIN) capsule Take 1 capsule by mouth daily.    . NON FORMULARY Peripheral Neuropathy Cream: Bupivacaine 1% Doxepin 3% Gabapentin 6% Pentoxifyline 3% Topiramate 1% Sig: Apply 1 to 2 gm to affected areas 3-4x daily prn  Refill 1    . polyethylene glycol (MIRALAX / GLYCOLAX) 17 g packet Take 17 g by mouth daily. 14 each 0  . pregabalin (LYRICA) 25 MG capsule Take 1 capsule (25 mg total) by mouth 2 (two) times daily. 60 capsule 0  . senna (SENOKOT) 8.6 MG tablet Take 1  tablet by mouth at bedtime.    . vitamin B-12 (CYANOCOBALAMIN) 500 MCG tablet Take 2,000 mcg by mouth daily.      Drug Regimen Review Drug regimen was reviewed and remains appropriate with no significant issues identified  Home: Home Living Family/patient expects to be discharged to:: Private residence Living Arrangements: Spouse/significant other Available Help at Discharge: Family Type of Home: House Home Access: Ramped entrance Home Layout: One level Bathroom Shower/Tub: Engineer, manufacturing systems: Handicapped height Home Equipment: Grab bars - tub/shower,Grab bars - toilet,Cane - single point,Walker - 2 wheels Additional Comments: Wife unable to physically assist  Lives With: Spouse   Functional History: Prior Function Level of Independence: Independent with assistive device(s) Comments: Was using RW since having weakness.  Functional Status:  Mobility: Bed Mobility Overal bed mobility: Needs Assistance Bed Mobility: Supine to Sit Sidelying to sit: Total assist,+2 for physical assistance,HOB elevated Supine to sit: HOB elevated,Min guard Sit to supine: Min assist General bed mobility comments: Extra time and cues provided to manage  legs off EOB and ascend trunk, min guard for safety. Transfers Overall transfer level: Needs assistance Equipment used: Rolling walker (2 wheeled) Transfers: Sit to/from Stand Sit to Stand: Min assist Stand pivot transfers: Mod assist,+2 physical assistance General transfer comment: Extra time and minA to power up to stand and steady self, cuing for proper hand placement. Ambulation/Gait Ambulation/Gait assistance: Min assist Gait Distance (Feet): 50 Feet (x2 bouts of ~25 ft > ~50 ft) Assistive device: Rolling walker (2 wheeled) Gait Pattern/deviations: Step-through pattern,Decreased step length - right,Decreased step length - left,Narrow base of support,Decreased stride length,Trunk flexed General Gait Details: Slow, unsteady gait  with decreased bilat step length. Provided verbal and tactile cues at bilat quads to facilitate knee extension in stance to allow for increased stance time and thus step length, with mod success. As distance progressed, pt's fatigue increased resulting in R knee buckling and minA to recover balance. Gait velocity: reduced Gait velocity interpretation: <1.31 ft/sec, indicative of household ambulator    ADL: ADL Overall ADL's : Needs assistance/impaired Eating/Feeding: Independent,Bed level Eating/Feeding Details (indicate cue type and reason): setup for meal and MD arriving Grooming: Minimal assistance,Sitting Grooming Details (indicate cue type and reason): pt sleeping and R eye appears closed with secretions. OT total (A) to help with arousal and opening of eye Upper Body Bathing: Minimal assistance,Sitting Lower Body Bathing: Moderate assistance,Sit to/from stand Upper Body Dressing : Minimal assistance,Sitting Lower Body Dressing: Total assistance Toilet Transfer: Moderate assistance,+2 for physical assistance,+2 for safety/equipment,Stand-pivot,BSC Toilet Transfer Details (indicate cue type and reason): pt requires one therapist assisting guiding RW and hips on L side and one therapist on R side to maintain knee extension Toileting- Clothing Manipulation and Hygiene: Moderate assistance,Sit to/from stand Functional mobility during ADLs: Moderate assistance,+2 for physical assistance,+2 for safety/equipment,Rolling walker General ADL Comments: pt with decreased alertness at start of session requiring max multimodal cues and mobility initation to wake up. knee buckling limits safe ADL mobility progression  Cognition: Cognition Overall Cognitive Status: Impaired/Different from baseline Arousal/Alertness: Awake/alert Orientation Level: Oriented X4 Memory: Impaired Memory Impairment: Decreased recall of new information (4/4 words recalled independently, but Pt reports "struggle with  memory") Awareness: Appears intact Problem Solving: Appears intact Safety/Judgment: Appears intact Cognition Arousal/Alertness: Awake/alert Behavior During Therapy: WFL for tasks assessed/performed Overall Cognitive Status: Impaired/Different from baseline Area of Impairment: Safety/judgement Current Attention Level: Sustained Memory: Decreased recall of precautions Following Commands: Follows one step commands with increased time Safety/Judgement: Decreased awareness of safety,Decreased awareness of deficits Awareness: Emergent Problem Solving: Slow processing,Requires verbal cues,Difficulty sequencing General Comments: Poor safety awareness as pt will continue to ambulate even when evidently getting fatigued enough for knees to buckle, placing him at risk for injury.  Physical Exam: Blood pressure 130/60, pulse 67, temperature 97.9 F (36.6 C), temperature source Oral, resp. rate 19, height 5\' 6"  (1.676 m), weight 102.1 kg, SpO2 96 %. Physical Exam Vitals reviewed.  Constitutional:      General: He is not in acute distress.    Appearance: Normal appearance.  HENT:     Head: Normocephalic and atraumatic.     Right Ear: External ear normal.     Left Ear: External ear normal.     Nose: Nose normal.  Eyes:     General:        Right eye: No discharge.        Left eye: No discharge.     Extraocular Movements: Extraocular movements intact.  Cardiovascular:     Comments: Irregularly irregular Pulmonary:  Effort: Pulmonary effort is normal. No respiratory distress.     Breath sounds: No stridor.  Abdominal:     General: Abdomen is flat. Bowel sounds are normal. There is no distension.  Musculoskeletal:     Cervical back: Normal range of motion and neck supple.     Comments: No edema or tenderness in extremities  Skin:    General: Skin is warm and dry.  Neurological:     Mental Status: He is alert.     Comments: Alert and oriented x3 Follows commands Motor: Bilateral  upper extremities: 5/5 proximal distal Right lower extremity: Hip flexion, knee extension 2/5, dorsiflexion 4/5 Left lower extremity: Hip flexion, knee extension 2+/5 plantarflexion 4/5 Left facial weakness  Psychiatric:        Mood and Affect: Mood normal.        Behavior: Behavior normal.     No results found for this or any previous visit (from the past 48 hour(s)). No results found.  Medical Problem List and Plan: 1. Bilateral lower extremity weakness secondary to multifocal mononeuropathy.  -patient may shower  -ELOS/Goals: 14-17 days/Supervision/min a  Admit to CIR 2.  Antithrombotics: -DVT/anticoagulation: Eliquis  -antiplatelet therapy: N/A 3. Pain Management: Lyrica 25 mg twice daily 4. Mood: Provide emotional support.    -antipsychotic agents: N/A 5. Neuropsych: This patient is capable of making decisions on his own behalf. 6. Skin/Wound Care: Routine skin checks 7. Fluids/Electrolytes/Nutrition: Routine in and outs  CMP ordered for tomorrow. 8.  Atrial fibrillation.  Continue Eliquis.  Cardiac rate controlled  Monitor with increased activity 9.  OSA.  CPAP 10.  Recurrent renal carcinoma.  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 Hyperlipidemia: Lipitor 12. [place] acute ischemic infarct left cerebral hemisphere superiorly  Exacerbating multifocal mononeuropathy 14. Hyponatremia  Sodium 134 on 1/3, CMP ordered for tomorrow   Cathlyn Parsons, PA-C 12/23/2020  I have personally performed a face to face diagnostic evaluation, including, but not limited to relevant history and physical exam findings, of this patient and developed relevant assessment and plan.  Additionally, I have reviewed and concur with the physician assistant's documentation above.  Delice Lesch, MD, ABPMR  The patient's status has not changed. Any changes from the pre-admission screening or  documentation from the acute chart are noted above.   Delice Lesch, MD, ABPMR

## 2020-12-23 NOTE — Progress Notes (Signed)
Jamse Arn, MD  Physician  Physical Medicine and Rehabilitation  PMR Pre-admission    Addendum  Date of Service:  12/23/2020 10:13 AM      Related encounter: ED to Hosp-Admission (Discharged) from 12/15/2020 in [facility]          Show:Clear all [x] Manual[x] Template[x] Copied  Added by: [x] Michel Santee, PT   [] Hover for details  PMR Admission Coordinator Pre-Admission Assessment  Patient: Evan Kirk is an 80 y.o., male MRN: [id number] DOB: 05/18/1941 Height: 5' 6"  (167.6 cm) Weight: 102.1 kg                                                                                                                                                  Insurance Information HMO: yes    PPO:      PCP:      IPA:      80/20:      OTHER:  PRIMARY: U[employer] Medicare      Policy#: [id number]      Subscriber: pt CM Name: Darrick Penna      Phone#: [phone]     Fax#: [phone] Pre-Cert#: [id number] auth for CIR provided by Darrick Penna at [facility] with updates due on 1/10      Employer:  Benefits:  Phone #: [phone]     Name:  Eff. Date: 12/19/20     Deduct: n/a      Out of Pocket Max: $4500 ($0 met)       Life Max: n/a  CIR: $325/day for days 1-5      SNF: 20 full days Outpatient:      Co-Pay: $30/visit Home Health: 100%      Co-Pay:  DME: 80%     Co-Ins: 20% Providers:  SECONDARY: [facility]      Policy#:       Phone#:   Development worker, community:       Phone#:   The Engineer, petroleum" for patients in Inpatient Rehabilitation Facilities with attached "Privacy Act [employer] Records" was provided and verbally reviewed with: Patient  Emergency Contact Information         Contact Information    Name Relation Home Work Mobile   [place] Spouse [phone]       Current Medical History  Patient Admitting Diagnosis: CVA  History of Present Illness: Evan Kirk is a 80 year old right-handed male with history of  atrial fibrillation maintained on Eliquis, hypertension, OSA, renal cancer with left nephrectomy 2016 with local recurrence of tumor surrounding IVC/aorta (undergoing immunotherapy receiving chemo via oncology [facility] as well as [place] as well as Dr. Candiss Norse), BPH, diastolic congestive heart failure, essential tremor, prediabetes. Presented 12/15/2020 with bilateral extremity weakness right greater than left of acute onset. CT/MRI showed small focus of acute ischemia in the superior left hemisphere. No  hemorrhage or mass-effect. CT angiogram of head and neck no emergent large vessel occlusion or stenosis. 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 No evidence of metastatic disease to the thoracic or lumbar spine. Mild edema and contrast enhancement at the endplates of M2-26-3 F35-45 without discrete masslike lesion. Echocardiogram with ejection fraction of 60 to 65% no wall motion abnormalities. Admission chemistries unremarkable except sodium 134 glucose 100 hemoglobin 12.7. Neurology follow-up maintained on intravenous heparin transitioned to Eliquis. Therapy evaluations were completed and pt was recommended for CIR.   Complete NIHSS TOTAL: 0 Glasgow Coma Scale Score: 15  Past Medical History      Past Medical History:  Diagnosis Date  . Anticoagulated on Coumadin    followed by [facility]  . Atrial fibrillation ([place])   . Back pain   . Bladder mass   . BPH (benign prostatic hypertrophy)   . Chronic atrial fibrillation ([place])   . Diastolic CHF, chronic [facility])    cardiologist-- dr Daneen Schick-- asymptomatic  . Diverticulosis of colon   . Edema   . Essential tremor 06-2015  . History of urinary retention   . HTN (hypertension)   . OSA on CPAP   . Peripheral neuropathy     BILATERAL FEET  . Prediabetes   . PTSD (post-traumatic stress disorder)   . Renal cell carcinoma of left kidney (HCC)     Family History  family history includes Stroke in his mother.  Prior Rehab/Hospitalizations:  Has the patient had prior rehab or hospitalizations prior to admission? No  Has the patient had major surgery during 100 days prior to admission? No  Current Medications   Current Facility-Administered Medications:  .  acetaminophen (TYLENOL) tablet 500 mg, 500 mg, Oral, Q6H PRN, Etta Quill, DO, 500 mg at 12/22/20 1200 .  apixaban (ELIQUIS) tablet 5 mg, 5 mg, Oral, BID, Kirby-Graham, Karsten Fells, NP, 5 mg at 12/22/20 2235 .  atorvastatin (LIPITOR) tablet 40 mg, 40 mg, Oral, Daily, Oretha Milch D, MD, 40 mg at 12/22/20 1200 .  cholecalciferol (VITAMIN D3) tablet 2,000 Units, 2,000 Units, Oral, Daily, Etta Quill, DO, 2,000 Units at 12/22/20 1201 .  hydrocortisone (ANUSOL-HC) 2.5 % rectal cream, , Topical, BID, Etta Quill, DO, Given at 12/22/20 2235 .  ketoconazole (NIZORAL) 2 % shampoo 1 application, 1 application, Topical, Once per day on Mon Thu, Gardner, Jared M, DO, 1 application at 62/56/38 1103 .  loratadine (CLARITIN) tablet 10 mg, 10 mg, Oral, Daily PRN, Etta Quill, DO .  multivitamin with minerals tablet 1 tablet, 1 tablet, Oral, Daily, Etta Quill, DO, 1 tablet at 12/22/20 1201 .  polyvinyl alcohol (LIQUIFILM TEARS) 1.4 % ophthalmic solution 1 drop, 1 drop, Both Eyes, TID PRN, Etta Quill, DO, 1 drop at 12/22/20 0123 .  pregabalin (LYRICA) capsule 25 mg, 25 mg, Oral, BID, Etta Quill, DO, 25 mg at 12/22/20 2235 .  senna (SENOKOT) tablet 8.6 mg, 1 tablet, Oral, QHS, Gardner, Jared M, DO, 8.6 mg at 12/22/20 2235 .  vitamin B-12 (CYANOCOBALAMIN) tablet 2,000 mcg, 2,000 mcg, Oral, Daily, Swayze, Ava, DO, 2,000 mcg at 12/22/20 1202  Patients Current Diet:     Diet Order                  Diet Heart Room service  appropriate? Yes; Fluid consistency:  Thin  Diet effective now                  Precautions / Restrictions Precautions Precautions: Fall,Other (comment) Precaution Comments: bradycardia Restrictions Weight Bearing Restrictions: No   Has the patient had 2 or more falls or a fall with injury in the past year?Yes  Prior Activity Level Limited Community (1-2x/wk): have been getting out less the last 2 years, but still driving, intermittently using a cane/RW due to neuropathy in LEs, primary caregiver for wife (no physical assist)  Prior Functional Level Prior Function Level of Independence: Independent with assistive device(s) Comments: Was using RW since having weakness.  Self Care: Did the patient need help bathing, dressing, using the toilet or eating?  Independent  Indoor Mobility: Did the patient need assistance with walking from room to room (with or without device)? Independent  Stairs: Did the patient need assistance with internal or external stairs (with or without device)? Independent  Functional Cognition: Did the patient need help planning regular tasks such as shopping or remembering to take medications? Independent  Home Assistive Devices / Equipment Home Equipment: Grab bars - tub/shower,Grab bars - toilet,Cane - single point,Walker - 2 wheels  Prior Device Use: Indicate devices/aids used by the patient prior to current illness, exacerbation or injury? Walker  Current Functional Level Cognition  Arousal/Alertness: Awake/alert Overall Cognitive Status: Impaired/Different from baseline Current Attention Level: Sustained Orientation Level: Oriented X4 Following Commands: Follows one step commands with increased time Safety/Judgement: Decreased awareness of safety,Decreased awareness of deficits General Comments: Poor safety awareness as pt reports getting in chair by himself, "i tried not to get tangled in these lines." Memory: Impaired Memory  Impairment: Decreased recall of new information (4/4 words recalled independently, but Pt reports "struggle with memory") Awareness: Appears intact Problem Solving: Appears intact Safety/Judgment: Appears intact    Extremity Assessment (includes Sensation/Coordination)  Upper Extremity Assessment: LUE deficits/detail LUE Deficits / Details: requires (A) to place hand on abdomen, min (A) to elevate shoulder and place hand on RW  Lower Extremity Assessment: RLE deficits/detail RLE Deficits / Details: Grossly 3+/5 at the hip and knee, 4/5 at ankle    ADLs  Overall ADL's : Needs assistance/impaired Eating/Feeding: Independent,Bed level Eating/Feeding Details (indicate cue type and reason): setup for meal and MD arriving Grooming: Minimal assistance,Sitting Grooming Details (indicate cue type and reason): pt sleeping and R eye appears closed with secretions. OT total (A) to help with arousal and opening of eye Upper Body Bathing: Minimal assistance,Sitting Lower Body Bathing: Moderate assistance,Sit to/from stand Upper Body Dressing : Minimal assistance,Sitting Lower Body Dressing: Total assistance Toilet Transfer: Moderate assistance,+2 for physical assistance,+2 for safety/equipment,Stand-pivot,BSC Toilet Transfer Details (indicate cue type and reason): pt requires one therapist assisting guiding RW and hips on L side and one therapist on R side to maintain knee extension Toileting- Clothing Manipulation and Hygiene: Moderate assistance,Sit to/from stand Functional mobility during ADLs: Moderate assistance,+2 for physical assistance,+2 for safety/equipment,Rolling walker General ADL Comments: pt with decreased alertness at start of session requiring max multimodal cues and mobility initation to wake up. knee buckling limits safe ADL mobility progression    Mobility  Overal bed mobility: Needs Assistance Bed Mobility: Sidelying to Sit Sidelying to sit: Total assist,+2 for physical  assistance,HOB elevated Supine to sit: Mod assist Sit to supine: Min assist General bed mobility comments: Up in chair upon PT arrival.    Transfers  Overall transfer level: Needs assistance Equipment used: Rolling walker (2 wheeled) Transfers: Sit to/from Stand  Sit to Stand: Min assist Stand pivot transfers: Mod assist,+2 physical assistance General transfer comment: Assist to power to standing with cues for hand placement. Stood from Automotive engineer.    Ambulation / Gait / Stairs / Wheelchair Mobility  Ambulation/Gait Ambulation/Gait assistance: Herbalist (Feet): 22 Feet (+ 16') Assistive device: Rolling walker (2 wheeled) Gait Pattern/deviations: Step-through pattern,Decreased step length - right,Decreased step length - left,Narrow base of support General Gait Details: Slow, unsteady and shortened step through gait with support for right knee; bil knees buckling needing assist to sit, 1 seated rest break. A mild episode of unresponsiveness on second bout requiring max cues to assist pt into chair. poor ability to self monitor symptoms. Gait velocity: reduced Gait velocity interpretation: <1.31 ft/sec, indicative of household ambulator    Posture / Balance Dynamic Sitting Balance Sitting balance - Comments: supervision during MMT Balance Overall balance assessment: Needs assistance Sitting-balance support: Feet supported,No upper extremity supported Sitting balance-Leahy Scale: Fair Sitting balance - Comments: supervision during MMT Standing balance support: During functional activity Standing balance-Leahy Scale: Poor Standing balance comment: reliant on BUE support of RW and Min-Mod A once fatigued due to bil knees buckling    Special needs/care consideration CPAP     Previous Home Environment (from acute therapy documentation) Living Arrangements: Spouse/significant other  Lives With: Spouse Available Help at Discharge: Family Type of Home: House Home  Layout: One level Home Access: Ramped entrance Bathroom Shower/Tub: Chiropodist: Handicapped height Additional Comments: Wife unable to physically assist  Discharge Living Setting Plans for Discharge Living Setting: Patient's home,Lives with (comment) (wife) Type of Home at Discharge: House Discharge Home Layout: One level Discharge Home Access: Ramped entrance Discharge Bathroom Shower/Tub: Tub/shower unit Discharge Bathroom Toilet: Handicapped height Discharge Bathroom Accessibility: Yes How Accessible: Accessible via walker Does the patient have any problems obtaining your medications?: No  Social/Family/Support Systems Patient Roles: Spouse,Caregiver Anticipated Caregiver: pt with mod I goals, but son's have been staying with his wife on a rotation (there are 5) while he's been in the hospital Anticipated Caregiver's Contact Information: Katharine Look: [phone] Ability/Limitations of Caregiver: wife cannot provide physical assist Caregiver Availability: 24/7 Discharge Plan Discussed with Primary Caregiver: Yes Is Caregiver In Agreement with Plan?: Yes Does Caregiver/Family have Issues with Lodging/Transportation while Pt is in Rehab?: No   Goals Patient/Family Goal for Rehab: PT/OT/SLP mod I Expected length of stay: 7-10 days Pt/Family Agrees to Admission and willing to participate: Yes Program Orientation Provided & Reviewed with Pt/Caregiver Including Roles  & Responsibilities: Yes  Barriers to Discharge: Insurance for SNF coverage   Decrease burden of Care through IP rehab admission: n/a  Possible need for SNF placement upon discharge: not anticipated  Patient Condition: This patient's medical and functional status has changed since the consult dated: 12/18/20 in which the Rehabilitation Physician determined and documented that the patient's condition is appropriate for intensive rehabilitative care in an inpatient rehabilitation facility. See  "History of Present Illness" (above) for medical update. Functional changes are: pt min assist +2 with mobility and pre-gait. Patient's medical and functional status update has been discussed with the Rehabilitation physician and patient remains appropriate for inpatient rehabilitation. Will admit to inpatient rehab today.  Preadmission Screen Completed By:  Michel Santee, PT, 12/23/2020 10:13 AM ______________________________________________________________________   Discussed status with Dr. Posey Pronto on 12/23/20 at 10:31 AM and received approval for admission today.  Admission Coordinator:  Michel Santee, time 10:31 AM Evan Kirk 12/23/20  Revision History                             Note Details  Author Jamse Arn, MD File Time 12/23/2020 10:33 AM  Author Type Physician Status Addendum  Last Editor Jamse Arn, MD Service Physical Medicine and [place] # [id number] Admit Date 12/23/2020

## 2020-12-23 NOTE — Discharge Summary (Addendum)
Physician Discharge Summary  Evan Kirk [id number] DOB: 10-22-1941 DOA: 12/15/2020  PCP: Evan Cruel, MD  Admit date: 12/15/2020 Discharge date: 12/23/2020  Recommendations for Outpatient Follow-up:  Discharge to inpatient rehab Follow up with neurology as outpatient for EMG and neural conduction study. Follow up with PCP in 7-10 days after discharge from rehab. Follow up with oncology at [facility] at soonest possible appointment.  Discharge Diagnoses: Principal diagnosis is #1 1. Small subacute ischemic CVA 2. 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 Chronic atrial fibrillation 5. Bradycardia - aysmptomatic 6. OSA 7. Debility  Discharge Condition: Fair  Disposition: CIR  Diet recommendation: Heart healthy and carbohydrate modified  Filed Weights   12/15/20 1612 12/16/20 0236  Weight: 102.1 kg 102.1 kg    History of present illness:  Evan Kirk is a 80 y.o. male with medical history significant of a.fib on eliquis, HTN, OSA, renal cancer with local recurrence, tumor surrounding IVC, pt started immunotherapy due to tumor recurrence.  Had first dose of immunotherapy with pembrolizumab / axitinib earlier this month.  Pt presents to ED with c/o leg weakness, tingling, and burning.  Symptoms ongoing for about 48h, symptoms constant, nothing makes better or worse.  Pt feels like RLE is weaker than LLE but burning and tingling are in BLE.  Has been having mid to low thoracic back pain across his back.  He has been able to ambulate with a walker with the BLE weakness.  No headache, no abd pain.   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 He is recently started immunotherapy at [facility] using pembrolizumab/axitinib. He presented to the ED 12/29 with leg weakness tingling and burning for 48 hours with the right leg greater than left leg weakness as well.  CT demonstrated a retroperitoneal mass that is encasing the IVC. Neurology does not feel that this is related to the patient's bilateral lower extremity weakness  In theEDCT head was unremarkable. CT thoracic lumbar spine showed mass as described below. MRI brain shows a punctate acute infarct in the superior left frontal hemisphere, close to the motor strip. No bleeding. No mass-effect. MRI of the thoracic and lumbar spine with no evidence of spinal cord infarct.  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 Neurology has recommended EMG and neural coduction study as outpatient. The patient has been evalated by PT/OT. Their recommendation is for CIR.   There is a bed for him today. He will be discharged to CIR.  Today's assessment: S: The patient is resting comfortably. No new complaints. He still has questions for neurology. I have asked them to come back and address them. O: Vitals:  Vitals:   12/23/20 0300 12/23/20 0749  BP: (!) 139/54 130/60  Pulse:  67  Resp:  19  Temp: 97.6 F (36.4 C) 97.9 F (36.6 C)  SpO2:  96%   Exam:  Constitutional:  . The patient is awake, alert, and oriented x 3. No acute distress. Respiratory:  . No increased work of breathing. . No wheezes, rales, or rhonchi . No tactile fremitus Cardiovascular:  . Regular rate and rhythm .  No murmurs, ectopy, or gallups. . No lateral PMI. No thrills. Abdomen:  . Abdomen is soft, non-tender, non-distended . No hernias, masses, or organomegaly . Normoactive bowel sounds.  Musculoskeletal:  . No cyanosis, clubbing, or edema Skin:  . No rashes, lesions,  ulcers . palpation of skin: no induration or nodules Neurologic:  . CN 2-12 intact . Sensation all 4 extremities intact Psychiatric:  . Mental status o Mood, affect appropriate o Orientation to person, place, time  . judgment and insight appear intact   Discharge Instructions  Discharge Instructions    Activity as tolerated - No restrictions   Complete by: As directed    Call MD for:  persistant nausea and vomiting   Complete by: As directed    Call MD for:  severe uncontrolled pain   Complete by: As directed    Diet - low sodium heart healthy   Complete by: As directed    Discharge instructions   Complete by: As directed    Discharge to inpatient rehab Follow up with neurology as outpatient for EMG and neural conduction study. Follow up with PCP in 7-10 days after discharge from rehab. Follow up with oncology at [facility] at soonest possible appointment.   Increase activity slowly   Complete by: As directed      Allergies as of 12/23/2020      Reactions   Pembrolizumab Other (See Comments)   1) severe neuropathy 2) may or may not be causing GBS as well, this still TBD, see 12/16/2020 admission for further details   Diltiazem Other (See Comments)   Pt didn't feel well       Medication List    STOP taking these medications   metoprolol tartrate 50 MG tablet Commonly known as: LOPRESSOR   metroNIDAZOLE 500 MG tablet Commonly known as: FLAGYL   OVER THE COUNTER MEDICATION   traZODone 100 MG tablet Commonly known as: DESYREL     TAKE these medications   acetaminophen 500 MG tablet Commonly known as: TYLENOL Take 500 mg by mouth every 6 (six) hours as needed for mild pain.   apixaban 5 MG Tabs tablet Commonly known as: ELIQUIS Take 5 mg by mouth 2 (two) times daily.   atorvastatin 40 MG tablet Commonly known as: LIPITOR Take 1 tablet (40 mg total) by mouth daily.   benzoyl peroxide 5 % gel Apply 1 application topically daily as needed (skin irritation).    carboxymethylcellulose 0.5 % Soln Commonly known as: REFRESH PLUS Place 1 drop into both eyes 3 (three) times daily as needed (dry eyes).   cholecalciferol 1000 units tablet Commonly known as: VITAMIN D Take 2,000 Units by mouth daily.   hydrocortisone 2.5 % lotion Apply topically 2 (two) times daily.   ketoconazole 2 % shampoo Commonly known as: NIZORAL Apply 1 application topically 2 (two) times a week.   lactulose 10 GM/15ML solution Commonly known as: CHRONULAC Take 30 mLs (20 g total) by mouth once for 1 dose.   loratadine 10 MG tablet Commonly known as: CLARITIN Take 10 mg by mouth daily as needed for allergies.   metroNIDAZOLE 0.75 % cream Commonly known as: METROCREAM Apply 1 application topically daily as needed. (roscea)   multivitamin capsule Take 1 capsule by mouth daily.   NON FORMULARY Peripheral Neuropathy Cream: Bupivacaine 1% Doxepin 3% Gabapentin 6% Pentoxifyline 3% Topiramate 1% Sig: Apply 1 to 2 gm to affected areas 3-4x daily prn  Refill 1   polyethylene glycol 17 g packet Commonly known  as: MIRALAX / GLYCOLAX Take 17 g by mouth daily.   pregabalin 25 MG capsule Commonly known as: LYRICA Take 1 capsule (25 mg total) by mouth 2 (two) times daily.   senna 8.6 MG tablet Commonly known as: SENOKOT Take 1 tablet by mouth at bedtime.   vitamin B-12 500 MCG tablet Commonly known as: CYANOCOBALAMIN Take 2,000 mcg by mouth daily.        Allergies  Allergen Reactions  . Pembrolizumab Other (See Comments)    1) severe neuropathy 2) may or may not be causing GBS as well, this still TBD, see 12/16/2020 admission for further details  . Diltiazem Other (See Comments)    Pt didn't feel well     The results of significant diagnostics from this hospitalization (including imaging, microbiology, ancillary and laboratory) are listed below for reference.    Significant Diagnostic Studies: CT ANGIO HEAD W OR WO CONTRAST  Result Date:  12/16/2020 CLINICAL DATA:  Weakness EXAM: CT ANGIOGRAPHY HEAD AND NECK TECHNIQUE: Multidetector CT imaging of the head and neck was performed using the standard protocol during bolus administration of intravenous contrast. Multiplanar CT image reconstructions and MIPs were obtained to evaluate the vascular anatomy. Carotid stenosis measurements (when applicable) are obtained utilizing NASCET criteria, using the distal internal carotid diameter as the denominator. CONTRAST:  26mL OMNIPAQUE IOHEXOL 350 MG/ML SOLN COMPARISON:  Head CT 12/15/2020 FINDINGS: CT HEAD FINDINGS Brain: There is no mass, hemorrhage or extra-axial collection. There is generalized atrophy without lobar predilection. There is no acute or chronic infarction. The brain parenchyma is normal. Skull: The visualized skull base, calvarium and extracranial soft tissues are normal. Sinuses/Orbits: No fluid levels or advanced mucosal thickening of the visualized paranasal sinuses. No mastoid or middle ear effusion. The orbits are normal. CTA NECK FINDINGS SKELETON: There is no bony spinal canal stenosis. No lytic or blastic lesion. OTHER NECK: Normal pharynx, larynx and major salivary glands. No cervical lymphadenopathy. Unremarkable thyroid gland. UPPER CHEST: No pneumothorax or pleural effusion. No nodules or masses. AORTIC ARCH: There is calcific atherosclerosis of the aortic arch. There is no aneurysm, dissection or hemodynamically significant stenosis of the visualized portion of the aorta. Conventional 3 vessel aortic branching pattern. The visualized proximal subclavian arteries are widely patent. RIGHT CAROTID SYSTEM: No dissection, occlusion or aneurysm. Mild atherosclerotic calcification at the carotid bifurcation without hemodynamically significant stenosis. LEFT CAROTID SYSTEM: No dissection, occlusion or aneurysm. Mild atherosclerotic calcification at the carotid bifurcation without hemodynamically significant stenosis. VERTEBRAL ARTERIES:  Left dominant configuration. Both origins are clearly patent. There is no dissection, occlusion or flow-limiting stenosis to the skull base (V1-V3 segments). CTA HEAD FINDINGS POSTERIOR CIRCULATION: --Vertebral arteries: Normal V4 segments. --Inferior cerebellar arteries: Normal. --Basilar artery: Normal. --Superior cerebellar arteries: Normal. --Posterior cerebral arteries (PCA): Normal. ANTERIOR CIRCULATION: --Intracranial internal carotid arteries: Normal. --Anterior cerebral arteries (ACA): Normal. Both A1 segments are present. Patent anterior communicating artery (a-comm). --Middle cerebral arteries (MCA): Normal. VENOUS SINUSES: As permitted by contrast timing, patent. ANATOMIC VARIANTS: None Review of the MIP images confirms the above findings. IMPRESSION: 1. No emergent large vessel occlusion or high-grade stenosis of the head or neck. Aortic Atherosclerosis (ICD10-I70.0). Electronically Signed   By: Ulyses Jarred M.D.   On: 12/16/2020 21:21   CT Head Wo Contrast  Result Date: 12/15/2020 CLINICAL DATA:  weakness EXAM: CT HEAD WITHOUT CONTRAST TECHNIQUE: Contiguous axial images were obtained from the base of the skull through the vertex without intravenous contrast. COMPARISON:  None. FINDINGS: Brain: No evidence of  acute territorial infarction, hemorrhage, hydrocephalus,extra-axial collection or mass lesion/mass effect. There is dilatation the ventricles and sulci consistent with age-related atrophy. Low-attenuation changes in the deep white matter consistent with small vessel ischemia. Vascular: No hyperdense vessel or unexpected calcification. Skull: The skull is intact. No fracture or focal lesion identified. Sinuses/Orbits: The visualized paranasal sinuses and mastoid air cells are clear. The orbits and globes intact. Other: None IMPRESSION: No acute intracranial abnormality. Findings consistent with age related atrophy and chronic small vessel ischemia Electronically Signed   By: Prudencio Pair M.D.    On: 12/15/2020 21:11   CT ANGIO NECK W OR WO CONTRAST  Result Date: 12/16/2020 CLINICAL DATA:  Weakness EXAM: CT ANGIOGRAPHY HEAD AND NECK TECHNIQUE: Multidetector CT imaging of the head and neck was performed using the standard protocol during bolus administration of intravenous contrast. Multiplanar CT image reconstructions and MIPs were obtained to evaluate the vascular anatomy. Carotid stenosis measurements (when applicable) are obtained utilizing NASCET criteria, using the distal internal carotid diameter as the denominator. CONTRAST:  28mL OMNIPAQUE IOHEXOL 350 MG/ML SOLN COMPARISON:  Head CT 12/15/2020 FINDINGS: CT HEAD FINDINGS Brain: There is no mass, hemorrhage or extra-axial collection. There is generalized atrophy without lobar predilection. There is no acute or chronic infarction. The brain parenchyma is normal. Skull: The visualized skull base, calvarium and extracranial soft tissues are normal. Sinuses/Orbits: No fluid levels or advanced mucosal thickening of the visualized paranasal sinuses. No mastoid or middle ear effusion. The orbits are normal. CTA NECK FINDINGS SKELETON: There is no bony spinal canal stenosis. No lytic or blastic lesion. OTHER NECK: Normal pharynx, larynx and major salivary glands. No cervical lymphadenopathy. Unremarkable thyroid gland. UPPER CHEST: No pneumothorax or pleural effusion. No nodules or masses. AORTIC ARCH: There is calcific atherosclerosis of the aortic arch. There is no aneurysm, dissection or hemodynamically significant stenosis of the visualized portion of the aorta. Conventional 3 vessel aortic branching pattern. The visualized proximal subclavian arteries are widely patent. RIGHT CAROTID SYSTEM: No dissection, occlusion or aneurysm. Mild atherosclerotic calcification at the carotid bifurcation without hemodynamically significant stenosis. LEFT CAROTID SYSTEM: No dissection, occlusion or aneurysm. Mild atherosclerotic calcification at the carotid  bifurcation without hemodynamically significant stenosis. VERTEBRAL ARTERIES: Left dominant configuration. Both origins are clearly patent. There is no dissection, occlusion or flow-limiting stenosis to the skull base (V1-V3 segments). CTA HEAD FINDINGS POSTERIOR CIRCULATION: --Vertebral arteries: Normal V4 segments. --Inferior cerebellar arteries: Normal. --Basilar artery: Normal. --Superior cerebellar arteries: Normal. --Posterior cerebral arteries (PCA): Normal. ANTERIOR CIRCULATION: --Intracranial internal carotid arteries: Normal. --Anterior cerebral arteries (ACA): Normal. Both A1 segments are present. Patent anterior communicating artery (a-comm). --Middle cerebral arteries (MCA): Normal. VENOUS SINUSES: As permitted by contrast timing, patent. ANATOMIC VARIANTS: None Review of the MIP images confirms the above findings. IMPRESSION: 1. No emergent large vessel occlusion or high-grade stenosis of the head or neck. Aortic Atherosclerosis (ICD10-I70.0). Electronically Signed   By: Ulyses Jarred M.D.   On: 12/16/2020 21:21   CT Angio Chest PE W and/or Wo Contrast  Result Date: 12/19/2020 CLINICAL DATA:  High probability of pulmonary embolus. EXAM: CT ANGIOGRAPHY CHEST WITH CONTRAST TECHNIQUE: Multidetector CT imaging of the chest was performed using the standard protocol during bolus administration of intravenous contrast. Multiplanar CT image reconstructions and MIPs were obtained to evaluate the vascular anatomy. CONTRAST:  40mL OMNIPAQUE IOHEXOL 350 MG/ML SOLN COMPARISON:  December 15, 2020. FINDINGS: Cardiovascular: Satisfactory opacification of the pulmonary arteries to the segmental level. No evidence of pulmonary embolism. Mild cardiomegaly is noted.  Coronary artery calcifications are noted. No pericardial effusion. Atherosclerosis of thoracic aorta is noted without aneurysm formation. Mediastinum/Nodes: No enlarged mediastinal, hilar, or axillary lymph nodes. Thyroid gland, trachea, and esophagus  demonstrate no significant findings. Lungs/Pleura: Lungs are clear. No pleural effusion or pneumothorax. Upper Abdomen: Cholelithiasis. Musculoskeletal: No chest wall abnormality. No acute or significant osseous findings. Review of the MIP images confirms the above findings. IMPRESSION: 1. No definite evidence of pulmonary embolus. 2. Coronary artery calcifications are noted suggesting coronary artery disease. 3. Cholelithiasis. 4. Aortic atherosclerosis. Aortic Atherosclerosis (ICD10-I70.0). Electronically Signed   By: Lupita Raider M.D.   On: 12/19/2020 19:38   MR BRAIN WO CONTRAST  Result Date: 12/16/2020 CLINICAL DATA:  Lower extremity weakness.  Renal cell carcinoma. EXAM: MRI HEAD WITHOUT CONTRAST TECHNIQUE: Multiplanar, multiecho pulse sequences of the brain and surrounding structures were obtained without intravenous contrast. COMPARISON:  Brain MRI 07/06/2016 FINDINGS: Brain: There is a small focus of acute ischemia in the superior left hemisphere. Focus of chronic microhemorrhage in the left basal ganglia. No acute hemorrhage. There is multifocal hyperintense T2-weighted signal within the white matter. Generalized volume loss without a clear lobar predilection. The midline structures are normal. Vascular: Major flow voids are preserved. Skull and upper cervical spine: Normal calvarium and skull base. Visualized upper cervical spine and soft tissues are normal. Unchanged occipital sebaceous cyst. Sinuses/Orbits:No paranasal sinus fluid levels or advanced mucosal thickening. No mastoid or middle ear effusion. Normal orbits. IMPRESSION: 1. Small focus of acute ischemia in the superior left hemisphere. No hemorrhage or mass effect. 2. Findings of chronic microvascular ischemia and generalized volume loss . 3. Assessment for metastatic disease limited without intravenous contrast. Electronically Signed   By: Deatra Robinson M.D.   On: 12/16/2020 01:01   MR THORACIC SPINE W WO CONTRAST  Result Date:  12/16/2020 CLINICAL DATA:  Renal cell carcinoma. Bilateral lower extremity weakness. EXAM: MRI THORACIC AND LUMBAR SPINE WITHOUT AND WITH CONTRAST TECHNIQUE: Multiplanar and multiecho pulse sequences of the thoracic and lumbar spine were obtained without and with intravenous contrast. CONTRAST:  11mL GADAVIST GADOBUTROL 1 MMOL/ML IV SOLN COMPARISON:  CT thoracic and lumbar spine 12/15/2020 FINDINGS: MRI THORACIC SPINE FINDINGS Alignment:  Physiologic. Vertebrae: There is signal change and mild contrast enhancement at the endplates of T5-6, T7-8 and T10-11. No discrete masslike lesion. Cord:  Normal Paraspinal and other soft tissues: Limited assessment of the lungs. Disc levels: No disc herniation or spinal canal stenosis. MRI LUMBAR SPINE FINDINGS Segmentation:  Standard. Alignment: Grade 1 retrolisthesis at L3-4 and grade 1 anterolisthesis at L4-5. Vertebrae: Multilevel degenerative endplate signal changes. No abnormal contrast enhancement. Conus medullaris: Extends to the L1 level and appears normal. 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 Disc levels: L1-L2: Normal disc space and facet joints. No spinal canal stenosis. No neural foraminal stenosis. L2-L3: Normal disc space and facet joints. No spinal canal stenosis. No neural foraminal stenosis. L3-L4: Disc space narrowing with right asymmetric disc bulge. No spinal canal stenosis. Moderate right and mild left neural foraminal stenosis. L4-L5: Moderate facet hypertrophy with small disc bulge. No spinal canal stenosis. No neural foraminal stenosis. L5-S1: Normal disc space and facet joints. No spinal canal stenosis. No neural foraminal stenosis. Visualized sacrum: Normal. IMPRESSION: 1. 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 2. No evidence of metastatic disease to the thoracic or lumbar spine. 3. Mild edema and contrast enhancement at the endplates at D34-534, X33443 and T10-11 without discrete masslike lesion. This is favored to be degenerative. 4. Moderate right L3-4 neural foraminal stenosis. Electronically Signed   By: Ulyses Jarred M.D.   On: 12/16/2020 01:10   MR Lumbar Spine W Wo Contrast  Result Date: 12/16/2020 CLINICAL DATA:  Renal cell carcinoma. Bilateral lower extremity weakness. EXAM: MRI THORACIC AND LUMBAR SPINE WITHOUT AND WITH CONTRAST TECHNIQUE: Multiplanar and multiecho pulse sequences of the thoracic and lumbar spine were obtained without and with intravenous contrast. CONTRAST:  73mL GADAVIST GADOBUTROL 1 MMOL/ML IV SOLN COMPARISON:  CT thoracic and lumbar spine 12/15/2020 FINDINGS: MRI THORACIC SPINE FINDINGS Alignment:  Physiologic. Vertebrae: There is signal change and mild contrast enhancement at the endplates of D34-534, X33443 and T10-11. No discrete masslike lesion. Cord:  Normal Paraspinal and other soft tissues: Limited assessment of the lungs. Disc levels: No disc herniation or spinal canal stenosis. MRI LUMBAR SPINE FINDINGS Segmentation:  Standard. Alignment: Grade 1 retrolisthesis at L3-4 and grade 1 anterolisthesis at L4-5. Vertebrae: Multilevel degenerative endplate signal changes. No abnormal contrast enhancement. Conus medullaris: Extends to the L1 level and appears normal. 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 Disc levels: L1-L2: Normal disc space and facet joints. No spinal canal stenosis. No neural foraminal stenosis. L2-L3: Normal disc space and facet joints. No spinal canal stenosis. No neural foraminal stenosis. L3-L4: Disc space narrowing with right asymmetric disc bulge. No spinal canal stenosis. Moderate right and mild left neural foraminal stenosis. L4-L5:  Moderate facet hypertrophy with small disc bulge. No spinal canal stenosis. No neural foraminal stenosis. L5-S1: Normal disc space and facet joints. No spinal canal stenosis. No neural foraminal stenosis. Visualized sacrum: Normal. IMPRESSION: 1. 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 2. No evidence of metastatic disease to the thoracic or lumbar spine. 3. Mild edema and contrast enhancement at the endplates at D34-534, X33443 and T10-11 without discrete masslike lesion. This is favored to be degenerative. 4. Moderate right L3-4 neural foraminal stenosis. Electronically Signed   By: Ulyses Jarred M.D.   On: 12/16/2020 01:10   CT CHEST ABDOMEN PELVIS W CONTRAST  Result Date: 12/15/2020 CLINICAL DATA:  Renal cell carcinoma abdominal mass. started Chemo 12/22 sent here by [facility] center for further evaluation EXAM: CT CHEST, ABDOMEN, AND PELVIS WITH CONTRAST TECHNIQUE: Multidetector CT imaging of the chest, abdomen and pelvis was performed following the standard protocol during bolus administration of intravenous contrast. CONTRAST:  137mL OMNIPAQUE IOHEXOL 300 MG/ML  SOLN COMPARISON:  MRI abdomen 07/20/2015, CT abdomen pelvis 07/06/2015. FINDINGS: CT CHEST FINDINGS Cardiovascular: Left atrial enlargement. The remainder of the heart is normal in caliber. For. No significant pericardial effusion. The thoracic aorta is normal in caliber. At least moderate atherosclerotic plaque of the thoracic aorta. At least moderate three-vessel coronary artery calcifications. The main pulmonary artery is normal in caliber. No central pulmonary embolus. Mediastinum/Nodes: No enlarged mediastinal, hilar, or axillary lymph nodes. Thyroid gland, trachea, and esophagus demonstrate no significant findings. Lungs/Pleura: Bilateral lower lobe subsegmental atelectasis. Lingular atelectasis. No focal consolidation. There is  a round solid 4 mm nodule within the left upper lobe. Micronodule within the right upper lobe (5:38).  Micronodule within the right lower lobe (5:80). Subpleural micronodule within the right upper lobe (5:64). No pulmonary mass. No pleural effusion. No pneumothorax. Musculoskeletal: Slightly asymmetric gynecomastia, right greater than left. No suspicious lytic or blastic osseous lesions. No acute displaced fracture. Multilevel degenerative changes of the spine. Please see separately dictated CT thoracolumbar spine 12/15/2020. CT ABDOMEN PELVIS FINDINGS Hepatobiliary: No focal liver abnormality. At least 2.8 cm peripherally calcified gallstone within the gallbladder lumen. No associated gallbladder wall thickening or pericholecystic fluid. No biliary dilatation. Pancreas: No focal lesion. Normal pancreatic contour. No surrounding inflammatory changes. No main pancreatic ductal dilatation. Spleen: Normal in size without focal abnormality. Adrenals/Urinary Tract: No adrenal nodule bilaterally. Status post left nephrectomy. The right kidney enhances homogeneous Lea. No nephrolithiasis. No hydronephrosis. No hydroureter. The urinary bladder is unremarkable. Status post Stomach/Bowel: Stomach is within normal limits. No evidence of small bowel wall thickening or dilatation. Diffuse sigmoid and descending colon diverticulosis this cysts with slightly increased trace pericolonic fat stranding but no definite bowel wall thickening. The appendix appears normal. Vascular/Lymphatic: No abdominal aorta or iliac aneurysm. Severe calcified and noncalcified atherosclerotic plaque of the aorta and its branches. Suggestion of prior retroperitoneal lymph node dissection. Heterogeneous lobulated 7.6 x 7.7 x 11.9 cm retroperitoneal mass that is noted to be inseparable from the inferior vena cava, third portion of the duodenum, aorta, and origin/proximal inferior mesenteric artery. No pelvic or inguinal lymphadenopathy. The mass is noted  to encase the inferior vena cava. Intraluminal extension of the mass not excluded. No findings suggest extension of the mass into the right renal vein or right cardiac atrium. Reproductive: Prostate is unremarkable. Other: Trace simple free fluid within the abdomen pelvis. No intraperitoneal free gas. No organized fluid collection. Musculoskeletal: No abdominal wall hernia or abnormality. No suspicious lytic or blastic osseous lesions. No acute displaced fracture. Multilevel degenerative changes of the spine. Please see separately dictated CT thoracolumbar spine 12/15/2020. IMPRESSION: 1. A 7.6 x 7.7 x 11.9 cm heterogeneous retroperitoneal mass is inseparable from the inferior vena cava, third portion of the duodenum, aorta, and origin/proximal inferior mesenteric artery. The mass is noted to encase the inferior vena cava with intraluminal extension not excluded. 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 2. Left atrial enlargement. 3. Diffuse descending colon and sigmoid diverticulosis with no CT findings of definite acute diverticulitis. 4. Cholelithiasis with no CT findings of acute cholecystitis or choledocholithiasis. 5. Slightly asymmetric gynecomastia, right greater than left. Recommend mammographic evaluation. 6.  Aortic Atherosclerosis (ICD10-I70.0). 7. Please see separately dictated CT thoracolumbar spine 12/15/2020. Electronically Signed   By: Iven Finn M.D.   On: 12/15/2020 21:37   CT T-SPINE NO CHARGE  Result Date: 12/15/2020 CLINICAL DATA:  Bilateral lower extremity weakness. EXAM: CT Thoracic and Lumbar spine without contrast TECHNIQUE: Multiplanar CT images of the thoracic and lumbar spine were reconstructed from contemporary CT of the Chest, Abdomen, and Pelvis CONTRAST:  No additional contrast administered COMPARISON:  None FINDINGS: CT THORACIC SPINE FINDINGS Alignment: Mild dextroscoliosis. Vertebrae: No acute  fracture or focal pathologic process. Paraspinal and other soft tissues: Please see report for concomitant CT of the chest, abdomen and pelvis. Disc levels: There is no spinal canal stenosis. CT LUMBAR SPINE FINDINGS Segmentation: Standard Alignment: Normal Vertebrae: No acute fracture or focal pathologic process. Paraspinal and other soft tissues: Retroperitoneal mass better characterized on concomitant CT of the chest, abdomen and pelvis. Disc levels: There is moderate L4-5 facet arthrosis. No lumbar  spinal canal stenosis or neural impingement. IMPRESSION: 1. No acute abnormality of the thoracic or lumbar spine. 2. Retroperitoneal mass better characterized on concomitant CT of the chest, abdomen and pelvis. Electronically Signed   By: Ulyses Jarred M.D.   On: 12/15/2020 21:20   CT L-SPINE NO CHARGE  Result Date: 12/15/2020 CLINICAL DATA:  Bilateral lower extremity weakness. EXAM: CT Thoracic and Lumbar spine without contrast TECHNIQUE: Multiplanar CT images of the thoracic and lumbar spine were reconstructed from contemporary CT of the Chest, Abdomen, and Pelvis CONTRAST:  No additional contrast administered COMPARISON:  None FINDINGS: CT THORACIC SPINE FINDINGS Alignment: Mild dextroscoliosis. Vertebrae: No acute fracture or focal pathologic process. Paraspinal and other soft tissues: Please see report for concomitant CT of the chest, abdomen and pelvis. Disc levels: There is no spinal canal stenosis. CT LUMBAR SPINE FINDINGS Segmentation: Standard Alignment: Normal Vertebrae: No acute fracture or focal pathologic process. Paraspinal and other soft tissues: Retroperitoneal mass better characterized on concomitant CT of the chest, abdomen and pelvis. Disc levels: There is moderate L4-5 facet arthrosis. No lumbar spinal canal stenosis or neural impingement. IMPRESSION: 1. No acute abnormality of the thoracic or lumbar spine. 2. Retroperitoneal mass better characterized on concomitant CT of the chest, abdomen  and pelvis. Electronically Signed   By: Ulyses Jarred M.D.   On: 12/15/2020 21:20   DG Chest Port 1V same Day  Result Date: 12/19/2020 CLINICAL DATA:  Chest pain EXAM: PORTABLE CHEST 1 VIEW COMPARISON:  07/13/2015 FINDINGS: Heart and mediastinal contours are within normal limits. No focal opacities or effusions. No acute bony abnormality. IMPRESSION: No active disease. Electronically Signed   By: Rolm Baptise M.D.   On: 12/19/2020 11:59   ECHOCARDIOGRAM COMPLETE BUBBLE STUDY  Result Date: 12/16/2020    ECHOCARDIOGRAM REPORT   Patient Name:   Evan Kirk Date of Exam: 12/16/2020 Medical Rec #:  [id number]         Height:       66.0 in Accession #:    [id number]        Weight:       225.0 lb Date of Birth:  19-Dec-1941         BSA:          2.102 m Patient Age:    64 years          BP:           116/65 mmHg Patient Gender: M                 HR:           73 bpm. Exam Location:  Inpatient Procedure: 2D Echo and Saline Contrast Bubble Study Indications:    Stroke  History:        Patient has prior history of Echocardiogram examinations, most                 recent 08/12/2015. CHF, Arrythmias:Atrial Fibrillation; Risk                 Factors:Hypertension.  Sonographer:    Mikki Santee RDCS (AE) Referring Phys: [place]  1. Left ventricular ejection fraction, by estimation, is 60 to 65%. The left ventricle has normal function. The left ventricle has no regional wall motion abnormalities. There is mild left ventricular hypertrophy. Left ventricular diastolic parameters are indeterminate.  2. Right ventricular systolic function is mildly reduced. The right ventricular size is mildly enlarged.  3. Left atrial size was severely  dilated.  4. The mitral valve is abnormal. Trivial mitral valve regurgitation. No evidence of mitral stenosis. Moderate mitral annular calcification.  5. The aortic valve is tricuspid. There is moderate calcification of the aortic valve. Aortic valve regurgitation  is not visualized. Mild aortic valve stenosis.  6. Aortic dilatation noted. There is dilatation of the ascending aorta, measuring 44 mm.  7. Agitated saline contrast bubble study was negative, with no evidence of any interatrial shunt. FINDINGS  Left Ventricle: Left ventricular ejection fraction, by estimation, is 60 to 65%. The left ventricle has normal function. The left ventricle has no regional wall motion abnormalities. The left ventricular internal cavity size was normal in size. There is  mild left ventricular hypertrophy. Left ventricular diastolic parameters are indeterminate. Right Ventricle: The right ventricular size is mildly enlarged. Right vetricular wall thickness was not well visualized. Right ventricular systolic function is mildly reduced. Left Atrium: Left atrial size was severely dilated. Right Atrium: Right atrial size was normal in size. Pericardium: There is no evidence of pericardial effusion. Mitral Valve: The mitral valve is abnormal. Moderate mitral annular calcification. Trivial mitral valve regurgitation. No evidence of mitral valve stenosis. Tricuspid Valve: The tricuspid valve is normal in structure. Tricuspid valve regurgitation is trivial. Aortic Valve: The aortic valve is tricuspid. There is moderate calcification of the aortic valve. Aortic valve regurgitation is not visualized. Mild aortic stenosis is present. Aortic valve mean gradient measures 8.2 mmHg. Aortic valve peak gradient measures 18.6 mmHg. Aortic valve area, by VTI measures 1.73 cm. Pulmonic Valve: The pulmonic valve was not well visualized. Pulmonic valve regurgitation is not visualized. Aorta: The aortic root is normal in size and structure and aortic dilatation noted. There is dilatation of the ascending aorta, measuring 44 mm. Venous: The inferior vena cava was not well visualized. IAS/Shunts: The interatrial septum was not well visualized. Agitated saline contrast was given intravenously to evaluate for  intracardiac shunting. Agitated saline contrast bubble study was negative, with no evidence of any interatrial shunt.  LEFT VENTRICLE PLAX 2D LVIDd:         3.80 cm  Diastology LVIDs:         2.70 cm  LV e' medial:   9.36 cm/s LV PW:         1.30 cm  LV E/e' medial: 0.9 LV IVS:        1.30 cm LVOT diam:     2.50 cm LV SV:         77 LV SV Index:   36 LVOT Area:     4.91 cm  RIGHT VENTRICLE RV S prime:     10.20 cm/s TAPSE (M-mode): 1.1 cm LEFT ATRIUM              Index       RIGHT ATRIUM           Index LA diam:        6.20 cm  2.95 cm/m  RA Area:     16.50 cm LA Vol (A2C):   114.0 ml 54.22 ml/m RA Volume:   37.60 ml  17.88 ml/m LA Vol (A4C):   126.0 ml 59.93 ml/m LA Biplane Vol: 120.0 ml 57.08 ml/m  AORTIC VALVE AV Area (Vmax):    1.62 cm AV Area (Vmean):   1.80 cm AV Area (VTI):     1.73 cm AV Vmax:           215.80 cm/s AV Vmean:  132.200 cm/s AV VTI:            0.443 m AV Peak Grad:      18.6 mmHg AV Mean Grad:      8.2 mmHg LVOT Vmax:         71.10 cm/s LVOT Vmean:        48.400 cm/s LVOT VTI:          0.156 m LVOT/AV VTI ratio: 0.35  AORTA Ao Root diam: 3.70 cm MV E velocity: 8.49 cm/s  TRICUSPID VALVE                           TR Peak grad:   21.2 mmHg                           TR Vmax:        230.00 cm/s                            SHUNTS                           Systemic VTI:  0.16 m                           Systemic Diam: 2.50 cm Oswaldo Milian MD Electronically signed by Oswaldo Milian MD Signature Date/Time: 12/16/2020/11:43:55 AM    Final     Microbiology: Recent Results (from the past 240 hour(s))  Resp Panel by RT-PCR (Flu A&B, Covid) Nasopharyngeal Swab     Status: None   Collection Time: 12/15/20 10:30 PM   Specimen: Nasopharyngeal Swab; Nasopharyngeal(NP) swabs in vial transport medium  Result Value Ref Range Status   SARS Coronavirus 2 by RT PCR NEGATIVE NEGATIVE Final    Comment: (NOTE) SARS-CoV-2 target nucleic acids are NOT DETECTED.  The SARS-CoV-2  RNA is generally detectable in upper respiratory specimens during the acute phase of infection. The lowest concentration of SARS-CoV-2 viral copies this assay can detect is 138 copies/mL. A negative result does not preclude SARS-Cov-2 infection and should not be used as the sole basis for treatment or other patient management decisions. A negative result may occur with  improper specimen collection/handling, submission of specimen other than nasopharyngeal swab, presence of viral mutation(s) within the areas targeted by this assay, and inadequate number of viral copies(<138 copies/mL). A negative result must be combined with clinical observations, patient history, and epidemiological information. The expected result is Negative.  Fact Sheet for Patients:  EntrepreneurPulse.com.au  Fact Sheet for Healthcare Providers:  IncredibleEmployment.be  This test is no t yet approved or cleared by the [place] FDA and  has been authorized for detection and/or diagnosis of SARS-CoV-2 by FDA under an Emergency Use Authorization (EUA). This EUA will remain  in effect (meaning this test can be used) for the duration of the COVID-19 declaration under Section 564(b)(1) of the Act, 21 U.S.C.section 360bbb-3(b)(1), unless the authorization is terminated  or revoked sooner.       Influenza A by PCR NEGATIVE NEGATIVE Final   Influenza B by PCR NEGATIVE NEGATIVE Final    Comment: (NOTE) The Xpert Xpress SARS-CoV-2/FLU/RSV plus assay is intended as an aid in the diagnosis of influenza from Nasopharyngeal swab specimens and should not be used as a sole basis for treatment. Nasal washings  and aspirates are unacceptable for Xpert Xpress SARS-CoV-2/FLU/RSV testing.  Fact Sheet for Patients: EntrepreneurPulse.com.au  Fact Sheet for Healthcare Providers: IncredibleEmployment.be  This test is not yet approved or cleared by the  [place] FDA and has been authorized for detection and/or diagnosis of SARS-CoV-2 by FDA under an Emergency Use Authorization (EUA). This EUA will remain in effect (meaning this test can be used) for the duration of the COVID-19 declaration under Section 564(b)(1) of the Act, 21 U.S.C. section 360bbb-3(b)(1), unless the authorization is terminated or revoked.  Performed at [facility], [place] [address], [place], [place] [address]      Labs: Basic Metabolic Panel: Recent Labs  Lab 12/17/20 0314 12/20/20 0343 12/21/20 0139  NA 134* 132* 134*  K 4.1 4.5 4.6  CL 103 98 100  CO2 23 24 27   GLUCOSE 113* 120* 111*  BUN 16 20 19   CREATININE 1.21 1.21 1.14  CALCIUM 10.0 10.7* 10.5*  MG 2.0  --   --    Liver Function Tests: Recent Labs  Lab 12/17/20 0314  AST 12*  ALT 10  ALKPHOS 54  BILITOT 0.5  PROT 7.1  ALBUMIN 3.0*   No results for input(s): LIPASE, AMYLASE in the last 168 hours. No results for input(s): AMMONIA in the last 168 hours. CBC: Recent Labs  Lab 12/17/20 0314 12/19/20 1418 12/20/20 0343  WBC 8.1  --  9.3  HGB 12.5* 12.5* 12.4*  HCT 38.1* 39.5 37.5*  MCV 89.9  --  88.9  PLT 395  --  422*   Cardiac Enzymes: No results for input(s): CKTOTAL, CKMB, CKMBINDEX, TROPONINI in the last 168 hours. BNP: BNP (last 3 results) No results for input(s): BNP in the last 8760 hours.  ProBNP (last 3 results) No results for input(s): PROBNP in the last 8760 hours.  CBG: No results for input(s): GLUCAP in the last 168 hours.  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  Signed:        Khalon Cansler, DO [facility]  12/23/2020, 11:50 AM

## 2020-12-23 NOTE — Progress Notes (Signed)
RT note. CPAP machine set up for patient tonight. Pt. Will place CPAP machine on when ready for bed. RT will continue to monitor.

## 2020-12-23 NOTE — Progress Notes (Signed)
Inpatient Rehab Admissions Coordinator:    I have insurance approval and a bed available for pt to admit to CIR today. Dr. Gerri Lins in agreement.  Will let pt/family and TOC team know.   Estill Dooms, PT, DPT Admissions Coordinator [phone] 12/23/20  10:12 AM

## 2020-12-24 ENCOUNTER — Inpatient Hospital Stay (HOSPITAL_COMMUNITY): Payer: No Typology Code available for payment source | Admitting: Speech Pathology

## 2020-12-24 ENCOUNTER — Inpatient Hospital Stay (HOSPITAL_COMMUNITY): Payer: No Typology Code available for payment source | Admitting: Occupational Therapy

## 2020-12-24 ENCOUNTER — Inpatient Hospital Stay (HOSPITAL_COMMUNITY): Payer: No Typology Code available for payment source

## 2020-12-24 DIAGNOSIS — G6182 Multifocal motor neuropathy: Secondary | ICD-10-CM | POA: Diagnosis not present

## 2020-12-24 LAB — CBC WITH DIFFERENTIAL/PLATELET
Abs Immature Granulocytes: 0.06 10*3/uL (ref 0.00–0.07)
Basophils Absolute: 0.1 10*3/uL (ref 0.0–0.1)
Basophils Relative: 1 %
Eosinophils Absolute: 0.4 10*3/uL (ref 0.0–0.5)
Eosinophils Relative: 5 %
HCT: 40 % (ref 39.0–52.0)
Hemoglobin: 12.7 g/dL — ABNORMAL LOW (ref 13.0–17.0)
Immature Granulocytes: 1 %
Lymphocytes Relative: 14 %
Lymphs Abs: 1.4 10*3/uL (ref 0.7–4.0)
MCH: 28.5 pg (ref 26.0–34.0)
MCHC: 31.8 g/dL (ref 30.0–36.0)
MCV: 89.7 fL (ref 80.0–100.0)
Monocytes Absolute: 1.2 10*3/uL — ABNORMAL HIGH (ref 0.1–1.0)
Monocytes Relative: 12 %
Neutro Abs: 6.5 10*3/uL (ref 1.7–7.7)
Neutrophils Relative %: 67 %
Platelets: 401 10*3/uL — ABNORMAL HIGH (ref 150–400)
RBC: 4.46 MIL/uL (ref 4.22–5.81)
RDW: 13.9 % (ref 11.5–15.5)
WBC: 9.6 10*3/uL (ref 4.0–10.5)
nRBC: 0 % (ref 0.0–0.2)

## 2020-12-24 LAB — COMPREHENSIVE METABOLIC PANEL
ALT: 18 U/L (ref 0–44)
AST: 18 U/L (ref 15–41)
Albumin: 3.1 g/dL — ABNORMAL LOW (ref 3.5–5.0)
Alkaline Phosphatase: 60 U/L (ref 38–126)
Anion gap: 11 (ref 5–15)
BUN: 19 mg/dL (ref 8–23)
CO2: 25 mmol/L (ref 22–32)
Calcium: 10.3 mg/dL (ref 8.9–10.3)
Chloride: 102 mmol/L (ref 98–111)
Creatinine, Ser: 1.11 mg/dL (ref 0.61–1.24)
GFR, Estimated: >60 mL/min (ref >60)
Glucose, Bld: 106 mg/dL — ABNORMAL HIGH (ref 70–99)
Potassium: 4.1 mmol/L (ref 3.5–5.1)
Sodium: 138 mmol/L (ref 135–145)
Total Bilirubin: 0.7 mg/dL (ref 0.3–1.2)
Total Protein: 7 g/dL (ref 6.5–8.1)

## 2020-12-24 MED ORDER — TRAZODONE HCL 50 MG PO TABS
50.0000 mg | ORAL_TABLET | Freq: Every day | ORAL | Status: DC
  Administered 2020-12-24 – 2020-12-29 (×6): 50 mg via ORAL
  Filled 2020-12-24 (×8): qty 1

## 2020-12-24 MED ORDER — SORBITOL 70 % SOLN
45.0000 mL | Freq: Once | Status: AC
  Administered 2020-12-24: 45 mL via ORAL
  Filled 2020-12-24: qty 60

## 2020-12-24 MED ORDER — METRONIDAZOLE 0.75 % EX GEL: Freq: Every day | CUTANEOUS | Status: DC | PRN

## 2020-12-24 MED ORDER — SENNA 8.6 MG PO TABS
2.0000 | ORAL_TABLET | Freq: Every day | ORAL | Status: DC
  Administered 2020-12-24 – 2020-12-31 (×6): 17.2 mg via ORAL
  Filled 2020-12-24 (×8): qty 2

## 2020-12-24 MED ORDER — POLYVINYL ALCOHOL 1.4 % OP SOLN
1.0000 [drp] | Freq: Every day | OPHTHALMIC | Status: DC
  Administered 2020-12-24 – 2021-01-01 (×42): 1 [drp] via OPHTHALMIC

## 2020-12-24 NOTE — Plan of Care (Signed)
  Problem: RH Balance Goal: LTG Patient will maintain dynamic sitting balance (PT) Description: LTG:  Patient will maintain dynamic sitting balance with assistance during mobility activities (PT) Flowsheets (Taken 12/24/2020 1250) LTG: Pt will maintain dynamic sitting balance during mobility activities with:: Independent Goal: LTG Patient will maintain dynamic standing balance (PT) Description: LTG:  Patient will maintain dynamic standing balance with assistance during mobility activities (PT) Flowsheets (Taken 12/24/2020 1250) LTG: Pt will maintain dynamic standing balance during mobility activities with:: Supervision/Verbal cueing   Problem: Sit to Stand Goal: LTG:  Patient will perform sit to stand with assistance level (PT) Description: LTG:  Patient will perform sit to stand with assistance level (PT) Flowsheets (Taken 12/24/2020 1250) LTG: PT will perform sit to stand in preparation for functional mobility with assistance level: Supervision/Verbal cueing   Problem: RH Bed Mobility Goal: LTG Patient will perform bed mobility with assist (PT) Description: LTG: Patient will perform bed mobility with assistance, with/without cues (PT). Flowsheets (Taken 12/24/2020 1250) LTG: Pt will perform bed mobility with assistance level of: Supervision/Verbal cueing   Problem: RH Bed to Chair Transfers Goal: LTG Patient will perform bed/chair transfers w/assist (PT) Description: LTG: Patient will perform bed to chair transfers with assistance (PT). Flowsheets (Taken 12/24/2020 1250) LTG: Pt will perform Bed to Chair Transfers with assistance level: Supervision/Verbal cueing   Problem: RH Car Transfers Goal: LTG Patient will perform car transfers with assist (PT) Description: LTG: Patient will perform car transfers with assistance (PT). Flowsheets (Taken 12/24/2020 1250) LTG: Pt will perform car transfers with assist:: Supervision/Verbal cueing   Problem: RH Ambulation Goal: LTG Patient will ambulate in  controlled environment (PT) Description: LTG: Patient will ambulate in a controlled environment, # of feet with assistance (PT). Flowsheets (Taken 12/24/2020 1251) LTG: Pt will ambulate in controlled environ  assist needed:: Supervision/Verbal cueing LTG: Ambulation distance in controlled environment: 50 Goal: LTG Patient will ambulate in home environment (PT) Description: LTG: Patient will ambulate in home environment, # of feet with assistance (PT). Flowsheets (Taken 12/24/2020 1252) LTG: Pt will ambulate in home environ  assist needed:: Supervision/Verbal cueing LTG: Ambulation distance in home environment: 50   Problem: RH Wheelchair Mobility Goal: LTG Patient will propel w/c in controlled environment (PT) Description: LTG: Patient will propel wheelchair in controlled environment, # of feet with assist (PT) Flowsheets (Taken 12/24/2020 1252) LTG: Pt will propel w/c in controlled environ  assist needed:: Independent with assistive device LTG: Propel w/c distance in controlled environment: 150 Goal: LTG Patient will propel w/c in home environment (PT) Description: LTG: Patient will propel wheelchair in home environment, # of feet with assistance (PT). Flowsheets (Taken 12/24/2020 1252) LTG: Pt will propel w/c in home environ  assist needed:: Independent with assistive device Distance: wheelchair distance in controlled environment: 150   Problem: RH Stairs Goal: LTG Patient will ambulate up and down stairs w/assist (PT) Description: LTG: Patient will ambulate up and down # of stairs with assistance (PT) Flowsheets (Taken 12/24/2020 1252) LTG: Pt will ambulate up/down stairs assist needed:: Contact Guard/Touching assist LTG: Pt will  ambulate up and down number of stairs: 1

## 2020-12-24 NOTE — Evaluation (Signed)
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 Anticoagulated on Coumadin    followed by [facility]  . Atrial fibrillation ([place])   . Back pain   . Bladder mass   . BPH (benign prostatic hypertrophy)   . Chronic atrial fibrillation ([place])   . Diastolic CHF, chronic [facility])    cardiologist-- dr Daneen Schick-- asymptomatic  . Diverticulosis of colon   . Edema   . Essential tremor 06-2015  . History of urinary retention   . HTN (hypertension)   . OSA on CPAP   . Peripheral neuropathy    BILATERAL FEET  . Prediabetes   . PTSD (post-traumatic stress disorder)   . Renal cell carcinoma of left kidney [facility])    Past Surgical History:  Past Surgical History:  Procedure Laterality Date  . CYSTOSCOPY WITH BIOPSY N/A 10/09/2015   Procedure: CYSTOSCOPY WITH BIOPSY;  Surgeon: Cleon Gustin, MD;  Location: [facility];  Service: Urology;  Laterality: N/A;  . NEPHRECTOMY Left   . NEPHRECTOMY RADICAL Left 08/13/2015   [facility]-  . TOTAL KNEE ARTHROPLASTY Right 11-10-2008  . TRANSTHORACIC ECHOCARDIOGRAM  08-12-2015      moderate concentric LVH, ef 50-55-%/  trivial MR , PR and TR/  severe LAE and RAE  . VASECTOMY REVERSAL  1978    Assessment & Plan Clinical Impression: Patient is a 80 y.o. year old male with history of atrial fibrillation maintained on Eliquis, hypertension, OSA, renal cancer with left  nephrectomy 2016 with local recurrence of tumor surrounding IVC/aorta undergoing immunotherapy receiving chemo via oncology [facility] as well as [facility] [phone] as well as Dr. Candiss Norse, BPH, diastolic congestive heart failure, essential tremor, prediabetes. History taken from chart review and patient. Patient lives with spouse.  1 level home ramped entrance.  Independent with assistive device.  Wife unable physically to assist. He presented on 12/15/2020 with bilateral lower extremity weakness, right greater than left of acute onset.  CT/MRI showed small focus of acute ischemia in the superior left hemisphere. No hemorrhage or mass-effect.  CT angiogram of head and neck no emergent large vessel occlusion or stenosis.  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  No evidence of metastatic disease to the thoracic or lumbar spine.  Mild edema and contrast enhancement at the endplates of F1-63-8 G66-59 without discrete masslike lesion. Echocardiogram with ejection fraction 60-65%, no wall motion abnormalities.  Admission chemistries unremarkable except sodium 134, glucose 100, hemoglobin 12.7.  Neurology follow-up maintained on intravenous heparin transitioned to Eliquis.  Physical medicine rehab consult requested to assess candidacy for CIR given lower extremity weakness. Patient was admitted for a comprehensive rehab program. Please see preadmission assessment from earlier today as well.  Patient currently requires mod with mobility secondary to muscle weakness, decreased cardiorespiratoy endurance, impaired timing and sequencing, abnormal tone, unbalanced  muscle activation and decreased coordination, decreased motor planning and decreased sitting balance, decreased standing balance, decreased postural control and decreased balance strategies.   Prior to hospitalization, patient was independent  with mobility and lived with Spouse in a House home.  Home access is  Ramped entrance.  Patient will benefit from skilled PT intervention to maximize safe functional mobility, minimize fall risk and decrease caregiver burden for planned discharge home with 24 hour supervision.  Anticipate patient will benefit from follow up [place] at discharge.  PT - End of Session Activity Tolerance: Tolerates 30+ min activity with multiple rests Endurance Deficit: Yes PT Assessment Rehab Potential (ACUTE/IP ONLY): Good PT Barriers to Discharge: Decreased caregiver support PT Barriers to Discharge Comments: decreased physical assist avilable at home PT Patient demonstrates impairments in the following area(s): Balance;Endurance;Motor;Nutrition;Pain;Perception;Safety;Sensory PT Transfers Functional Problem(s): Bed Mobility;Bed to Chair;Car;Furniture PT Locomotion Functional Problem(s): Ambulation;Wheelchair Mobility;Stairs PT Plan PT Intensity: Minimum of 1-2 x/day ,45 to 90 minutes PT Frequency: 5 out of 7 days PT Duration Estimated Length of Stay: 12-14 days PT Treatment/Interventions: Ambulation/gait training;Cognitive remediation/compensation;Discharge planning;DME/adaptive equipment instruction;Functional mobility training;Pain management;Psychosocial support;Splinting/orthotics;Therapeutic Activities;UE/LE Strength taining/ROM;Visual/perceptual remediation/compensation;Balance/vestibular training;Community reintegration;Disease management/prevention;Functional electrical stimulation;Neuromuscular re-education;Patient/family education;Skin care/wound management;Stair training;Therapeutic Exercise;UE/LE Coordination activities;Wheelchair propulsion/positioning PT Transfers Anticipated Outcome(s): Supervision with AD PT Locomotion Anticipated Outcome(s): Supervision household distances with AD PT Recommendation Recommendations for Other Services: Neuropsych  consult Follow Up Recommendations: Home health PT;Outpatient PT;24 hour supervision/assistance Patient destination: Home Equipment Details: TBD   PT Evaluation Precautions/Restrictions Precautions Precautions: Fall;Other (comment) Precaution Comments: bradycardia Home Living/Prior Functioning Home Living Available Help at Discharge: Family Type of Home: House Home Access: Ramped entrance Home Layout: One level Bathroom Shower/Tub: Chiropodist: Handicapped height Bathroom Accessibility: Yes Additional Comments: Wife unable to physically assist  Lives With: Spouse Prior Function Level of Independence: Requires assistive device for independence;Independent with basic ADLs;Independent with homemaking with ambulation;Independent with gait;Independent with transfers Vision/Perception  Vision - Assessment Eye Alignment: Within Functional Limits Ocular Range of Motion: Within Functional Limits Alignment/Gaze Preference: Within Defined Limits Tracking/Visual Pursuits: Able to track stimulus in all quads without difficulty Perception Perception: Within Functional Limits Praxis Praxis: Intact  Cognition Overall Cognitive Status: Within Functional Limits for tasks assessed Arousal/Alertness: Awake/alert Orientation Level: Oriented X4 Memory: Impaired Awareness: Appears intact Problem Solving: Appears intact Safety/Judgment: Appears intact Sensation Sensation Light Touch: Impaired by gross assessment Peripheral sensation comments: h/o neuropathy, reports of n/t distal B LE, R >L Hot/Cold: Not tested Proprioception: Appears Intact Stereognosis: Appears Intact Coordination Gross Motor Movements are Fluid and Coordinated: No Fine Motor Movements are Fluid and Coordinated: Yes Coordination and Movement Description: generalized LE weakness Heel Shin Test: limited ROM, R >L Motor  Motor Motor: Abnormal tone;Abnormal postural alignment and control Motor -  Skilled Clinical Observations: generalized weakness B LE   Trunk/Postural Assessment  Cervical Assessment Cervical Assessment: Within Functional Limits Thoracic Assessment Thoracic Assessment: Exceptions to [facility] (kyphosis) Lumbar Assessment Lumbar Assessment: Exceptions to [facility] (posterior pelvic tilt) Postural Control Postural Control: Deficits on evaluation Righting Reactions: delayed and inadequate Protective Responses: delayed and inadequate  Balance Balance Balance Assessed: Yes Standardized Balance Assessment Standardized Balance Assessment: Timed Up and Go Test Timed Up and Go Test TUG: Normal TUG Normal TUG (seconds): 187.9 Static Sitting Balance Static Sitting - Level of Assistance: 5: Stand by assistance Dynamic Sitting Balance Dynamic Sitting - Level of Assistance: 5: Stand by assistance Sitting balance - Comments: No UE support, supervision for safety. Static Standing Balance Static Standing - Level of Assistance: 4:  Min assist Dynamic Standing Balance Dynamic Standing - Level of Assistance: 3: Mod assist Extremity Assessment      RLE Assessment RLE Assessment: Exceptions to [facility] RLE Strength Right Hip Flexion: 3/5 Right Hip ABduction: 3+/5 Right Hip ADduction: 3+/5 Right Knee Flexion: 3+/5 Right Knee Extension: 3+/5 Right Ankle Dorsiflexion: 4-/5 Right Ankle Plantar Flexion: 4-/5 RLE Tone RLE Tone Comments: 2-beat clonus LLE Assessment LLE Assessment: Exceptions to [facility] LLE Strength Left Hip Flexion: 3+/5 Left Hip ABduction: 3+/5 Left Hip ADduction: 3+/5 Left Knee Flexion: 3+/5 Left Knee Extension: 3+/5 Left Ankle Dorsiflexion: 4-/5 Left Ankle Plantar Flexion: 4-/5 LLE Tone LLE Tone: Other (comment) (2-beat clonus)  Care Tool Care Tool Bed Mobility Roll left and right activity   Roll left and right assist level: Supervision/Verbal cueing    Sit to lying activity   Sit to lying assist level: Moderate Assistance - Patient 50 - 74%    Lying to  sitting edge of bed activity   Lying to sitting edge of bed assist level: Moderate Assistance - Patient 50 - 74%     Care Tool Transfers Sit to stand transfer   Sit to stand assist level: Moderate Assistance - Patient 50 - 74%    Chair/bed transfer   Chair/bed transfer assist level: Moderate Assistance - Patient 50 - 74%     Toilet transfer   Assist Level: Moderate Assistance - Patient 50 - 74%    Car transfer Car transfer activity did not occur: Safety/medical concerns        Care Tool Locomotion Ambulation   Assist level: Moderate Assistance - Patient 50 - 74% Assistive device: Walker-rolling Max distance: 20  Walk 10 feet activity   Assist level: Moderate Assistance - Patient - 50 - 74% Assistive device: Walker-rolling   Walk 50 feet with 2 turns activity Walk 50 feet with 2 turns activity did not occur: Safety/medical concerns      Walk 150 feet activity Walk 150 feet activity did not occur: Safety/medical concerns      Walk 10 feet on uneven surfaces activity Walk 10 feet on uneven surfaces activity did not occur: Safety/medical concerns      Stairs Stair activity did not occur: Safety/medical concerns        Walk up/down 1 step activity Walk up/down 1 step or curb (drop down) activity did not occur: Safety/medical concerns     Walk up/down 4 steps activity did not occuR: Safety/medical concerns  Walk up/down 4 steps activity      Walk up/down 12 steps activity Walk up/down 12 steps activity did not occur: Safety/medical concerns      Pick up small objects from floor Pick up small object from the floor (from standing position) activity did not occur: Safety/medical concerns      Wheelchair Will patient use wheelchair at discharge?: Yes Type of Wheelchair: Manual   Wheelchair assist level: Supervision/Verbal cueing Max wheelchair distance: 150  Wheel 50 feet with 2 turns activity   Assist Level: Supervision/Verbal cueing  Wheel 150 feet activity    Assist Level: Supervision/Verbal cueing    Refer to Care Plan for Long Term Goals  SHORT TERM GOAL WEEK 1 PT Short Term Goal 1 (Week 1): Patient will complete supine <> sit with MinA PT Short Term Goal 2 (Week 1): Patient will be able to complete basic transfers with CGA and LRAD PT Short Term Goal 3 (Week 1): Patient will ambulate >46f with MinA and LRAD  Recommendations for other services: Neuropsych  Skilled Therapeutic Intervention Mobility Bed Mobility Bed Mobility: Rolling Right;Rolling Left;Supine to Sit Rolling Right: Supervision/verbal cueing Rolling Left: Supervision/Verbal cueing Supine to Sit: Moderate Assistance - Patient 50-74% Transfers Transfers: Sit to Stand;Stand to Sit;Stand Pivot Transfers Sit to Stand: Moderate Assistance - Patient 50-74% Stand to Sit: Moderate Assistance - Patient 50-74% Stand Pivot Transfers: Moderate Assistance - Patient 50 - 74% Stand Pivot Transfer Details: Manual facilitation for weight shifting;Verbal cues for precautions/safety;Verbal cues for sequencing Transfer (Assistive device): 1 person hand held assist Locomotion  Gait Ambulation: Yes Gait Assistance: Moderate Assistance - Patient 50-74% Gait Distance (Feet): 20 Feet Assistive device: Rolling walker Gait Assistance Details: Manual facilitation for weight shifting;Manual facilitation for placement;Verbal cues for safe use of DME/AE;Verbal cues for precautions/safety;Verbal cues for gait pattern Gait Gait: Yes Gait Pattern: Impaired Gait Pattern: Decreased dorsiflexion - left;Decreased dorsiflexion - right;Step-through pattern;Decreased step length - left;Decreased step length - right;Decreased hip/knee flexion - left;Decreased hip/knee flexion - right;Left foot flat;Right foot flat;Left circumduction;Right circumduction;Trunk flexed;Narrow base of support Gait velocity: reduced Wheelchair Mobility Wheelchair Mobility: Yes Wheelchair Assistance: Dentist: Both upper extremities Wheelchair Parts Management: Supervision/cueing Distance: 150  Patient received supine in bed, agreeable to PT. He reports 5/10 generalized pain, but reports that it's premorbid "aches and pains." He appears very teary eyed and overwhelmed by being in the hospital with his wife at home. He requires grossly ModA for all functional mobility. Within functional limits AROM, but decreased ROM demonstrated throughout gait tx. Patient with generalized weakness to B LE, R>L. He reports good social support from 5 sons, who live locally, but none available 24/7 and wife unable to provide physical assist. At end of session, patient up in recliner, seatbelt alarm on, call light within reach.   Discharge Criteria: Patient will be discharged from PT if patient refuses treatment 3 consecutive times without medical reason, if treatment goals not met, if there is a change in medical status, if patient makes no progress towards goals or if patient is discharged from hospital.  The above assessment, treatment plan, treatment alternatives and goals were discussed and mutually agreed upon: by patient  Debbora Dus 12/24/2020, 7:44 AM

## 2020-12-24 NOTE — Progress Notes (Signed)
[place] PHYSICAL MEDICINE & REHABILITATION PROGRESS NOTE   Subjective/Complaints:  Pt reports had a bad night sleeping as well as had bladder accident "all over himself". Also upset that got "bad' blood draw this AM.   The pain he's having is "chronic/normal for him.  LBM 4 days ago- can't eat due to constipation- not hungry-      ROS:  Pt denies SOB, abd pain, CP, N/V/C/D, and vision changes  Objective:   No results found. Recent Labs    12/24/20 0502  WBC 9.6  HGB 12.7*  HCT 40.0  PLT 401*   Recent Labs    12/24/20 0502  NA 138  K 4.1  CL 102  CO2 25  GLUCOSE 106*  BUN 19  CREATININE 1.11  CALCIUM 10.3    Intake/Output Summary (Last 24 hours) at 12/24/2020 1040 Last data filed at 12/24/2020 0750 Gross per 24 hour  Intake 240 ml  Output 1125 ml  Net -885 ml     Pressure Injury 12/23/20 Coccyx Medial Stage 1 -  Intact skin with non-blanchable redness of a localized area usually over a bony prominence. (Active)  12/23/20 1603  Location: Coccyx  Location Orientation: Medial  Staging: Stage 1 -  Intact skin with non-blanchable redness of a localized area usually over a bony prominence.  Wound Description (Comments):   Present on Admission: Yes    Physical Exam: Vital Signs Blood pressure 123/66, pulse (!) 49, temperature 97.7 F (36.5 C), temperature source Oral, resp. rate 16, height 5\' 6"  (1.676 m), weight 99.9 kg, SpO2 97 %.   Constitutional:      General: asleep initially- took a few minutes ot wake up- wearing CPAP, NAD HENT: conjugate gaze- marks on nose/face from CPAP- full mask  Cardiovascular: irregularly irregular- bradycardic Pulmonary: CTA B/L- no W/R/R- good air movement Abdominal: soft, NT, slightly distended?, hypoactive BS Musculoskeletal:     Cervical back: Normal range of motion and neck supple.     Comments: No edema or tenderness in extremities  Skin:    General: Skin is warm and dry.  Neurological: Ox3- but sleepy Follows  commands Motor: Bilateral upper extremities: 5/5 proximal distal Right lower extremity: Hip flexion, knee extension 2/5, dorsiflexion 4/5 Left lower extremity: Hip flexion, knee extension 2+/5 plantarflexion 4/5 Left facial weakness  Psychiatric:   appropriate     Assessment/Plan: 1. Functional deficits which require 3+ hours per day of interdisciplinary therapy in a comprehensive inpatient rehab setting.  Physiatrist is providing close team supervision and 24 hour management of active medical problems listed below.  Physiatrist and rehab team continue to assess barriers to discharge/monitor patient progress toward functional and medical goals  Care Tool:  Bathing              Bathing assist       Upper Body Dressing/Undressing Upper body dressing        Upper body assist      Lower Body Dressing/Undressing Lower body dressing            Lower body assist       Toileting Toileting    Toileting assist Assist for toileting: Minimal Assistance - Patient > 75%     Transfers Chair/bed transfer  Transfers assist           Locomotion Ambulation   Ambulation assist      Assist level: Minimal Assistance - Patient > 75% Assistive device: Walker-rolling     Walk 10 feet activity  Assist           Walk 50 feet activity   Assist           Walk 150 feet activity   Assist           Walk 10 feet on uneven surface  activity   Assist           Wheelchair     Assist               Wheelchair 50 feet with 2 turns activity    Assist            Wheelchair 150 feet activity     Assist          Blood pressure 123/66, pulse (!) 49, temperature 97.7 F (36.5 C), temperature source Oral, resp. rate 16, height 5\' 6"  (1.676 m), weight 99.9 kg, SpO2 97 %.  Medical Problem List and Plan: 1. 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             -patient may shower              -ELOS/Goals: 14-17 days/Supervision/min a             Admit to CIR 2.  Antithrombotics: -DVT/anticoagulation: Eliquis             -antiplatelet therapy: N/A 3. Pain Management: Lyrica 25 mg twice daily  1/6- pain is chronic, but overall controlled- con't regimen 4. Mood: Provide emotional support.               -antipsychotic agents: N/A 5. Neuropsych: This patient is capable of making decisions on his own behalf. 6. Skin/Wound Care: Routine skin checks  1/6- Possible pressure ulcer- will check skin 7. Fluids/Electrolytes/Nutrition: Routine in and outs             CMP ordered for tomorrow. 8.  Atrial fibrillation.  Continue Eliquis.  Cardiac rate controlled             Monitor with increased activity  1/6- rate bradycardic this AM- will monitor closely- might need to titrate meds 9.  OSA.  CPAP 10.  Active Recurrent renal carcinoma.  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 Hyperlipidemia: Lipitor 12. 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 Hyponatremia             Sodium 134 on 1/3, CMP ordered for tomorrow   15. Prediabetes?  1/6- BG 106 this AM, but A1c is 5.5- will monitor for stress rxn/hyperglycemia.     LOS: 1 days A FACE TO FACE EVALUATION WAS PERFORMED  Samhitha Rosen 12/24/2020, 10:40 AM

## 2020-12-24 NOTE — Evaluation (Signed)
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 Anticoagulated on Coumadin    followed by [place] [facility]  . Atrial fibrillation ([place])   . Back pain   . Bladder mass   . BPH (benign prostatic hypertrophy)   . Chronic atrial fibrillation ([place])   . Diastolic CHF, chronic [facility])    cardiologist-- dr Daneen Schick-- asymptomatic  . Diverticulosis of colon   . Edema   . Essential tremor 06-2015  . History of urinary retention   . HTN (hypertension)   . OSA on CPAP   . Peripheral neuropathy    BILATERAL FEET  . Prediabetes   . PTSD (post-traumatic stress disorder)   . Renal cell carcinoma of left kidney [facility])    Past Surgical History:  Past Surgical History:  Procedure Laterality Date  . CYSTOSCOPY WITH BIOPSY N/A 10/09/2015   Procedure: CYSTOSCOPY WITH BIOPSY;  Surgeon: Cleon Gustin, MD;  Location: [facility];  Service: Urology;  Laterality: N/A;  . NEPHRECTOMY Left   . NEPHRECTOMY RADICAL Left 08/13/2015   [facility]-  . TOTAL KNEE ARTHROPLASTY Right 11-10-2008  . TRANSTHORACIC ECHOCARDIOGRAM  08-12-2015      moderate concentric LVH, ef 50-55-%/  trivial MR , PR and TR/  severe LAE and RAE  . VASECTOMY REVERSAL  1978    Assessment & Plan Clinical Impression: Patient is an 80 y.o. year old male with history of atrial fibrillation maintained on Eliquis, hypertension, OSA, renal cancer with left nephrectomy 2016 with local  recurrence of tumor surrounding IVC/aorta undergoing immunotherapy receiving chemo via oncology [facility] as well as [facility] [phone] as well as Dr. Candiss Norse, BPH, diastolic congestive heart failure, essential tremor, prediabetes. History taken from chart review and patient. Patient lives with spouse.  1 level home ramped entrance.  Independent with assistive device.  Wife unable physically to assist. He presented on 12/15/2020 with bilateral lower extremity weakness, right greater than left of acute onset.  CT/MRI showed small focus of acute ischemia in the superior left hemisphere. No hemorrhage or mass-effect.  CT angiogram of head and neck no emergent large vessel occlusion or stenosis.  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  No evidence of metastatic disease to the thoracic or lumbar spine.  Mild edema and contrast enhancement at the endplates of W2-95-6 O13-08 without discrete masslike lesion. Echocardiogram with ejection fraction 60-65%, no wall motion abnormalities.  Admission chemistries unremarkable except sodium 134, glucose 100, hemoglobin 12.7.  Neurology follow-up maintained on intravenous heparin transitioned to Eliquis.   Patient transferred to CIR on 12/23/2020 .    Patient currently requires mod with basic self-care skills and IADL secondary to muscle weakness, decreased cardiorespiratoy endurance, unbalanced muscle activation and decreased coordination and decreased standing balance and decreased postural control.  Prior to hospitalization, patient could complete ADL/mobility with modified independent .  Patient will benefit from skilled intervention to decrease level of assist with basic self-care skills, increase independence with basic self-care skills and increase level of independence with iADL prior to discharge  home with care partner.  Anticipate patient will require intermittent supervision and follow up home health.  OT - End of Session Activity Tolerance: Tolerates 30+ min activity with multiple rests Endurance Deficit: Yes Endurance Deficit Description: fatigue with adl tasks OT Assessment OT Patient demonstrates impairments in the following area(s): Balance;Endurance;Motor;Sensory OT Basic ADL's Functional Problem(s): Grooming;Bathing;Dressing;Toileting OT Advanced ADL's Functional Problem(s): Simple Meal Preparation;Light Housekeeping OT Transfers Functional Problem(s): Toilet;Tub/Shower OT Plan OT Intensity: Minimum of 1-2 x/day, 45 to 90 minutes OT Frequency: 5 out of 7 days OT Duration/Estimated Length of Stay: 7-10 days OT Treatment/Interventions: Balance/vestibular training;Discharge planning;DME/adaptive equipment instruction;Functional mobility training;Patient/family education;Self Care/advanced ADL retraining;Therapeutic Activities;Therapeutic Exercise;UE/LE Coordination activities OT Self Feeding Anticipated Outcome(s): independent OT Basic Self-Care Anticipated Outcome(s): set up/supervision OT Toileting Anticipated Outcome(s): supervision OT Bathroom Transfers Anticipated Outcome(s): supervision OT Recommendation Patient destination: Home Follow Up Recommendations: Home health OT Equipment Recommended: Tub/shower bench   OT Evaluation Precautions/Restrictions  Precautions Precautions: Fall Precaution Comments: bradycardia Restrictions Weight Bearing Restrictions: No General   Vital Signs Therapy Vitals Temp: 97.7 F (36.5 C) Pulse Rate: 70 Resp: 19 BP: 122/60 Patient Position (if appropriate): Sitting Oxygen Therapy SpO2: 96 % O2 Device: Room Air Pain Pain Assessment Pain Scale: 0-10 Pain Score: 0-No pain Home Living/Prior Functioning Home Living Family/patient expects to be discharged to:: Private residence Living Arrangements: Spouse/significant  other Available Help at Discharge: Family Type of Home: House Home Access: Ramped entrance Home Layout: One level Bathroom Shower/Tub: Chiropodist: Handicapped height Bathroom Accessibility: Yes Additional Comments: Wife unable to physically assist  Lives With: Spouse Prior Function Level of Independence: Requires assistive device for independence,Independent with basic ADLs,Independent with homemaking with ambulation,Independent with gait,Independent with transfers Driving: Yes Vocation: Retired Comments: Was using RW since having weakness. Vision Baseline Vision/History: Wears glasses Wears Glasses: At all times Patient Visual Report: No change from baseline Vision Assessment?: No apparent visual deficits Eye Alignment: Within Functional Limits Ocular Range of Motion: Within Functional Limits Alignment/Gaze Preference: Within Defined Limits Tracking/Visual Pursuits: Able to track stimulus in all quads without difficulty Visual Fields: No apparent deficits Perception  Perception: Within Functional Limits Praxis Praxis: Intact Cognition Overall Cognitive Status: Within Functional Limits for tasks assessed Arousal/Alertness: Awake/alert Orientation Level: Person;Place;Situation Person: Oriented Place: Oriented Situation: Oriented Year: 2022 Month: January Day of Week: Correct Memory: Appears intact Immediate Memory Recall: Sock;Blue;Bed Memory Recall Sock: Without Cue Memory Recall Blue: Without Cue Memory Recall Bed: Not able to recall Awareness: Appears intact Problem Solving: Appears intact Safety/Judgment: Appears intact Sensation Sensation Light Touch: Impaired by gross assessment Additional Comments: upper body sensation intact for hot/cold and light touch Coordination Fine Motor Movements are Fluid and Coordinated: Yes Finger Nose Finger Test: [facility] Motor  Motor Motor: Abnormal tone;Abnormal postural alignment and control Motor -  Skilled Clinical Observations: generalized weakness B LE  Trunk/Postural Assessment  Cervical Assessment Cervical Assessment: Within Functional Limits Thoracic Assessment Thoracic Assessment: Exceptions to WFL (kyphosis) Lumbar Assessment Lumbar Assessment: Exceptions to [facility] (posterior pelvic tilt) Postural Control Postural Control: Deficits on evaluation Righting Reactions: delayed and inadequate Protective Responses: delayed and inadequate  Balance Dynamic Sitting Balance Dynamic Sitting - Level of Assistance: 5: Stand by assistance Static Standing Balance Static Standing - Level of Assistance: 4: Min assist Dynamic Standing Balance Dynamic Standing - Level of Assistance: 3: Mod  assist Extremity/Trunk Assessment RUE Assessment RUE Assessment: Within Functional Limits LUE Assessment LUE Assessment: Within Functional Limits  Care Tool Care Tool Self Care Eating   Eating Assist Level: Set up assist    Oral Care    Oral Care Assist Level: Set up assist    Bathing   Body parts bathed by patient: Right arm;Left arm;Chest;Abdomen;Front perineal area;Buttocks;Right upper leg;Left upper leg;Right lower leg;Left lower leg;Face Body parts bathed by helper: Buttocks;Right lower leg;Left lower leg   Assist Level: Minimal Assistance - Patient > 75%    Upper Body Dressing(including orthotics)   What is the patient wearing?: Pull over shirt   Assist Level: Supervision/Verbal cueing    Lower Body Dressing (excluding footwear)   What is the patient wearing?: Pants Assist for lower body dressing: Minimal Assistance - Patient > 75%    Putting on/Taking off footwear   What is the patient wearing?: Non-skid slipper socks;Ted hose Assist for footwear: Total Assistance - Patient < 25%       Care Tool Toileting Toileting activity   Assist for toileting: Minimal Assistance - Patient > 75%     Care Tool Bed Mobility Roll left and right activity   Roll left and right assist level:  Supervision/Verbal cueing    Sit to lying activity   Sit to lying assist level: Moderate Assistance - Patient 50 - 74%    Lying to sitting edge of bed activity   Lying to sitting edge of bed assist level: Moderate Assistance - Patient 50 - 74%     Care Tool Transfers Sit to stand transfer   Sit to stand assist level: Moderate Assistance - Patient 50 - 74%    Chair/bed transfer   Chair/bed transfer assist level: Moderate Assistance - Patient 50 - 74%     Toilet transfer   Assist Level: Moderate Assistance - Patient 50 - 74%     Care Tool Cognition Expression of Ideas and Wants Expression of Ideas and Wants: Without difficulty (complex and basic) - expresses complex messages without difficulty and with speech that is clear and easy to understand   Understanding Verbal and Non-Verbal Content Understanding Verbal and Non-Verbal Content: Understands (complex and basic) - clear comprehension without cues or repetitions   Memory/Recall Ability *first 3 days only Memory/Recall Ability *first 3 days only: Current season;Staff names and faces;Location of own room;That he or she is in a hospital/hospital unit    Refer to Care Plan for [place] 1 OT Short Term Goal 1 (Week 1): STG = LTG due to estimated LOS - overall set up/supervision level for functional transfers and self care with assistive devices/DME  Recommendations for other services: None    Skilled Therapeutic Intervention   Patient seated in recliner.  He is pleasant, cooperative and aware of needs.  He denies pain at this time but states that peripheral neuropathy can be painful at times and impacts mobility.  Reviewed role of OT , schedule for therapy and plan of care.  Evaluation completed as documented above.  Patient presents with lower extremity weakness and general deconditioning limiting mobility and safety with completing adl and home management tasks.  He is an excellent candidate for IP  rehab to maximize strength and mobility in addition to exploring DME and assistive device options.  He demonstrates good understanding and effort t/o session.  ADL training and shower completed this session with focus on functional mobility and safe options to complete dressing and grooming  tasks (see below)  He returned to recliner at close of session - he states that he feels better after a shower and is eager to continue with therapy process.  Chair alarm set and call bell/tray table in reach.     ADL ADL Eating: Set up Where Assessed-Eating: Chair Grooming: Setup Where Assessed-Grooming: Sitting at sink Upper Body Bathing: Supervision/safety Where Assessed-Upper Body Bathing: Shower Lower Body Bathing: Minimal assistance Where Assessed-Lower Body Bathing: Shower Upper Body Dressing: Setup Where Assessed-Upper Body Dressing: Chair Lower Body Dressing: Moderate assistance Where Assessed-Lower Body Dressing: Chair Toileting: Moderate assistance Where Assessed-Toileting: Glass blower/designer: Moderate assistance Toilet Transfer Method: Counselling psychologist: Raised toilet seat;Grab bars Social research officer, government: Moderate assistance Social research officer, government Method: Heritage manager: Transfer tub bench;Grab bars Mobility  Transfers Sit to Stand: Moderate Assistance - Patient 50-74% Stand to Sit: Moderate Assistance - Patient 50-74%   Discharge Criteria: Patient will be discharged from OT if patient refuses treatment 3 consecutive times without medical reason, if treatment goals not met, if there is a change in medical status, if patient makes no progress towards goals or if patient is discharged from hospital.  The above assessment, treatment plan, treatment alternatives and goals were discussed and mutually agreed upon: by patient  Carlos Levering 12/24/2020, 4:04 PM

## 2020-12-24 NOTE — Progress Notes (Signed)
Sorbitol given no bowel movement at this time. Patient will start laxative tonight.
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
## 2020-12-24 NOTE — Progress Notes (Signed)
Patient placed self on CPAP.  Patient resting well.

## 2020-12-25 ENCOUNTER — Inpatient Hospital Stay (HOSPITAL_COMMUNITY): Payer: No Typology Code available for payment source | Admitting: Physical Therapy

## 2020-12-25 ENCOUNTER — Inpatient Hospital Stay (HOSPITAL_COMMUNITY): Payer: No Typology Code available for payment source

## 2020-12-25 DIAGNOSIS — L8990 Pressure ulcer of unspecified site, unspecified stage: Secondary | ICD-10-CM | POA: Insufficient documentation

## 2020-12-25 DIAGNOSIS — G6182 Multifocal motor neuropathy: Secondary | ICD-10-CM | POA: Diagnosis not present

## 2020-12-25 MED ORDER — MAGNESIUM CITRATE PO SOLN
1.0000 | Freq: Once | ORAL | Status: AC
  Administered 2020-12-25: 1 via ORAL
  Filled 2020-12-25: qty 296

## 2020-12-25 MED ORDER — BISACODYL 10 MG RE SUPP
10.0000 mg | Freq: Every day | RECTAL | Status: DC | PRN
  Filled 2020-12-25: qty 1

## 2020-12-25 NOTE — Progress Notes (Signed)
Physical Therapy Session Note  Patient Details  Name: Evan Kirk MRN: [id number] Date of Birth: 09-30-1941  Today's Date: 12/25/2020 PT Individual Time: 1045-1200 PT Individual Time Calculation (min): 75 min   Short Term Goals: Week 1:  PT Short Term Goal 1 (Week 1): Patient will complete supine <> sit with MinA PT Short Term Goal 2 (Week 1): Patient will be able to complete basic transfers with CGA and LRAD PT Short Term Goal 3 (Week 1): Patient will ambulate >102f with MinA and LRAD  Skilled Therapeutic Interventions/Progress Updates: Pt presented in recliner agreeable to therapy. Pt denies pain but states some discomfort due to being constipated. Pt performed STS from recliner with CGA and performed ambulatory transfer to w/c and transported to rehab gym for energy conservation> Performed ambualtory transfer to mat with CGA. Pt participate din STS x 5 with CGA without AD for BLE strengthening. Pt performed alternating toe taps x 5 bilaterally with pt noting increased exertion. After seated rest pt performed again with improved tolerance x 12 bilaterally. Participated in x 2 bouts of horseshoes first bout with RW second bout without AD initially requiring minA for balance but improving to CGA without AD. Performed gait training x 488fwith RW and CGA then an additional 7547fith mild R knee instability noted with fatigue. Pt transported remaining distance to Ortho gym and performed stand pivot to NuStep. Participated in NuStep x 5 min L2 with pt maintaining 40-50 SPM and noted 6/10 on mBORG. After seated rest performed stand pivot with RW to w/c in same manner as prior and PTA transported pt back to room. Pt returned to recliner via stand pivot and CGA. Pt left in recliner at end of session resting comfortably with belt alarm on, call bell within reach and needs met.      Therapy Documentation Precautions:  Precautions Precautions: Fall Precaution Comments:  bradycardia Restrictions Weight Bearing Restrictions: No General:   Vital Signs: Therapy Vitals Temp: 97.7 F (36.5 C) Pulse Rate: 62 Resp: 19 BP: 133/67 Patient Position (if appropriate): Sitting Oxygen Therapy SpO2: 98 % O2 Device: Room Air Pain: Pain Assessment Pain Score: 0-No pain   Therapy/Group: Individual Therapy  [name]  [name], PTA  12/25/2020, 4:14 PM

## 2020-12-25 NOTE — Progress Notes (Signed)
RN gave soap suds enema due to pt being impacted with stool. Enema unsuccessful. Pt had hard nodules, but was unable to pass large stool. RN will continue to monitor. Pt in no acute distress.

## 2020-12-25 NOTE — Progress Notes (Signed)
[facility] PHYSICAL MEDICINE & REHABILITATION PROGRESS NOTE   Subjective/Complaints:  Got sorbitol and soap suds enema last night- passed a few hard balls, but nothing else- Will give Mg citrate dose at 2pm, after therapy today.  Can follow with suppository, etc if need be.   Notes has a "wound" on his penis- not on buttocks.     ROS:  Pt denies SOB, abd pain, CP, N/V/C/D, and vision changes   Objective:   No results found. Recent Labs    12/24/20 0502  WBC 9.6  HGB 12.7*  HCT 40.0  PLT 401*   Recent Labs    12/24/20 0502  NA 138  K 4.1  CL 102  CO2 25  GLUCOSE 106*  BUN 19  CREATININE 1.11  CALCIUM 10.3    Intake/Output Summary (Last 24 hours) at 12/25/2020 0920 Last data filed at 12/25/2020 0000 Gross per 24 hour  Intake 480 ml  Output 450 ml  Net 30 ml     Pressure Injury 12/23/20 Coccyx Medial Stage 1 -  Intact skin with non-blanchable redness of a localized area usually over a bony prominence. (Active)  12/23/20 1603  Location: Coccyx  Location Orientation: Medial  Staging: Stage 1 -  Intact skin with non-blanchable redness of a localized area usually over a bony prominence.  Wound Description (Comments):   Present on Admission: Yes    Physical Exam: Vital Signs Blood pressure (!) 107/39, pulse 71, temperature 97.9 F (36.6 C), resp. rate 17, height 5\' 6"  (1.676 m), weight 99.9 kg, SpO2 99 %.   Constitutional:  awake, alert, sleepy, has CPAP in place, entire mask, woke appropriately, NAD HENT: CPAP full mask Cardiovascular: irregularly, irregular- rate controlled Pulmonary: CTA B/L- no W/R/R- good air movement Abdominal: soft, but still distended, NT, hypoactive Musculoskeletal:     Cervical back: Normal range of motion and neck supple.     Comments: No edema or tenderness in extremities  Skin:    General: Skin is warm and dry. Has a very red spot on his penis- just behind the head of penis- not open, but looks raw/angry; Also has a little  blanchable redness between buttocks cheeks- I don't see anything on coccyx like described.   Neurological: Ox3, sleepy still Follows commands Motor: Bilateral upper extremities: 5/5 proximal distal Right lower extremity: Hip flexion, knee extension 2/5, dorsiflexion 4/5 Left lower extremity: Hip flexion, knee extension 2+/5 plantarflexion 4/5 Left facial weakness  Psychiatric:  appropriate    Assessment/Plan: 1. Functional deficits which require 3+ hours per day of interdisciplinary therapy in a comprehensive inpatient rehab setting.  Physiatrist is providing close team supervision and 24 hour management of active medical problems listed below.  Physiatrist and rehab team continue to assess barriers to discharge/monitor patient progress toward functional and medical goals  Care Tool:  Bathing    Body parts bathed by patient: Right arm,Left arm,Chest,Abdomen,Front perineal area,Buttocks,Right upper leg,Left upper leg,Right lower leg,Left lower leg,Face   Body parts bathed by helper: Buttocks,Right lower leg,Left lower leg     Bathing assist Assist Level: Minimal Assistance - Patient > 75%     Upper Body Dressing/Undressing Upper body dressing   What is the patient wearing?: Pull over shirt    Upper body assist Assist Level: Supervision/Verbal cueing    Lower Body Dressing/Undressing Lower body dressing      What is the patient wearing?: Pants     Lower body assist Assist for lower body dressing: Minimal Assistance - Patient > 75%  Toileting Toileting    Toileting assist Assist for toileting: Minimal Assistance - Patient > 75% Assistive Device Comment:  (Urinal)   Transfers Chair/bed transfer  Transfers assist     Chair/bed transfer assist level: Moderate Assistance - Patient 50 - 74%     Locomotion Ambulation   Ambulation assist      Assist level: Moderate Assistance - Patient 50 - 74% Assistive device: Walker-rolling Max distance: 20    Walk 10 feet activity   Assist     Assist level: Moderate Assistance - Patient - 50 - 74% Assistive device: Walker-rolling   Walk 50 feet activity   Assist Walk 50 feet with 2 turns activity did not occur: Safety/medical concerns         Walk 150 feet activity   Assist Walk 150 feet activity did not occur: Safety/medical concerns         Walk 10 feet on uneven surface  activity   Assist Walk 10 feet on uneven surfaces activity did not occur: Safety/medical concerns         Wheelchair     Assist Will patient use wheelchair at discharge?: Yes Type of Wheelchair: Manual    Wheelchair assist level: Supervision/Verbal cueing Max wheelchair distance: 150    Wheelchair 50 feet with 2 turns activity    Assist        Assist Level: Supervision/Verbal cueing   Wheelchair 150 feet activity     Assist      Assist Level: Supervision/Verbal cueing   Blood pressure (!) 107/39, pulse 71, temperature 97.9 F (36.6 C), resp. rate 17, height 5\' 6"  (1.676 m), weight 99.9 kg, SpO2 99 %.  Medical Problem List and Plan: 1. 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             -patient may shower             -ELOS/Goals: 14-17 days/Supervision/min a             Admit to CIR 2.  Antithrombotics: -DVT/anticoagulation: Eliquis             -antiplatelet therapy: N/A 3. Pain Management: Lyrica 25 mg twice daily  1/6- pain is chronic, but overall controlled- con't regimen 4. Mood: Provide emotional support.               -antipsychotic agents: N/A 5. Neuropsych: This patient is capable of making decisions on his own behalf. 6. Skin/Wound Care: Routine skin checks  1/6- Possible pressure ulcer- will check skin  1/7- has stage II on top of penis behind head and some blanchable redness between buttocks/cheeks- con't creams that nursing is using per pt.  7. Fluids/Electrolytes/Nutrition: Routine in and outs              CMP ordered for tomorrow. 8.  Atrial fibrillation.  Continue Eliquis.  Cardiac rate controlled             Monitor with increased activity  1/6- rate bradycardic this AM- will monitor closely- might need to titrate meds  1/7- Rate 70s this AM- con't regimen 9.  OSA.  CPAP 10.  Active Recurrent renal carcinoma.  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 Hyperlipidemia: Lipitor 12. 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 Hyponatremia             Sodium 134 on 1/3, CMP ordered for tomorrow   15. Prediabetes?  1/6- BG 106 this AM, but A1c is 5.5- will monitor for stress rxn/hyperglycemia 16. Constipation-  1/7- no BM for 5+ days- failed sorbitol and soap suds enema- will give Mg citrate in addition to miralax  And senokot already ordered and suppository if needed afterwards. Is ordered.      LOS: 2 days A FACE TO FACE EVALUATION WAS PERFORMED  Artis Beggs 12/25/2020, 9:20 AM

## 2020-12-25 NOTE — Care Management (Signed)
Inpatient Rehabilitation Center Individual Statement of Services  Patient Name:  Evan Kirk  Date:  12/25/2020  Welcome to the [facility].  Our goal is to provide you with an individualized program based on your diagnosis and situation, designed to meet your specific needs.  With this comprehensive rehabilitation program, you will be expected to participate in at least 3 hours of rehabilitation therapies Monday-Friday, with modified therapy programming on the weekends.  Your rehabilitation program will include the following services:  Physical Therapy (PT), Occupational Therapy (OT), Speech Therapy (ST), 24 hour per day rehabilitation nursing, Therapeutic Recreaction (TR), Psychology, Neuropsychology, Care Coordinator, Rehabilitation Medicine, Nutrition Services, Pharmacy Services and Other  Weekly team conferences will be held on Tuesdays to discuss your progress.  Your Inpatient Rehabilitation Care Coordinator will talk with you frequently to get your input and to update you on team discussions.  Team conferences with you and your family in attendance may also be held.  Expected length of stay: 7-14 days  Overall anticipated outcome: Supervision  Depending on your progress and recovery, your program may change. Your Inpatient Rehabilitation Care Coordinator will coordinate services and will keep you informed of any changes. Your Inpatient Rehabilitation Care Coordinator's name and contact numbers are listed  below.  The following services may also be recommended but are not provided by the [facility] will be made to provide these services after discharge if needed.  Arrangements include referral to agencies that provide these services.  Your insurance has been verified to be:  [facility] Medicare  Your  primary doctor is:  Coltan Spinello  Pertinent information will be shared with your doctor and your insurance company.  Inpatient Rehabilitation Care Coordinator:  Cathleen Corti [phone] or ([phone]  Information discussed with and copy given to patient by: Rana Snare, 12/25/2020, 11:25 AM

## 2020-12-25 NOTE — IPOC Note (Signed)
Overall Plan of Care [facility]) Patient Details Name: MARRION ACCOMANDO MRN: [id number] DOB: 12-29-1940  Admitting Diagnosis: Multifocal motor neuropathy [facility])  Hospital Problems: Principal Problem:   Multifocal motor neuropathy ([place]) Active Problems:   Cerebral infarction involving left cerebellar artery ([facility])   Pressure injury of skin     Functional Problem List: Nursing Endurance,Medication Management,Safety,Sensory  PT Balance,Endurance,Motor,Nutrition,Pain,Perception,Safety,Sensory  OT Balance,Endurance,Motor,Sensory  SLP    TR         Basic ADL's: OT Grooming,Bathing,Dressing,Toileting     Advanced  ADL's: OT Simple Meal Preparation,Light Housekeeping     Transfers: PT Bed Mobility,Bed to [employer]  OT Toilet,Tub/Shower     Locomotion: PT Ambulation,Wheelchair Mobility,Stairs     Additional Impairments: OT    SLP        TR      Anticipated Outcomes Item Anticipated Outcome  Self Feeding independent  Swallowing      Basic self-care  set up/supervision  Toileting  supervision   Bathroom Transfers supervision  Bowel/Bladder  patient to remain continent of bowel/bladder  Transfers  Supervision with AD  Locomotion  Supervision household distances with AD  Communication     Cognition     Pain  no pain  Safety/Judgment  able to call for help and express need   Therapy Plan: PT Intensity: Minimum of 1-2 x/day ,45 to 90 minutes PT Frequency: 5 out of 7 days PT Duration Estimated Length of Stay: 12-14 days OT Intensity: Minimum of 1-2 x/day, 45 to 90 minutes OT Frequency: 5 out of 7 days OT Duration/Estimated Length of Stay: 7-10 days     Due to the current state of emergency, patients may not be receiving their 3-hours of Medicare-mandated therapy.   Team Interventions: Nursing Interventions Patient/Family [employer] Management/Prevention,Discharge Planning  PT interventions Ambulation/gait  training,Cognitive remediation/compensation,Discharge planning,DME/adaptive equipment instruction,Functional mobility training,Pain management,Psychosocial support,Splinting/orthotics,Therapeutic Activities,UE/LE Strength taining/ROM,Visual/perceptual remediation/compensation,Balance/vestibular training,Community reintegration,Disease management/prevention,Functional electrical stimulation,Neuromuscular re-education,Patient/family education,Skin care/wound management,Stair training,Therapeutic Exercise,UE/LE Museum/gallery conservator propulsion/positioning  OT Interventions Balance/vestibular training,Discharge planning,DME/adaptive equipment instruction,Functional mobility training,Patient/family education,Self Care/advanced ADL retraining,Therapeutic Activities,Therapeutic Exercise,UE/LE Coordination activities  SLP Interventions    TR Interventions    SW/CM Interventions Discharge Planning,Psychosocial Support,Patient/Family Education   Barriers to Discharge MD  Medical stability, Home enviroment access/loayout, Wound care, Pending chemo/radiation and CPAP and weakness due to motor neuropathy  Nursing      PT Decreased caregiver support decreased physical assist avilable at home  OT      SLP      SW       Team Discharge Planning: Destination: PT-Home ,OT- Home , SLP-Home Projected Follow-up: PT-Home health PT,Outpatient PT,24 hour supervision/assistance, OT-  Home health OT, SLP-None Projected Equipment Needs: PT- , OT- Tub/shower bench, SLP-None recommended by SLP Equipment Details: PT-TBD, OT-  Patient/family involved in discharge planning: PT- Patient,  OT-Patient, SLP-Patient  MD ELOS: 12-14 days Medical Rehab Prognosis:  Good Assessment:  Pt is a 80 yr old male with multifocal motor neuropathy and small stroke with worse RLE weakness- - unknown cause of neuropathy-  Also has Afib, OSA with CPAP, penile wound- Stage II and possible Stage I between buttocks, Cancer-  recurrent renal CA encasing IVC and A1c of 5.5  Goals supervision by d/c.    See Team Conference Notes for weekly updates to the plan of care

## 2020-12-25 NOTE — Plan of Care (Signed)
  Problem: Consults Goal: RH STROKE PATIENT EDUCATION Description: See Patient Education module for education specifics  Outcome: Progressing   Problem: RH BOWEL ELIMINATION Goal: RH STG MANAGE BOWEL W/MEDICATION W/ASSISTANCE Description: STG Manage Bowel with Medication with Mod I Assistance. Outcome: Progressing   Problem: RH SKIN INTEGRITY Goal: RH STG SKIN FREE OF INFECTION/BREAKDOWN Description: Mod I Outcome: Progressing Goal: RH STG MAINTAIN SKIN INTEGRITY WITH ASSISTANCE Description: STG Maintain Skin Integrity With Mod I Assistance. Outcome: Progressing   Problem: RH SAFETY Goal: RH STG ADHERE TO SAFETY PRECAUTIONS W/ASSISTANCE/DEVICE Description: STG Adhere to Safety Precautions With Mod I Assistance/Device. Outcome: Progressing Goal: RH STG DECREASED RISK OF FALL WITH ASSISTANCE Description: STG Decreased Risk of Fall With Mod I Assistance. Outcome: Progressing

## 2020-12-25 NOTE — Progress Notes (Signed)
Inpatient Rehabilitation Care Coordinator Assessment and Plan Patient Details  Name: Evan Kirk MRN: [id number] Date of Birth: 1940/12/30  Today's Date: 12/25/2020  Hospital Problems: Principal Problem:   Multifocal motor neuropathy [facility]) Active Problems:   Cerebral infarction involving left cerebellar artery ([place])   Pressure injury of skin  Past Medical History:  Past Medical History:  Diagnosis Date  . Anticoagulated on Coumadin    followed by [facility]  . Atrial fibrillation ([place])   . Back pain   . Bladder mass   . BPH (benign prostatic hypertrophy)   . Chronic atrial fibrillation ([place])   . Diastolic CHF, chronic [facility])    cardiologist-- dr Daneen Schick-- asymptomatic  . Diverticulosis of colon   . Edema   . Essential tremor 06-2015  . History of urinary retention   . HTN (hypertension)   . OSA on CPAP   . Peripheral neuropathy    BILATERAL FEET  . Prediabetes   . PTSD (post-traumatic stress disorder)   . Renal cell carcinoma of left kidney [facility])    Past Surgical History:  Past Surgical History:  Procedure Laterality Date  . CYSTOSCOPY WITH BIOPSY N/A 10/09/2015   Procedure: CYSTOSCOPY WITH BIOPSY;  Surgeon: Cleon Gustin, MD;  Location: [facility];  Service: Urology;  Laterality: N/A;  . NEPHRECTOMY Left   . NEPHRECTOMY RADICAL Left 08/13/2015   [facility]-  . TOTAL KNEE ARTHROPLASTY Right 11-10-2008  . TRANSTHORACIC ECHOCARDIOGRAM  08-12-2015      moderate concentric LVH, ef 50-55-%/  trivial MR , PR and TR/  severe LAE and RAE  . VASECTOMY REVERSAL  1978   Social History:  reports that he quit smoking about 54 years ago. He has quit using smokeless tobacco. He reports that he does not drink alcohol and does not use drugs.  Family / Support Systems Marital Status: Married How Long?: 70 years Patient Roles: Spouse,Partner Spouse/Significant Other: Evan Kirk (wife): [phone] Children: 5 adult sons Other Supports:  children Anticipated Caregiver: Pt sons will assist PRN Ability/Limitations of Caregiver: Pt wife not able to provide physical assistance. Pt reports that he was his wife's caregiver prior to admission. Caregiver Availability: 24/7 Family Dynamics: Pt lives with his wife.  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 [employer] Education: some college Read: Yes Write: Yes Employment Status: Retired Date Retired/Disabled/Unemployed: Retired Public relations account executive Issues: Denies Guardian/Conservator: N/A   Abuse/Neglect Abuse/Neglect Assessment Can Be Completed: Yes Physical Abuse: Denies Verbal Abuse: Denies Sexual Abuse: Denies Exploitation of patient/patient's resources: Denies Self-Neglect: Denies  Emotional Status Pt's affect, behavior and adjustment status: Pt in good spirits at time of visit Recent Psychosocial Issues: Pt admits to PTSD from serving in [employer], and states he continues to have dreams at times. Admits to situational depression since being here in hospital since he would like to go home and be with his wife. Psychiatric History: Admits to psych hx- PTSD/ depression/anxiety. Takes trazadone/tramadol on occasion for PSTD, and medication for depression/anxiety. Prescribed through [place]. Substance Abuse History: Pt admits that he quit drinking alcohol and smoking cigarettes June/July 1967 at the request of his now wife. Denies rec drug use.  Patient / Family Perceptions, Expectations & Goals Pt/Family understanding of illness & functional limitations: Pt and pt family have general understanding of pt care needs at discharge Premorbid pt/family roles/activities: Independent Anticipated changes in roles/activities/participation: Some assistance with ADLs/IADLs Pt/family expectations/goals:  pt would like to go home "soon." Pt would like ot be able to  walk, improve balance, and use a RW/cane; prefers no DME if possible.  Community Resources [facility] Scripts: None Premorbid Home Care/DME Agencies: None Transportation available at discharge: family  Discharge Planning Living Arrangements: Spouse/significant other,Children Support Systems: Spouse/significant other,Children,Friends/neighbors Type of Residence: Private residence Administrator, sports: Multimedia programmer (specify) ([place] Medicare and [place]) Financial Resources: [place] Referred: No Living Expenses: Own Money Management: Patient Does the patient have any problems obtaining your medications?: No Care Coordinator Anticipated Follow Up Needs: HH/OP Expected length of stay: 7-14 days  Clinical Impression SW met with pt in room to introduce self, explain role, and discuss discharge process. 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 Goes to [place]. Pt reports that he gets scripts filled through [place], and would like medications to go to [place]. Pt would also like any services that may be available to him through [place]. DME: cpap, quad cane, RW, ramp, grab bards in shower and around toilet. Pt states primary contact to discuss d/c is his son Evan Kirk [phone]). Pt aware SW to follow-up with his son.   - [facility] Dr. Carie Caddy; SW-Evan Kirk. SW left message for Gowen Laughlin/[facility] SW [phone]) to inform on pt admission and will follow-up once there is a d/c date for pt. SW requested follow-up as well.   SW spoke with pt son Evan Kirk to introduce self, explain role, and discuss d/c process. SW informed on pt ELOS: 7-14 days, and there will be follow-up after team conference on Tuesday.   Evan Kirk 12/25/2020, 3:14 PM

## 2020-12-25 NOTE — Progress Notes (Signed)
Occupational Therapy Session Note  Patient Details  Name: Evan Kirk MRN: [id number] Date of Birth: 1941/12/15  Today's Date: 12/25/2020 OT Individual Time: 5573-2202 OT Individual Time Calculation (min): 75 min    Short Term Goals: Week 1:  OT Short Term Goal 1 (Week 1): STG = LTG due to estimated LOS - overall set up/supervision level for functional transfers and self care with assistive devices/DME  Skilled Therapeutic Interventions/Progress Updates:    Pt resting in recliner upon arrival. Pt initially engaged in grooming tasks at sink and dressing with sit<>stand from w/c with CGA/supervision. Pt transitioned to tub room for review of bathing recommendations. Pt currently steps over into tub but realizes this is not a safe option. TTB demonstrated and pt states that is a good option. Pt amb with RW to ADL apartment and practiced recliner sit<>stand with CGA. Pt returned to room and amb with RW to recliner. Pt remained in recliner with all needs within reach and belt alarm activated.   Therapy Documentation Precautions:  Precautions Precautions: Fall Precaution Comments: bradycardia Restrictions Weight Bearing Restrictions: No  Pain:  Pt denies pain this morning   Therapy/Group: Individual Therapy  [name] 12/25/2020, 9:44 AM

## 2020-12-25 NOTE — Progress Notes (Signed)
Occupational Therapy Session Note  Patient Details  Name: Evan Kirk MRN: [id number] Date of Birth: [birth year]  Today's Date: 12/25/2020 OT Individual Time: 1345-1430 OT Individual Time Calculation (min): 45 min    Short Term Goals: Week 1:  OT Short Term Goal 1 (Week 1): STG = LTG due to estimated LOS - overall set up/supervision level for functional transfers and self care with assistive devices/DME  Skilled Therapeutic Interventions/Progress Updates:    Pt resting in recliner upon arrival and ready for therapy.  OT intervention with focus on BLE/BUE therex/endurance, standing balance, functional amb with RW, safety awraeness, and activity tolerance to increase independence with BADLs. Discussed tub equipment and pt provided handout. NuStep for 7 mins level 3 BUE/BLE. BITS activities 4x2 min. Rest break following activities. Pt amb with RW from ortho gym to room with standing rest breakx1. Pt returned to recliner and remained in recliner with all needs within reach and belt alarm activated.  Therapy Documentation Precautions:  Precautions Precautions: Fall Precaution Comments: bradycardia Restrictions Weight Bearing Restrictions: No Pain: Pain Assessment Pain Score: 0-No pain    Therapy/Group: Individual Therapy  [name] 12/25/2020, 2:46 PM

## 2020-12-26 ENCOUNTER — Inpatient Hospital Stay (HOSPITAL_COMMUNITY): Payer: Medicare Other

## 2020-12-26 ENCOUNTER — Inpatient Hospital Stay (HOSPITAL_COMMUNITY): Payer: Medicare Other | Admitting: Physical Therapy

## 2020-12-26 DIAGNOSIS — K5901 Slow transit constipation: Secondary | ICD-10-CM

## 2020-12-26 DIAGNOSIS — I482 Chronic atrial fibrillation, unspecified: Secondary | ICD-10-CM

## 2020-12-26 NOTE — Progress Notes (Signed)
Physical Therapy Session Note  Patient Details  Name: Evan Kirk MRN: [id number] Date of Birth: 1941/02/20  Today's Date: 12/26/2020 PT Individual Time: 8242-3536 PT Individual Time Calculation (min): 45 min   Short Term Goals: Week 1:  PT Short Term Goal 1 (Week 1): Patient will complete supine <> sit with MinA PT Short Term Goal 2 (Week 1): Patient will be able to complete basic transfers with CGA and LRAD PT Short Term Goal 3 (Week 1): Patient will ambulate >89ft with MinA and LRAD  Skilled Therapeutic Interventions/Progress Updates:    Pt received sitting in recliner and agreeable to therapy session. Sit<>stands using RW with CGA for steadying/safety throughout session - occasional cuing for proper UE placement when using AD. Gait training ~149ft to main therapy gym using RW with CGA for steadying - demos reciprocal stepping pattern but decreased gait speed. Dynamic gait training in // bars via lateral side stepping with B UE support progressed to no UE support down/back x4 with CGA and backwards walking down/back x4 with B UE support progressed to no UE support with min assist for balance due to posterior lean/LOB.  In // bars performed the following B LE strengthening and standing balance tasks: - mini-squats 2x10reps without UE support and CGA/min assist for steadying/balance due to slight posterior lean  - heel raises 2x12 reps no UE support with min assist for balance - R LE repeated step up/down on/off 4" step with B UE support progressed to no UE support x15 reps, min assist for balance due to posterior lean after stepping down; attempted but unable to perform with L LE due to knee pain Pt requires seated rest breaks between activities due to SOB and LE fatigue. Gait training ~152ft back to room using RW with CGA for steadying and pt demoing a slight antalgic gait due to L knee pain, though able to maintain reciprocal pattern with continued decreased gait speed. Pt left seated in  recliner with needs in reach and chair alarm on.  Therapy Documentation Precautions:  Precautions Precautions: Fall Precaution Comments: bradycardia Restrictions Weight Bearing Restrictions: No  Pain:  Reports L knee soreness, chronic, that increased after mini-squat exercise; performed seated long arc quads/AROM for pain management and provided seated rest breaks.    Therapy/Group: Individual Therapy  Tawana Scale , PT, DPT, CSRS  12/26/2020, 12:26 PM

## 2020-12-26 NOTE — Plan of Care (Signed)
  Problem: Consults Goal: RH STROKE PATIENT EDUCATION Description: See Patient Education module for education specifics  Outcome: Progressing   Problem: RH BOWEL ELIMINATION Goal: RH STG MANAGE BOWEL W/MEDICATION W/ASSISTANCE Description: STG Manage Bowel with Medication with Mod I Assistance. Outcome: Progressing   Problem: RH SKIN INTEGRITY Goal: RH STG SKIN FREE OF INFECTION/BREAKDOWN Description: Mod I Outcome: Progressing Goal: RH STG MAINTAIN SKIN INTEGRITY WITH ASSISTANCE Description: STG Maintain Skin Integrity With Mod I Assistance. Outcome: Progressing   Problem: RH SAFETY Goal: RH STG ADHERE TO SAFETY PRECAUTIONS W/ASSISTANCE/DEVICE Description: STG Adhere to Safety Precautions With Mod I Assistance/Device. Outcome: Progressing Goal: RH STG DECREASED RISK OF FALL WITH ASSISTANCE Description: STG Decreased Risk of Fall With Mod I Assistance. Outcome: Progressing   

## 2020-12-26 NOTE — Progress Notes (Signed)
Occupational Therapy Session Note  Patient Details  Name: Evan Kirk MRN: [id number] Date of Birth: 02-Sep-1941  Today's Date: 12/26/2020 OT Individual Time: 9798-9211 OT Individual Time Calculation (min): 42 min    Short Term Goals: Week 1:  OT Short Term Goal 1 (Week 1): STG = LTG due to estimated LOS - overall set up/supervision level for functional transfers and self care with assistive devices/DME  Skilled Therapeutic Interventions/Progress Updates:    1:1. Pt received in bed agreeable to OT with pain in R knee but EN alerted to deliver medication and pt declines further pain intervention. Pt completes UB dressing with set up and LB dressing with CGA at sit to stand level. Pt able to change socks at EOB with S scooting laterally and hiking hip up into external rotation while supported on the bed. Pt completes ambautlory transfer into bathroom with CGA overall to stand and void bladder. Pt stands for grooming (face washing, oral care and hair combing) at the sink. Exited session with pt seated in recliner, exit alarm on and call light in reach   Therapy Documentation Precautions:  Precautions Precautions: Fall Precaution Comments: bradycardia Restrictions Weight Bearing Restrictions: No General:   Vital Signs: Therapy Vitals Pulse Rate: 73 BP: (!) 107/48 Oxygen Therapy SpO2: 95 % Pain:   ADL: ADL Eating: Set up Where Assessed-Eating: Chair Grooming: Setup Where Assessed-Grooming: Sitting at sink Upper Body Bathing: Supervision/safety Where Assessed-Upper Body Bathing: Shower Lower Body Bathing: Minimal assistance Where Assessed-Lower Body Bathing: Shower Upper Body Dressing: Setup Where Assessed-Upper Body Dressing: Chair Lower Body Dressing: Moderate assistance Where Assessed-Lower Body Dressing: Chair Toileting: Moderate assistance Where Assessed-Toileting: Glass blower/designer: Moderate assistance Toilet Transfer Method: Information systems manager: Raised toilet seat,Grab bars Social research officer, government: Moderate assistance Social research officer, government Method: Heritage manager: Research scientist (life sciences)   Exercises:   Other Treatments:     Therapy/Group: Individual Therapy  Tonny Branch 12/26/2020, 8:46 AM

## 2020-12-26 NOTE — Progress Notes (Signed)
Occupational Therapy Session Note  Patient Details  Name: Evan Kirk MRN: [id number] Date of Birth: 1941-07-01  Today's Date: 12/26/2020 OT Individual Time: 0177-9390 OT Individual Time Calculation (min): 75 min    Short Term Goals: Week 1:  OT Short Term Goal 1 (Week 1): STG = LTG due to estimated LOS - overall set up/supervision level for functional transfers and self care with assistive devices/DME  Skilled Therapeutic Interventions/Progress Updates:    OT session focused on ADL retraining, balance, endurance, and functional mobility. Pt received sitting in recliner with son present. Donned new slip on shoes with min A then ambulated to bathroom with min A using RW. Pt with loose stool requiring change in LB clothing and donning of incontinent brief. OT provided min A for toileting hygiene and donning of LB clothing. Ambulated back to chair for rest break then ambulated to therapy gym with min/CGA using RW. Engaged in dynamic balance task 2x ~3 minutes with min/CGA for balance. At end of session, pt ambulated back to room with min A and left in recliner with all needs in reach.   Therapy Documentation Precautions:  Precautions Precautions: Fall Precaution Comments: bradycardia Restrictions Weight Bearing Restrictions: No General:   Vital Signs:  Pain:   ADL: ADL Eating: Set up Where Assessed-Eating: Chair Grooming: Setup Where Assessed-Grooming: Sitting at sink Upper Body Bathing: Supervision/safety Where Assessed-Upper Body Bathing: Shower Lower Body Bathing: Minimal assistance Where Assessed-Lower Body Bathing: Shower Upper Body Dressing: Setup Where Assessed-Upper Body Dressing: Chair Lower Body Dressing: Moderate assistance Where Assessed-Lower Body Dressing: Chair Toileting: Moderate assistance Where Assessed-Toileting: Glass blower/designer: Moderate assistance Toilet Transfer Method: Counselling psychologist: Raised toilet seat,Grab  bars Social research officer, government: Moderate assistance Social research officer, government Method: Heritage manager: Research scientist (life sciences)   Exercises:   Other Treatments:     Therapy/Group: Individual Therapy  Duayne Cal 12/26/2020, 12:42 PM

## 2020-12-26 NOTE — Progress Notes (Signed)
Physical Therapy Session Note  Patient Details  Name: Evan Kirk MRN: [id number] Date of Birth: [birth year]  Today's Date: 12/26/2020 PT Individual Time: 4270-6237 PT Individual Time Calculation (min): 40 min   Short Term Goals: Week 1:  PT Short Term Goal 1 (Week 1): Patient will complete supine <> sit with MinA PT Short Term Goal 2 (Week 1): Patient will be able to complete basic transfers with CGA and LRAD PT Short Term Goal 3 (Week 1): Patient will ambulate >5f with MinA and LRAD  Skilled Therapeutic Interventions/Progress Updates:   Pt received sitting in WC and agreeable to PT. Gait training through hall 2x 1219fwith RW and CGA for clothing management. Noted mild crepitus in the L knee intermittently.  All transfers performed with supervision assist throughout session x 10 with 1 UE support on RW. BITS Visual scanning with center fixation 95% for targets, 50% for fixation; 2 x 2 min. trailmaking A and B with number sequence and then number/letter. 1.75 min and then 2.5 min.  Visual motor tracing cresent with 98%. CGA for clothing management with min assist x 1 due to mild knee instability. Patient returned to room and left sitting in recliner with call bell in reach and all needs met.           Therapy Documentation Precautions:  Precautions Precautions: Fall Precaution Comments: bradycardia Restrictions Weight Bearing Restrictions: No   Pain: Pain Assessment Pain Scale: 0-10 Pain Score: 0-No pain    Therapy/Group: Individual Therapy  AuLorie Phenix/07/2021, 5:37 PM

## 2020-12-26 NOTE — Progress Notes (Signed)
Pt is able to put himself on CPAP for the night. He was told to call if he needed any help.

## 2020-12-26 NOTE — Progress Notes (Signed)
[place] PHYSICAL MEDICINE & REHABILITATION PROGRESS NOTE   Subjective/Complaints: Lying in bed. No new issues this morning. Pleased with rehab progress. Had multiple bm's over night  ROS: Patient denies fever, rash, sore throat, blurred vision, nausea, vomiting, diarrhea, cough, shortness of breath or chest pain, joint or back pain, headache, or mood change.    Objective:   No results found. Recent Labs    12/24/20 0502  WBC 9.6  HGB 12.7*  HCT 40.0  PLT 401*   Recent Labs    12/24/20 0502  NA 138  K 4.1  CL 102  CO2 25  GLUCOSE 106*  BUN 19  CREATININE 1.11  CALCIUM 10.3    Intake/Output Summary (Last 24 hours) at 12/26/2020 0853 Last data filed at 12/26/2020 0426 Gross per 24 hour  Intake 480 ml  Output 1050 ml  Net -570 ml     Pressure Injury 12/23/20 Coccyx Medial Stage 1 -  Intact skin with non-blanchable redness of a localized area usually over a bony prominence. (Active)  12/23/20 1603  Location: Coccyx  Location Orientation: Medial  Staging: Stage 1 -  Intact skin with non-blanchable redness of a localized area usually over a bony prominence.  Wound Description (Comments):   Present on Admission: Yes    Physical Exam: Vital Signs Blood pressure (!) 107/48, pulse 73, temperature 98.2 F (36.8 C), resp. rate 19, height 5\' 6"  (1.676 m), weight 99.9 kg, SpO2 95 %.  Constitutional: No distress . Vital signs reviewed. HEENT: EOMI, oral membranes moist Neck: supple Cardiovascular: RRR without murmur. No JVD    Respiratory/Chest: CTA Bilaterally without wheezes or rales. Normal effort    GI/Abdomen: BS +, non-tender, non-distended Ext: no clubbing, cyanosis, or edema Psych: pleasant and cooperative Musculoskeletal:     Cervical back: Normal range of motion and neck supple.     Comments: no edema/ tenderness in exts Skin:    General: Skin is warm and dry. Has a very red spot on his penis- just behind the head of penis- not open, but looks raw/angry;  Also has a little blanchable redness between buttocks cheeks- I don't see anything on coccyx like described.   Neurological: Ox3, fairly alert Follows commands Motor: Bilateral upper extremities: 5/5 proximal distal Right lower extremity: Hip flexion, knee extension 2/5, dorsiflexion 4/5 Left lower extremity: Hip flexion, knee extension 2+/5 plantarflexion 4/5 Left facial weakness ongoing Psychiatric:  appropriate    Assessment/Plan: 1. Functional deficits which require 3+ hours per day of interdisciplinary therapy in a comprehensive inpatient rehab setting.  Physiatrist is providing close team supervision and 24 hour management of active medical problems listed below.  Physiatrist and rehab team continue to assess barriers to discharge/monitor patient progress toward functional and medical goals  Care Tool:  Bathing    Body parts bathed by patient: Right arm,Left arm,Chest,Abdomen,Front perineal area,Buttocks,Right upper leg,Left upper leg,Right lower leg,Left lower leg,Face   Body parts bathed by helper: Buttocks,Right lower leg,Left lower leg     Bathing assist Assist Level: Minimal Assistance - Patient > 75%     Upper Body Dressing/Undressing Upper body dressing   What is the patient wearing?: Pull over shirt,Hospital gown only    Upper body assist Assist Level: Supervision/Verbal cueing    Lower Body Dressing/Undressing Lower body dressing      What is the patient wearing?: Pants,Incontinence brief     Lower body assist Assist for lower body dressing: Minimal Assistance - Patient > 75%     Toileting Toileting  Toileting assist Assist for toileting: Minimal Assistance - Patient > 75% Assistive Device Comment:  (urinal)   Transfers Chair/bed transfer  Transfers assist     Chair/bed transfer assist level: Contact Guard/Touching assist     Locomotion Ambulation   Ambulation assist      Assist level: Contact Guard/Touching assist Assistive  device: Walker-rolling Max distance: 75   Walk 10 feet activity   Assist     Assist level: Contact Guard/Touching assist Assistive device: Walker-rolling   Walk 50 feet activity   Assist Walk 50 feet with 2 turns activity did not occur: Safety/medical concerns  Assist level: Contact Guard/Touching assist Assistive device: Walker-rolling    Walk 150 feet activity   Assist Walk 150 feet activity did not occur: Safety/medical concerns         Walk 10 feet on uneven surface  activity   Assist Walk 10 feet on uneven surfaces activity did not occur: Safety/medical concerns         Wheelchair     Assist Will patient use wheelchair at discharge?: Yes Type of Wheelchair: Manual    Wheelchair assist level: Supervision/Verbal cueing Max wheelchair distance: 150    Wheelchair 50 feet with 2 turns activity    Assist        Assist Level: Supervision/Verbal cueing   Wheelchair 150 feet activity     Assist      Assist Level: Supervision/Verbal cueing   Blood pressure (!) 107/48, pulse 73, temperature 98.2 F (36.8 C), resp. rate 19, height 5\' 6"  (1.676 m), weight 99.9 kg, SpO2 95 %.  Medical Problem List and Plan: 1. 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             -patient may shower             -ELOS/Goals: 14-17 days/Supervision/min a             continue therapies 2.  Antithrombotics: -DVT/anticoagulation: Eliquis             -antiplatelet therapy: N/A 3. Pain Management: Lyrica 25 mg twice daily  1/6- pain is chronic, but overall controlled- con't regimen 4. Mood: Provide emotional support.               -antipsychotic agents: N/A 5. Neuropsych: This patient is capable of making decisions on his own behalf. 6. Skin/Wound Care: Routine skin checks  1/6- Possible pressure ulcer- will check skin  1/7- has stage II on top of penis behind head and some blanchable redness between buttocks/cheeks- con't  creams that nursing is using per pt.  7. Fluids/Electrolytes/Nutrition: Routine in and outs               8.  Atrial fibrillation.  Continue Eliquis.  Cardiac rate controlled             Monitor with increased activity  1/6- rate bradycardic this AM- will monitor closely- might need to titrate meds  1/7-8- Rate 70s   con't regimen 9.  OSA.  CPAP 10.  Active Recurrent renal carcinoma.  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 Hyperlipidemia: Lipitor 12. 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 Hyponatremia  Sodium 134 on 1/3, CMP ordered for tomorrow   15. Prediabetes?  1/6- BG 106 this AM, but A1c is 5.5- will monitor for stress rxn/hyperglycemia 16. Constipation-  1/7- no BM for 5+ days- failed sorbitol and soap suds enema- will give Mg citrate in addition to miralax  And senokot already ordered and suppository if needed afterwards. Is ordered.    1/8 pt with multiple bm's last night   -continue mtc regimen    LOS: 3 days A FACE TO FACE EVALUATION WAS PERFORMED  Meredith Staggers 12/26/2020, 8:53 AM

## 2020-12-27 NOTE — Plan of Care (Signed)
  Problem: Consults Goal: RH STROKE PATIENT EDUCATION Description: See Patient Education module for education specifics  Outcome: Progressing   Problem: RH BOWEL ELIMINATION Goal: RH STG MANAGE BOWEL W/MEDICATION W/ASSISTANCE Description: STG Manage Bowel with Medication with Mod I Assistance. Outcome: Progressing   Problem: RH SKIN INTEGRITY Goal: RH STG SKIN FREE OF INFECTION/BREAKDOWN Description: Mod I Outcome: Progressing Goal: RH STG MAINTAIN SKIN INTEGRITY WITH ASSISTANCE Description: STG Maintain Skin Integrity With Mod I Assistance. Outcome: Progressing   Problem: RH SAFETY Goal: RH STG ADHERE TO SAFETY PRECAUTIONS W/ASSISTANCE/DEVICE Description: STG Adhere to Safety Precautions With Mod I Assistance/Device. Outcome: Progressing Goal: RH STG DECREASED RISK OF FALL WITH ASSISTANCE Description: STG Decreased Risk of Fall With Mod I Assistance. Outcome: Progressing   

## 2020-12-27 NOTE — Progress Notes (Signed)
Pt able to place CPAP machine on himself without assistance.

## 2020-12-28 ENCOUNTER — Inpatient Hospital Stay (HOSPITAL_COMMUNITY): Payer: Medicare Other | Admitting: Physical Therapy

## 2020-12-28 ENCOUNTER — Inpatient Hospital Stay (HOSPITAL_COMMUNITY): Payer: No Typology Code available for payment source

## 2020-12-28 MED ORDER — BENZONATATE 100 MG PO CAPS
200.0000 mg | ORAL_CAPSULE | Freq: Three times a day (TID) | ORAL | Status: DC | PRN
  Administered 2020-12-28 – 2020-12-29 (×3): 200 mg via ORAL
  Filled 2020-12-28 (×3): qty 2

## 2020-12-28 MED ORDER — MAGNESIUM CITRATE PO SOLN
1.0000 | Freq: Once | ORAL | Status: DC
  Filled 2020-12-28: qty 296

## 2020-12-28 MED ORDER — MENTHOL 3 MG MT LOZG
1.0000 | LOZENGE | OROMUCOSAL | Status: DC | PRN
  Administered 2020-12-29: 3 mg via ORAL
  Filled 2020-12-28 (×2): qty 9

## 2020-12-28 NOTE — Progress Notes (Signed)
Pt able to place himself on CPAP.

## 2020-12-28 NOTE — Progress Notes (Signed)
Physical Therapy Session Note  Patient Details  Name: Evan Kirk MRN: [id number] Date of Birth: 06/21/1941  Today's Date: 12/28/2020 PT Individual Time: 7416-3845 PT Individual Time Calculation (min): 70 min   Short Term Goals: Week 1:  PT Short Term Goal 1 (Week 1): Patient will complete supine <> sit with MinA PT Short Term Goal 2 (Week 1): Patient will be able to complete basic transfers with CGA and LRAD PT Short Term Goal 3 (Week 1): Patient will ambulate >84ft with MinA and LRAD  Skilled Therapeutic Interventions/Progress Updates:    Pt received seated in recliner in room, agreeable to PT session. No complaints of pain at rest, has onset of L knee pain with mobility that decreases again at rest. Pt reports chronic pain in L knee due to OA. Sit to stand with CGA and use of QC initially. Ambulation x 100 ft with QC and min A for balance. Pt exhibits antalgic gait pattern due to L knee pain with mobility as well as decreased overall balance with use of QC. Ambulation x 150, x 200 ft with RW at CGA level. Pt exhibits improved gait mechanics, balance, and overall safety with use of RW as compared to QC. Pt agreeable to continue use of RW at this time. Pt has one near LOB anteriorly requiring mod A to recover while attempting to pull up his pants with no UE support. Sidesteps L/R with RW and CGA for balance, 2 x 20 ft each direction. Pt requires cues for increased LE clearance and decreased path deviation. Ambulation with RW through obstacle course with CGA to min A stepping over objects, weaving through cones, ascend/descend one 6" step. Standing alt L/R cone taps with RW and CGA for balance for LE coordination training. Pt left seated in recliner in room with needs in reach, chair alarm in place at end of session.  Therapy Documentation Precautions:  Precautions Precautions: Fall Precaution Comments: bradycardia Restrictions Weight Bearing Restrictions: No    Therapy/Group:  Individual Therapy   Excell Seltzer, PT, DPT  12/28/2020, 5:17 PM

## 2020-12-28 NOTE — Progress Notes (Signed)
Occupational Therapy Session Note  Patient Details  Name: Evan Kirk MRN: [id number] Date of Birth: 1941-05-19  Today's Date: 12/28/2020 OT Individual Time: 2706-2376 OT Individual Time Calculation (min): 57 min    Short Term Goals: Week 1:  OT Short Term Goal 1 (Week 1): STG = LTG due to estimated LOS - overall set up/supervision level for functional transfers and self care with assistive devices/DME  Skilled Therapeutic Interventions/Progress Updates:    Pt resting in recliner upon arrival. OT intervention with focus on sit<>stand, standing balance, functional amb with RW, dressing with sit<>stand from w/c, toileting, and activity tolerance to increase independence with BADLs. Pt amb with RW to bathroom and stood at toilet to urinate-CGA. Pt completed dressing tasks with sit<>stand from w/c-CGA and assistance to don Ted hose. Pt amb with RW to ortho gym and initially engaged in 7 mins therex on Nustep-level 4, 40 SPM. Pt amb to gym and stood at rebounder-15 with basketball and 15 with 1kg ball-CGA. Pt amb with RW 216' in hallway before resting-CGA. Pt amb back to room and remained in recliner with all needs within reach and seat alarm activated.   Therapy Documentation Precautions:  Precautions Precautions: Fall Precaution Comments: bradycardia Restrictions Weight Bearing Restrictions: (P) No Pain: Pain Assessment Pain Scale: 0-10 Pain Score: 0-No pain   Therapy/Group: Individual Therapy  [name] 12/28/2020, 9:03 AM

## 2020-12-28 NOTE — Progress Notes (Signed)
Patient has non-productive coughing episodes states he 'feel phlegm in his throat' at interval with cough, lungs assessed and diminshed bilaterally, negative for JVD or acute respiratory discomfort, No apparent  Fever, weakness  or nausea/vomiting ,takes po well. Request that MD be made aware in the morning so that he can get something for cough. Reassure patient this writer will pass this info on to oncoming shift RN,as well as leave a note for the MD to be notified,po Claritin provided earlier. Monitor and assisted

## 2020-12-28 NOTE — Progress Notes (Signed)
[facility] PHYSICAL MEDICINE & REHABILITATION PROGRESS NOTE   Subjective/Complaints:  Pt reports had several "squirts', but no "real BM".   Also told nurse that is coughing a lot- and wants something for cough.  Legs also sore from walking with therapy.  Fell asleep at 11:30 pm    ROS:  Pt denies SOB, abd pain, CP, N/V/C/D, and vision changes   Objective:   No results found. No results for input(s): WBC, HGB, HCT, PLT in the last 72 hours. No results for input(s): NA, K, CL, CO2, GLUCOSE, BUN, CREATININE, CALCIUM in the last 72 hours.  Intake/Output Summary (Last 24 hours) at 12/28/2020 0913 Last data filed at 12/28/2020 0739 Gross per 24 hour  Intake 880 ml  Output 1000 ml  Net -120 ml     Pressure Injury 12/23/20 Coccyx Medial Stage 1 -  Intact skin with non-blanchable redness of a localized area usually over a bony prominence. (Active)  12/23/20 1603  Location: Coccyx  Location Orientation: Medial  Staging: Stage 1 -  Intact skin with non-blanchable redness of a localized area usually over a bony prominence.  Wound Description (Comments):   Present on Admission: Yes    Physical Exam: Vital Signs Blood pressure (!) 84/44, pulse 71, temperature 97.8 F (36.6 C), temperature source Oral, resp. rate 18, height 5\' 6"  (1.676 m), weight 99.6 kg, SpO2 98 %.  Constitutional: No distress . Vital signs reviewed- sitting up in bedside chair, appropriate, no cough, NAD HEENT: EOMI, oral membranes moist Neck: supple Cardiovascular: RRR Respiratory/Chest: CTA B/L- no W/R/R- good air movement GI/Abdomen: Soft, NT, ND, (+)BS  Ext: no clubbing, cyanosis, or edema Psych: pleasant and cooperative Musculoskeletal:     Cervical back: Normal range of motion and neck supple.     Comments: no edema/ tenderness in exts Skin:    General: Skin is warm and dry. Has a very red spot on his penis- just behind the head of penis- not open, but looks raw/angry; Also has a little blanchable  redness between buttocks cheeks- I don't see anything on coccyx like described.   Neurological: Ox3- off CPAP this AM for first time.  Motor: Bilateral upper extremities: 5/5 proximal distal Right lower extremity: Hip flexion, knee extension 2/5, dorsiflexion 4/5 Left lower extremity: Hip flexion, knee extension 2+/5 plantarflexion 4/5 Left facial weakness ongoing Psychiatric:  appropriate    Assessment/Plan: 1. Functional deficits which require 3+ hours per day of interdisciplinary therapy in a comprehensive inpatient rehab setting.  Physiatrist is providing close team supervision and 24 hour management of active medical problems listed below.  Physiatrist and rehab team continue to assess barriers to discharge/monitor patient progress toward functional and medical goals  Care Tool:  Bathing    Body parts bathed by patient: Right arm,Left arm,Chest,Abdomen,Front perineal area,Buttocks,Right upper leg,Left upper leg,Right lower leg,Left lower leg,Face   Body parts bathed by helper: Buttocks,Right lower leg,Left lower leg     Bathing assist Assist Level: Minimal Assistance - Patient > 75%     Upper Body Dressing/Undressing Upper body dressing   What is the patient wearing?: Pull over shirt    Upper body assist Assist Level: Set up assist    Lower Body Dressing/Undressing Lower body dressing      What is the patient wearing?: Pants     Lower body assist Assist for lower body dressing: Supervision/Verbal cueing     Toileting Toileting    Toileting assist Assist for toileting: Minimal Assistance - Patient > 75% Assistive Device  Comment:  (urinal)   Transfers Chair/bed transfer  Transfers assist     Chair/bed transfer assist level: Contact Guard/Touching assist Chair/bed transfer assistive device: Therapist, sports   Ambulation assist      Assist level: Contact Guard/Touching assist Assistive device: Walker-rolling Max distance:  146ft   Walk 10 feet activity   Assist     Assist level: Contact Guard/Touching assist Assistive device: Walker-rolling   Walk 50 feet activity   Assist Walk 50 feet with 2 turns activity did not occur: Safety/medical concerns  Assist level: Contact Guard/Touching assist Assistive device: Walker-rolling    Walk 150 feet activity   Assist Walk 150 feet activity did not occur: Safety/medical concerns  Assist level: Contact Guard/Touching assist Assistive device: Walker-rolling    Walk 10 feet on uneven surface  activity   Assist Walk 10 feet on uneven surfaces activity did not occur: Safety/medical concerns         Wheelchair     Assist Will patient use wheelchair at discharge?: Yes Type of Wheelchair: Manual    Wheelchair assist level: Supervision/Verbal cueing Max wheelchair distance: 150    Wheelchair 50 feet with 2 turns activity    Assist        Assist Level: Supervision/Verbal cueing   Wheelchair 150 feet activity     Assist      Assist Level: Supervision/Verbal cueing   Blood pressure (!) 84/44, pulse 71, temperature 97.8 F (36.6 C), temperature source Oral, resp. rate 18, height 5\' 6"  (1.676 m), weight 99.6 kg, SpO2 98 %.  Medical Problem List and Plan: 1. 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             -patient may shower             -ELOS/Goals: 14-17 days/Supervision/min a             continue therapies 2.  Antithrombotics: -DVT/anticoagulation: Eliquis             -antiplatelet therapy: N/A 3. Pain Management: Lyrica 25 mg twice daily  1/6- pain is chronic, but overall controlled- con't regimen 4. Mood: Provide emotional support.               -antipsychotic agents: N/A 5. Neuropsych: This patient is capable of making decisions on his own behalf. 6. Skin/Wound Care: Routine skin checks  1/6- Possible pressure ulcer- will check skin  1/7- has stage II on top of penis behind  head and some blanchable redness between buttocks/cheeks- con't creams that nursing is using per pt.  7. Fluids/Electrolytes/Nutrition: Routine in and outs               8.  Atrial fibrillation.  Continue Eliquis.  Cardiac rate controlled             Monitor with increased activity  1/6- rate bradycardic this AM- will monitor closely- might need to titrate meds  1/7-8- Rate 70s   con't regimen 9.  OSA.  CPAP 10.  Active Recurrent renal carcinoma.  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 Hyperlipidemia: Lipitor 12. 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  14. Hyponatremia  Sodium 134 on 1/3, CMP ordered for tomorrow   15. Prediabetes?  1/6- BG 106 this AM, but A1c is 5.5- will monitor for stress rxn/hyperglycemia 16. Constipation-  1/7- no BM for 5+ days- failed sorbitol and soap suds enema- will give Mg citrate in addition to miralax  And senokot already ordered and suppository if needed afterwards. Is ordered.    1/8 pt with multiple bm's last night   -continue mtc regimen  1/10- asking for Mg citrate again- will order after therapy. LBM Thursday except for "squirts".  17. Coughing  1/10- will give tessalon perles TID prn for cough     LOS: 5 days A FACE TO FACE EVALUATION WAS PERFORMED  Alonna Bartling 12/28/2020, 9:13 AM

## 2020-12-28 NOTE — Progress Notes (Signed)
Occupational Therapy Session Note  Patient Details  Name: MALACHI KINZLER MRN: [id number] Date of Birth: [birth year]  Today's Date: 12/28/2020 OT Individual Time: 1100-1200 OT Individual Time Calculation (min): 60 min    Short Term Goals: Week 1:  OT Short Term Goal 1 (Week 1): STG = LTG due to estimated LOS - overall set up/supervision level for functional transfers and self care with assistive devices/DME  Skilled Therapeutic Interventions/Progress Updates:    OT intervention with focus on standing balance and functional amb with RW and SBQC. Pt states he uses a cane similar to [facility]. Amb with SBQC in ADL apartment with CGA. Amb in kitchen with RW into hallway and back to room. Focus on activity toleance, functional amb with RW and SBQC, standing balance, and safety awarness to increase independence with BADLs.   Therapy Documentation Precautions:  Precautions Precautions: Fall Precaution Comments: bradycardia Restrictions Weight Bearing Restrictions: No General:   Vital Signs:  Pain: Pt denies pain this afternoon  Therapy/Group: Individual Therapy  [name] 12/28/2020, 12:27 PM

## 2020-12-29 ENCOUNTER — Inpatient Hospital Stay (HOSPITAL_COMMUNITY): Payer: Medicare Other | Admitting: Physical Therapy

## 2020-12-29 ENCOUNTER — Inpatient Hospital Stay (HOSPITAL_COMMUNITY): Payer: Medicare Other

## 2020-12-29 LAB — CBC WITH DIFFERENTIAL/PLATELET
Abs Immature Granulocytes: 0.05 10*3/uL (ref 0.00–0.07)
Basophils Absolute: 0.1 10*3/uL (ref 0.0–0.1)
Basophils Relative: 1 %
Eosinophils Absolute: 0.3 10*3/uL (ref 0.0–0.5)
Eosinophils Relative: 3 %
HCT: 40.4 % (ref 39.0–52.0)
Hemoglobin: 12.9 g/dL — ABNORMAL LOW (ref 13.0–17.0)
Immature Granulocytes: 1 %
Lymphocytes Relative: 18 %
Lymphs Abs: 1.6 10*3/uL (ref 0.7–4.0)
MCH: 28.6 pg (ref 26.0–34.0)
MCHC: 31.9 g/dL (ref 30.0–36.0)
MCV: 89.6 fL (ref 80.0–100.0)
Monocytes Absolute: 1.6 10*3/uL — ABNORMAL HIGH (ref 0.1–1.0)
Monocytes Relative: 17 %
Neutro Abs: 5.4 10*3/uL (ref 1.7–7.7)
Neutrophils Relative %: 60 %
Platelets: 387 10*3/uL (ref 150–400)
RBC: 4.51 MIL/uL (ref 4.22–5.81)
RDW: 13.9 % (ref 11.5–15.5)
WBC: 9 10*3/uL (ref 4.0–10.5)
nRBC: 0 % (ref 0.0–0.2)

## 2020-12-29 LAB — BASIC METABOLIC PANEL
Anion gap: 10 (ref 5–15)
BUN: 13 mg/dL (ref 8–23)
CO2: 22 mmol/L (ref 22–32)
Calcium: 10.2 mg/dL (ref 8.9–10.3)
Chloride: 97 mmol/L — ABNORMAL LOW (ref 98–111)
Creatinine, Ser: 1.2 mg/dL (ref 0.61–1.24)
GFR, Estimated: >60 mL/min (ref >60)
Glucose, Bld: 103 mg/dL — ABNORMAL HIGH (ref 70–99)
Potassium: 4.2 mmol/L (ref 3.5–5.1)
Sodium: 129 mmol/L — ABNORMAL LOW (ref 135–145)

## 2020-12-29 MED ORDER — PHENOL 1.4 % MT LIQD
1.0000 | OROMUCOSAL | Status: DC | PRN
  Administered 2020-12-29: 1 via OROMUCOSAL
  Filled 2020-12-29: qty 177

## 2020-12-29 MED ORDER — GUAIFENESIN-DM 100-10 MG/5ML PO SYRP
5.0000 mL | ORAL_SOLUTION | ORAL | Status: DC | PRN
  Administered 2020-12-29: 5 mL via ORAL
  Filled 2020-12-29: qty 10

## 2020-12-29 MED ORDER — ALBUTEROL SULFATE HFA 108 (90 BASE) MCG/ACT IN AERS
2.0000 | INHALATION_SPRAY | Freq: Four times a day (QID) | RESPIRATORY_TRACT | Status: DC
  Administered 2020-12-29 (×3): 2 via RESPIRATORY_TRACT
  Filled 2020-12-29: qty 6.7

## 2020-12-29 MED ORDER — BUSPIRONE HCL 5 MG PO TABS
7.5000 mg | ORAL_TABLET | Freq: Three times a day (TID) | ORAL | Status: DC
  Administered 2020-12-29 – 2021-01-01 (×10): 7.5 mg via ORAL
  Filled 2020-12-29 (×12): qty 2

## 2020-12-29 NOTE — Progress Notes (Signed)
Physical Therapy Session Note  Patient Details  Name: Evan Kirk MRN: [id number] Date of Birth: 05/13/1941  Today's Date: 12/29/2020 PT Individual Time: 1000-1100; [id number] PT Individual Time Calculation (min): 60 min and 60 min  Short Term Goals: Week 1:  PT Short Term Goal 1 (Week 1): Patient will complete supine <> sit with MinA PT Short Term Goal 2 (Week 1): Patient will be able to complete basic transfers with CGA and LRAD PT Short Term Goal 3 (Week 1): Patient will ambulate >3ft with MinA and LRAD  Skilled Therapeutic Interventions/Progress Updates:    Session 1: Pt received seated in recliner in room, agreeable to PT session. No complaints of pain. Sit to stand with CGA to RW throughout session. Ambulation up to 200 ft with RW and CGA throughout session, cues for upright posture and decreased shoulder elevation during gait. Ascend/descend 12 x 6" steps with 2 handrails and CGA for balance, step-to gait pattern. Sidesteps L/R 3 x 20 ft with RW and CGA for balance, visual and verbal cues for decreased path deviation for posture. Sit to/from supine on real 26" height bed at Supervision level to simulate home environment. Utilized 6" step under BLE as pt reports his bed at home is shorter than bed in therapy apartment. Sit to stand from low, pliable surface of couch with min A to RW, from recliner with CGA to RW. Static standing with no UE support performing ball toss with gradually narrower BOS, CGA overall with one instance of LOB anteriorly requiring min A to correct. Pt left seated in recliner in room with needs in reach, chair alarm in place at end of session.  Session 2: Pt received seated on toilet handed off from NT. Pt is independent for pericare, CGA for sit to stand transfer from toilet to RW then independent with clothing management. Ambulation up to 200 ft with RW at [facility] level this session. Session focus on static standing balance performing Bi[employer]x limits of stability level  1 performing multidirectional weight shifts following visual cues with CGA overall. Pt initially performs with use of BUE and scores 67%. With use of alt UE he scores 51% and 48%. Pt then progresses to no UE support and initially scores 16% and 19% then progresses to scoring 33% x 2. With increased practice pt shows better understanding of how to perform multidirectional weight shifting outside BOS while maintaining standing balance. Standing alt L/R 4" step taps with no UE support and min A x 2 for safety. Pt exhibits one near LOB requiring assist x 2 to correct. Pt exhibits decreased balance when standing on LLE as compared to RLE. Ambulation through obstacle course navigating through cones with RW and CGA. Pt returned to room at end of session, left seated in recliner in room with needs in reach, chair alarm in place.  Therapy Documentation Precautions:  Precautions Precautions: Fall Precaution Comments: bradycardia Restrictions Weight Bearing Restrictions: No   Therapy/Group: Individual Therapy   Excell Seltzer, PT, DPT  12/29/2020, 12:12 PM

## 2020-12-29 NOTE — Progress Notes (Signed)
Patient ID: Evan Kirk, male   DOB: 17-Sep-1941, 80 y.o.   MRN: [id number]  SW met with pt in room to provide updates from team conference, and d/c date 01/01/21. Pt stated his son was in the room and would return.  3:35pm- SW called pt son Chet to provide above updates, and pt need for 24/7 care/supervision. He will be here for family edu on Thursday (1/13) 10am-12pm. SW informed once there are more updates on d/c recommendations SW will provide.   Loralee Pacas, MSW, [place] Office: [phone] Cell: [phone] Fax: [phone]

## 2020-12-29 NOTE — Plan of Care (Signed)
  Problem: RH Ambulation Goal: LTG Patient will ambulate in controlled environment (PT) Description: LTG: Patient will ambulate in a controlled environment, # of feet with assistance (PT). Flowsheets (Taken 12/29/2020 1717) LTG: Pt will ambulate in controlled environ  assist needed:: (upgrade due to progress) Supervision/Verbal cueing LTG: Ambulation distance in controlled environment: 150 ft Note: Upgrade due to progress Goal: LTG Patient will ambulate in home environment (PT) Description: LTG: Patient will ambulate in home environment, # of feet with assistance (PT). Flowsheets (Taken 12/29/2020 1717) LTG: Pt will ambulate in home environ  assist needed:: (upgrade due to progress) Supervision/Verbal cueing LTG: Ambulation distance in home environment: 75 ft Note: Upgrade due to progress   Problem: RH Wheelchair Mobility Goal: LTG Patient will propel w/c in controlled environment (PT) Description: LTG: Patient will propel wheelchair in controlled environment, # of feet with assist (PT) Flowsheets (Taken 12/29/2020 1717) LTG: Pt will propel w/c in controlled environ  assist needed:: (d/c due to progress) -- Note: D/c due to progress Goal: LTG Patient will propel w/c in home environment (PT) Description: LTG: Patient will propel wheelchair in home environment, # of feet with assistance (PT). Flowsheets (Taken 12/29/2020 1717) LTG: Pt will propel w/c in home environ  assist needed:: (d/c due to progress) -- Note: D/c due to progress   Problem: RH Stairs Goal: LTG Patient will ambulate up and down stairs w/assist (PT) Description: LTG: Patient will ambulate up and down # of stairs with assistance (PT) Flowsheets (Taken 12/29/2020 1717) LTG: Pt will ambulate up/down stairs assist needed:: (upgrade due to progress) Supervision/Verbal cueing LTG: Pt will  ambulate up and down number of stairs: 12 Note: Upgrade due to progress

## 2020-12-29 NOTE — Progress Notes (Signed)
Occupational Therapy Session Note  Patient Details  Name: Evan Kirk MRN: [id number] Date of Birth: July 13, 1941  Today's Date: 12/29/2020 OT Individual Time: 1130-1200 OT Individual Time Calculation (min): 30 min    Short Term Goals: Week 1:  OT Short Term Goal 1 (Week 1): STG = LTG due to estimated LOS - overall set up/supervision level for functional transfers and self care with assistive devices/DME  Skilled Therapeutic Interventions/Progress Updates:    Pt resting in recliner upon arrival. Pt requested to use toilet and amb with RW to bathroom with CGA. Toileting with supervision. Pt returned to room and stood at sink to wash hands. Pt amb with RW to ortho gym and engaged in standing activities with horseshoes. Pt retrieved horseshoes from floor using reacher and placing on cross bar of RW. No LOB during activity. Pt amb with RW back to room and returned to recliner. Pt remained in recliner with all needs within reach and seat alarm activated. OT intervention with focus on functional amb with RW and dynamic standing balance to increase independence with BADLs.   Therapy Documentation Precautions:  Precautions Precautions: Fall Precaution Comments: bradycardia Restrictions Weight Bearing Restrictions: No  Pain: Pain Assessment Pain Scale: 0-10 Pain Score: 0-No pain   Therapy/Group: Individual Therapy  [name] 12/29/2020, 12:16 PM

## 2020-12-29 NOTE — Progress Notes (Signed)
Occupational Therapy Session Note  Patient Details  Name: ELICEO GLADU MRN: [id number] Date of Birth: [birth year]  Today's Date: 12/29/2020 OT Individual Time: 1761-6073 OT Individual Time Calculation (min): 55 min    Short Term Goals: Week 1:  OT Short Term Goal 1 (Week 1): STG = LTG due to estimated LOS - overall set up/supervision level for functional transfers and self care with assistive devices/DME  Skilled Therapeutic Interventions/Progress Updates:    Pt resting in recliner upon arrival. Pt declined bathing and grooming but wanted to don paper scrubs. Pt able to thread BLE into pants and pull over hips with CGA when standing. Pt required assistance with donning Ted hose. Pt donned shoes without assistance. Pt amb with RW to ortho gym and initially engaged in therex on NuStep (7 mins at level 5). Pt amb with RW to main gym and engaged in standing activity on Airex cushion-retrieving and replacing cards on card board. Pt required CGA for standing balance on Airex. No LOB noted. Pt amb with RW back to room and returned to recliner. Pt remained in recliner with all needs within reach and seat alarm activated.   Therapy Documentation Precautions:  Precautions Precautions: Fall Precaution Comments: bradycardia Restrictions Weight Bearing Restrictions: No  Pain:  Pt denies pain this morning.   Therapy/Group: Individual Therapy  [name] 12/29/2020, 9:01 AM

## 2020-12-29 NOTE — Progress Notes (Signed)
[place] PHYSICAL MEDICINE & REHABILITATION PROGRESS NOTE   Subjective/Complaints:  Fought anxiety all night per pt- since felt like couldn't breathe- slept poorly.  Has had PTSD for years, but anxiety made him feel like he couldn't breathe.   Also had cough x 5 days- getting worse; tessalon hasn't helped- wants to try robitussin- also having sore throat- asked for something- will try throat spray.   Asked him to ask nursing to check his O2 sats o/n if occurs again.    ROS:   Pt denies current SOB, abd pain, CP, N/V/C/D, and vision changes- has (+) cough   Objective:   DG CHEST PORT 1 VIEW  Result Date: 12/29/2020 CLINICAL DATA:  Cough.  Respiratory congestion EXAM: PORTABLE CHEST 1 VIEW COMPARISON:  12/19/2020 FINDINGS: Midline trachea. Mild cardiomegaly. Atherosclerosis in the transverse aorta. No pleural effusion or pneumothorax. Clear right lung. Development of lateral left base mild airspace disease. IMPRESSION: Developing lateral left lower lobe airspace disease, atelectasis or infection/aspiration. Aortic Atherosclerosis (ICD10-I70.0). Electronically Signed   By: Abigail Miyamoto M.D.   On: 12/29/2020 09:30   No results for input(s): WBC, HGB, HCT, PLT in the last 72 hours. No results for input(s): NA, K, CL, CO2, GLUCOSE, BUN, CREATININE, CALCIUM in the last 72 hours.  Intake/Output Summary (Last 24 hours) at 12/29/2020 0953 Last data filed at 12/29/2020 0730 Gross per 24 hour  Intake 960 ml  Output 1600 ml  Net -640 ml     Pressure Injury 12/23/20 Coccyx Medial Stage 1 -  Intact skin with non-blanchable redness of a localized area usually over a bony prominence. (Active)  12/23/20 1603  Location: Coccyx  Location Orientation: Medial  Staging: Stage 1 -  Intact skin with non-blanchable redness of a localized area usually over a bony prominence.  Wound Description (Comments):   Present on Admission: Yes    Physical Exam: Vital Signs Blood pressure (!) 132/56, pulse  67, temperature 98.2 F (36.8 C), resp. rate 16, height 5\' 6"  (1.676 m), weight 98.7 kg, SpO2 98 %.  Constitutional: No distress . Sitting up in bedside chair, tearful at times, coughing a lot, NAD HEENT: EOMI, oral membranes moist- no sig erythema of oropharynx Neck: supple Cardiovascular: RRR Respiratory/Chest: a little coarse throughout- decreased breath sounds L lower lobe- otherwise no W/R/R GI/Abdomen: Soft, NT, ND, (+)BS  Ext: no clubbing, cyanosis, or edema Psych: tearful at times- but appropriate Musculoskeletal:     Cervical back: Normal range of motion and neck supple.     Comments: no edema/ tenderness in exts Skin:    General: Skin is warm and dry. Has a very red spot on his penis- just behind the head of penis- not open, but looks raw/angry; Also has a little blanchable redness between buttocks cheeks- I don't see anything on coccyx like described.   Neurological: Ox3- off CPAP this AM for first time.  Motor: Bilateral upper extremities: 5/5 proximal distal Right lower extremity: Hip flexion, knee extension 2/5, dorsiflexion 4/5 Left lower extremity: Hip flexion, knee extension 2+/5 plantarflexion 4/5 Left facial weakness ongoing Psychiatric:  appropriate    Assessment/Plan: 1. Functional deficits which require 3+ hours per day of interdisciplinary therapy in a comprehensive inpatient rehab setting.  Physiatrist is providing close team supervision and 24 hour management of active medical problems listed below.  Physiatrist and rehab team continue to assess barriers to discharge/monitor patient progress toward functional and medical goals  Care Tool:  Bathing    Body parts bathed by patient:  Right arm,Left arm,Chest,Abdomen,Front perineal area,Buttocks,Right upper leg,Left upper leg,Right lower leg,Left lower leg,Face   Body parts bathed by helper: Buttocks,Right lower leg,Left lower leg     Bathing assist Assist Level: Minimal Assistance - Patient > 75%      Upper Body Dressing/Undressing Upper body dressing   What is the patient wearing?: Pull over shirt    Upper body assist Assist Level: Set up assist    Lower Body Dressing/Undressing Lower body dressing      What is the patient wearing?: Pants     Lower body assist Assist for lower body dressing: Supervision/Verbal cueing     Toileting Toileting    Toileting assist Assist for toileting: Minimal Assistance - Patient > 75% Assistive Device Comment:  (urinal)   Transfers Chair/bed transfer  Transfers assist     Chair/bed transfer assist level: Contact Guard/Touching assist Chair/bed transfer assistive device: Geologist, engineeringWalker   Locomotion Ambulation   Ambulation assist      Assist level: Contact Guard/Touching assist Assistive device: Walker-rolling Max distance: 200'   Walk 10 feet activity   Assist     Assist level: Contact Guard/Touching assist Assistive device: Walker-rolling   Walk 50 feet activity   Assist Walk 50 feet with 2 turns activity did not occur: Safety/medical concerns  Assist level: Contact Guard/Touching assist Assistive device: Walker-rolling    Walk 150 feet activity   Assist Walk 150 feet activity did not occur: Safety/medical concerns  Assist level: Contact Guard/Touching assist Assistive device: Walker-rolling    Walk 10 feet on uneven surface  activity   Assist Walk 10 feet on uneven surfaces activity did not occur: Safety/medical concerns         Wheelchair     Assist Will patient use wheelchair at discharge?: Yes Type of Wheelchair: Manual    Wheelchair assist level: Supervision/Verbal cueing Max wheelchair distance: 150    Wheelchair 50 feet with 2 turns activity    Assist        Assist Level: Supervision/Verbal cueing   Wheelchair 150 feet activity     Assist      Assist Level: Supervision/Verbal cueing   Blood pressure (!) 132/56, pulse 67, temperature 98.2 F (36.8 C), resp. rate 16,  height 5\' 6"  (1.676 m), weight 98.7 kg, SpO2 98 %.  Medical Problem List and Plan: 1. 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             -patient may shower             -ELOS/Goals: 14-17 days/Supervision/min a             continue therapies 2.  Antithrombotics: -DVT/anticoagulation: Eliquis             -antiplatelet therapy: N/A 3. Pain Management: Lyrica 25 mg twice daily  1/6- pain is chronic, but overall controlled- con't regimen 4. Mood: Provide emotional support.               -antipsychotic agents: N/A 5. Neuropsych: This patient is capable of making decisions on his own behalf. 6. Skin/Wound Care: Routine skin checks  1/6- Possible pressure ulcer- will check skin  1/7- has stage II on top of penis behind head and some blanchable redness between buttocks/cheeks- con't creams that nursing is using per pt.  7. Fluids/Electrolytes/Nutrition: Routine in and outs               8.  Atrial fibrillation.  Continue Eliquis.  Cardiac rate controlled  Monitor with increased activity  1/6- rate bradycardic this AM- will monitor closely- might need to titrate meds  1/7-8- Rate 70s   con't regimen 9.  OSA.  CPAP 10.  Active Recurrent renal carcinoma.  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 Hyperlipidemia: Lipitor 12. 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  14. Hyponatremia             Sodium 134 on 1/3, CMP ordered for tomorrow   15. Prediabetes?  1/6- BG 106 this AM, but A1c is 5.5- will monitor for stress rxn/hyperglycemia 16. Constipation-  1/7- no BM for 5+ days- failed sorbitol and soap suds enema- will give Mg citrate in addition to miralax  And senokot already ordered and suppository  if needed afterwards. Is ordered.    1/8 pt with multiple bm's last night   -continue mtc regimen  1/10- asking for Mg citrate again- will order after therapy. LBM Thursday except for "squirts".  17. Coughing? L lateral lobe infiltrate?  1/10- will give tessalon perles TID prn for cough  1/11- Will d/c tessalon and start Throat spray and robittussin prn. Will give albuterol for 3 days then prn; and CXR done- has L lateral lobe infiltrate? Will check CBC and BMP today to verify if has infection.  18. Anxiety?  1/11- will try Buspar 7.5 mg TID for anxiety- also if feels SOB, asked him to get nursing to check O2 sats.      LOS: 6 days A FACE TO FACE EVALUATION WAS PERFORMED  Belvin Gauss 12/29/2020, 9:53 AM

## 2020-12-30 ENCOUNTER — Inpatient Hospital Stay (HOSPITAL_COMMUNITY): Payer: Medicare Other | Admitting: Physical Therapy

## 2020-12-30 ENCOUNTER — Inpatient Hospital Stay (HOSPITAL_COMMUNITY): Payer: Medicare Other

## 2020-12-30 MED ORDER — ALBUTEROL SULFATE HFA 108 (90 BASE) MCG/ACT IN AERS
2.0000 | INHALATION_SPRAY | RESPIRATORY_TRACT | Status: DC | PRN
  Filled 2020-12-30: qty 6.7

## 2020-12-30 NOTE — Progress Notes (Signed)
Physical Therapy Session Note  Patient Details  Name: Evan Kirk MRN: [id number] Date of Birth: 1941/07/29  Today's Date: 12/30/2020 PT Individual Time: 1315-1415 PT Individual Time Calculation (min): 60 min   Short Term Goals: Week 1:  PT Short Term Goal 1 (Week 1): Patient will complete supine <> sit with MinA PT Short Term Goal 2 (Week 1): Patient will be able to complete basic transfers with CGA and LRAD PT Short Term Goal 3 (Week 1): Patient will ambulate >76ft with MinA and LRAD  Skilled Therapeutic Interventions/Progress Updates:    Pt received while finishing lunch, 15 minutes missed so pt could finish meal. Pt states he is still not sleeping well due to his sore throat. No further complaints of pain other than left knee with mobility and brief mention of back pain. Sit to stand transfer CGA with RW. He was able to ambulate 200 ft with CGA and RW, reported some weakness in his shoulders and that he felt more tired after the walk than previous days. Pt was able to transfer to nustep with CGA and RW. Pt tolerated 11 minutes at level 5 with BIL UE and LE, stating he felt better after exercising. Pt RPE of 15 at minute 5. Pt demonstrated HR=93 and O2=97 at completion of activity. Pt performed standing cornhole with CGA and no UE support, reaching to both sides for beanbags for 10 minutes with one seated rest break. Pt tolerated session well, was able to walk back to room despite fatigue and stated he felt tired but better since moving. Pt left seated in recliner with chair alarm in place with all needs within reach.   Therapy Documentation Precautions:  Precautions Precautions: Fall Precaution Comments: bradycardia Restrictions Weight Bearing Restrictions: No General: PT Amount of Missed Time (min): 15 Minutes PT Missed Treatment Reason: Other (Comment) (Pt eating lunch)    Therapy/Group: Individual Therapy  Sharen Counter, SPT 12/30/2020, 5:22 PM

## 2020-12-30 NOTE — Progress Notes (Signed)
Physical Therapy Note  Patient Details  Name: Evan Kirk MRN: [id number] Date of Birth: August 11, 1941 Today's Date: 12/30/2020    Attempted to see patient for scheduled therapy session. Pt declines participation this AM due to fatigue and not sleeping overnight. Will attempt to follow up this PM after patient rests. Pt missed 60 min of scheduled therapy session due to fatigue.    Excell Seltzer, PT, DPT  12/30/2020, 12:26 PM

## 2020-12-30 NOTE — Progress Notes (Signed)
[place] PHYSICAL MEDICINE & REHABILITATION PROGRESS NOTE   Subjective/Complaints:  Pt sleeping- woke enough to tell me he knew I was the doctor- , but "go away- I'm asleep".  ROS:  Pt asleep- unable to answer just told me to "go away".   Objective:   DG CHEST PORT 1 VIEW  Result Date: 12/29/2020 CLINICAL DATA:  Cough.  Respiratory congestion EXAM: PORTABLE CHEST 1 VIEW COMPARISON:  12/19/2020 FINDINGS: Midline trachea. Mild cardiomegaly. Atherosclerosis in the transverse aorta. No pleural effusion or pneumothorax. Clear right lung. Development of lateral left base mild airspace disease. IMPRESSION: Developing lateral left lower lobe airspace disease, atelectasis or infection/aspiration. Aortic Atherosclerosis (ICD10-I70.0). Electronically Signed   By: Abigail Miyamoto M.D.   On: 12/29/2020 09:30   Recent Labs    12/29/20 1100  WBC 9.0  HGB 12.9*  HCT 40.4  PLT 387   Recent Labs    12/29/20 1100  NA 129*  K 4.2  CL 97*  CO2 22  GLUCOSE 103*  BUN 13  CREATININE 1.20  CALCIUM 10.2    Intake/Output Summary (Last 24 hours) at 12/30/2020 0953 Last data filed at 12/30/2020 0429 Gross per 24 hour  Intake 960 ml  Output 3101 ml  Net -2141 ml     Pressure Injury 12/23/20 Coccyx Medial Stage 1 -  Intact skin with non-blanchable redness of a localized area usually over a bony prominence. (Active)  12/23/20 1603  Location: Coccyx  Location Orientation: Medial  Staging: Stage 1 -  Intact skin with non-blanchable redness of a localized area usually over a bony prominence.  Wound Description (Comments):   Present on Admission: Yes    Physical Exam: Vital Signs Blood pressure (!) 106/45, pulse (!) 48, temperature 97.9 F (36.6 C), resp. rate 20, height 5\' 6"  (1.676 m), weight 98.8 kg, SpO2 96 %.  Constitutional: asleep with CPAP in place- full mask, NAD HEENT: EOMI, oral membranes moist- no sig erythema of oropharynx Neck: supple Cardiovascular: bradycardic- but  irregular Respiratory/Chest: decreased L lower lobe, but less coarse, no wheezing/rhonchi GI/Abdomen: Soft, NT, ND, (+)BS  Ext: no clubbing, cyanosis, or edema Psych: asleep; a little grumpy Musculoskeletal:     Cervical back: Normal range of motion and neck supple.     Comments: no edema/ tenderness in exts Skin:    General: Skin is warm and dry. Has a very red spot on his penis- just behind the head of penis- not open, but looks raw/angry; Also has a little blanchable redness between buttocks cheeks- I don't see anything on coccyx like described.   Neurological: Ox3- off CPAP this AM for first time.  Motor: Bilateral upper extremities: 5/5 proximal distal Right lower extremity: Hip flexion, knee extension 2/5, dorsiflexion 4/5 Left lower extremity: Hip flexion, knee extension 2+/5 plantarflexion 4/5 Left facial weakness ongoing Psychiatric:  appropriate    Assessment/Plan: 1. Functional deficits which require 3+ hours per day of interdisciplinary therapy in a comprehensive inpatient rehab setting.  Physiatrist is providing close team supervision and 24 hour management of active medical problems listed below.  Physiatrist and rehab team continue to assess barriers to discharge/monitor patient progress toward functional and medical goals  Care Tool:  Bathing    Body parts bathed by patient: Right arm,Left arm,Chest,Abdomen,Front perineal area,Buttocks,Right upper leg,Left upper leg,Right lower leg,Left lower leg,Face   Body parts bathed by helper: Buttocks,Right lower leg,Left lower leg     Bathing assist Assist Level: Minimal Assistance - Patient > 75%     Upper  Body Dressing/Undressing Upper body dressing   What is the patient wearing?: Pull over shirt    Upper body assist Assist Level: Set up assist    Lower Body Dressing/Undressing Lower body dressing      What is the patient wearing?: Pants     Lower body assist Assist for lower body dressing:  Supervision/Verbal cueing     Toileting Toileting    Toileting assist Assist for toileting: Supervision/Verbal cueing Assistive Device Comment:  (urinal)   Transfers Chair/bed transfer  Transfers assist     Chair/bed transfer assist level: Contact Guard/Touching assist Chair/bed transfer assistive device: Geologist, engineering   Ambulation assist      Assist level: Contact Guard/Touching assist Assistive device: Walker-rolling Max distance: 200'   Walk 10 feet activity   Assist     Assist level: Contact Guard/Touching assist Assistive device: Walker-rolling   Walk 50 feet activity   Assist Walk 50 feet with 2 turns activity did not occur: Safety/medical concerns  Assist level: Contact Guard/Touching assist Assistive device: Walker-rolling    Walk 150 feet activity   Assist Walk 150 feet activity did not occur: Safety/medical concerns  Assist level: Contact Guard/Touching assist Assistive device: Walker-rolling    Walk 10 feet on uneven surface  activity   Assist Walk 10 feet on uneven surfaces activity did not occur: Safety/medical concerns         Wheelchair     Assist Will patient use wheelchair at discharge?: Yes Type of Wheelchair: Manual    Wheelchair assist level: Supervision/Verbal cueing Max wheelchair distance: 150    Wheelchair 50 feet with 2 turns activity    Assist        Assist Level: Supervision/Verbal cueing   Wheelchair 150 feet activity     Assist      Assist Level: Supervision/Verbal cueing   Blood pressure (!) 106/45, pulse (!) 48, temperature 97.9 F (36.6 C), resp. rate 20, height 5\' 6"  (1.676 m), weight 98.8 kg, SpO2 96 %.  Medical Problem List and Plan: 1. 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             -patient may shower             -ELOS/Goals: 14-17 days/Supervision/min a             continue therapies 2.   Antithrombotics: -DVT/anticoagulation: Eliquis             -antiplatelet therapy: N/A 3. Pain Management: Lyrica 25 mg twice daily  1/6- pain is chronic, but overall controlled- con't regimen  1/12- pain controlled- con't regimen 4. Mood: Provide emotional support.               -antipsychotic agents: N/A 5. Neuropsych: This patient is capable of making decisions on his own behalf. 6. Skin/Wound Care: Routine skin checks  1/6- Possible pressure ulcer- will check skin  1/7- has stage II on top of penis behind head and some blanchable redness between buttocks/cheeks- con't creams that nursing is using per pt.  7. Fluids/Electrolytes/Nutrition: Routine in and outs               8.  Atrial fibrillation.  Continue Eliquis.  Cardiac rate controlled             Monitor with increased activity  1/6- rate bradycardic this AM- will monitor closely- might need to titrate meds  1/7-8- Rate 70s   con't regimen  1/12- rate slightly low-  48-55- but usually bumps right back up- was sleeping- con't regimen 9.  OSA.  CPAP 10.  Active Recurrent renal carcinoma.  Followed by oncology services [facility] as well at [facility]             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 Hyperlipidemia: Lipitor 12. 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  14. Hyponatremia             Sodium 134 on 1/3, CMP ordered for tomorrow    1/12- Na 129- will recheck Friday- and see if improving- Cr also slightly up to 1.20- will monitor 15. Prediabetes?  1/6- BG 106 this AM, but A1c is 5.5- will monitor for stress rxn/hyperglycemia 16. Constipation-  1/7- no BM for 5+ days- failed sorbitol and soap suds enema- will give Mg citrate in addition to miralax  And senokot already ordered and suppository if needed afterwards. Is ordered.    1/8 pt with  multiple bm's last night   -continue mtc regimen  1/10- asking for Mg citrate again- will order after therapy. LBM Thursday except for "squirts".  17. Coughing? L lateral lobe infiltrate?  1/10- will give tessalon perles TID prn for cough  1/11- Will d/c tessalon and start Throat spray and robittussin prn. Will give albuterol for 3 days then prn; and CXR done- has L lateral lobe infiltrate? Will check CBC and BMP today to verify if has infection.  1/12- WBC is normal at 9.0- is stable- will wait on ABX. Per nursing, coughing is less with Robitussin.   18. Anxiety?  1/11- will try Buspar 7.5 mg TID for anxiety- also if feels SOB, asked him to get nursing to check O2 sats.      LOS: 7 days A FACE TO FACE EVALUATION WAS PERFORMED  Evan Kirk 12/30/2020, 9:53 AM

## 2020-12-30 NOTE — Progress Notes (Addendum)
Patient ID: Evan Kirk, male   DOB: Jun 03, 1941, 80 y.o.   MRN: [id number]  SW provided pt with HHA list. Pt stated no HHA preference.   SW sent HHPT/OT referral to [facility] and waiting on follow-up. *referral declined due to [facility] being on hold for referrals this week.   SW sent referral to [facility]. SW waiting on follow-up.  *referral accepted. SW informed pt prefers to use his [employer] benefits if possible for services.   SW returned phone call and left message for Haena [phone] ext [phone]) to inform on above about HHA. SW requested aide service. On order faxed to [facility]- Dr. Ovidio Hanger PCP ([phone] ext. [phone]/f:[phone]) requested counseling/neurpsych evaluation due to nightmares reported.   Loralee Pacas, MSW, [place] Office: [phone] Cell: [phone] Fax: [phone]

## 2020-12-30 NOTE — Discharge Summary (Signed)
Physician Discharge Summary  Patient ID: Evan Kirk MRN: [id number] DOB/AGE: July 28, 1941 80 y.o.  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 Multiplanar CT image reconstructions and MIPs were obtained to evaluate the vascular anatomy. Carotid stenosis measurements (when applicable) are obtained utilizing NASCET criteria, using the distal internal carotid diameter as the denominator. CONTRAST:  23mL OMNIPAQUE IOHEXOL 350 MG/ML SOLN COMPARISON:  Head CT 12/15/2020 FINDINGS: CT HEAD FINDINGS Brain: There is no mass, hemorrhage or extra-axial collection. There is generalized atrophy without lobar predilection. There is no acute or chronic infarction. The brain parenchyma is normal. Skull: The visualized skull base, calvarium and extracranial soft tissues are normal. Sinuses/Orbits: No fluid levels or advanced mucosal thickening of the visualized paranasal sinuses. No mastoid or middle ear effusion. The orbits are normal. CTA NECK FINDINGS SKELETON: There is no bony spinal canal stenosis. No lytic or blastic lesion. OTHER NECK: Normal pharynx, larynx and major salivary glands. No cervical lymphadenopathy. Unremarkable thyroid gland. UPPER CHEST: No pneumothorax or pleural effusion. No nodules or masses. AORTIC ARCH: There is calcific atherosclerosis of the aortic  arch. There is no aneurysm, dissection or hemodynamically significant stenosis of the visualized portion of the aorta. Conventional 3 vessel aortic branching pattern. The visualized proximal subclavian arteries are widely patent. RIGHT CAROTID SYSTEM: No dissection, occlusion or aneurysm. Mild atherosclerotic calcification at the carotid bifurcation without hemodynamically significant stenosis. LEFT CAROTID SYSTEM: No dissection, occlusion or aneurysm. Mild atherosclerotic calcification at the carotid bifurcation without hemodynamically significant stenosis. VERTEBRAL ARTERIES: Left dominant configuration. Both origins are clearly patent. There is no dissection, occlusion or flow-limiting stenosis to the skull base (V1-V3 segments). CTA HEAD FINDINGS POSTERIOR CIRCULATION: --Vertebral arteries: Normal V4 segments. --Inferior cerebellar arteries: Normal. --Basilar artery: Normal. --Superior cerebellar arteries: Normal. --Posterior cerebral arteries (PCA): Normal. ANTERIOR CIRCULATION: --Intracranial internal carotid arteries: Normal. --Anterior cerebral arteries (ACA): Normal. Both A1 segments are present. Patent anterior communicating artery (a-comm). --Middle cerebral arteries (MCA): Normal. VENOUS SINUSES: As permitted by contrast timing, patent. ANATOMIC VARIANTS: None Review of the MIP images confirms the above findings. IMPRESSION: 1. No emergent large vessel occlusion or high-grade stenosis of the head or neck. Aortic Atherosclerosis (ICD10-I70.0). Electronically Signed   By: Ulyses Jarred M.D.   On: 12/16/2020 21:21   CT Head Wo Contrast  Result Date: 12/15/2020 CLINICAL DATA:  weakness EXAM: CT HEAD WITHOUT CONTRAST TECHNIQUE: Contiguous axial images were obtained from the base of the skull through the vertex without intravenous contrast. COMPARISON:  None. FINDINGS: Brain: No evidence of acute territorial infarction, hemorrhage, hydrocephalus,extra-axial collection or mass lesion/mass effect. There is  dilatation the ventricles and sulci consistent with age-related atrophy. Low-attenuation changes in the deep white matter consistent with small vessel ischemia. Vascular: No hyperdense vessel or unexpected calcification. Skull: The skull is intact. No fracture or focal lesion identified. Sinuses/Orbits: The visualized paranasal sinuses and mastoid air cells are clear. The orbits and globes intact. Other: None IMPRESSION: No acute intracranial abnormality. Findings consistent with age  related atrophy and chronic small vessel ischemia Electronically Signed   By: Prudencio Pair M.D.   On: 12/15/2020 21:11   CT ANGIO NECK W OR WO CONTRAST  Result Date: 12/16/2020 CLINICAL DATA:  Weakness EXAM: CT ANGIOGRAPHY HEAD AND NECK TECHNIQUE: Multidetector CT imaging of the head and neck was performed using the standard protocol during bolus administration of intravenous contrast. Multiplanar CT image reconstructions and MIPs were obtained to evaluate the vascular anatomy. Carotid stenosis measurements (when applicable) are obtained utilizing NASCET criteria, using the distal internal carotid diameter as the denominator. CONTRAST:  32mL OMNIPAQUE IOHEXOL 350 MG/ML SOLN COMPARISON:  Head CT 12/15/2020 FINDINGS: CT HEAD FINDINGS Brain: There is no mass, hemorrhage or extra-axial collection. There is generalized atrophy without lobar predilection. There is no acute or chronic infarction. The brain parenchyma is normal. Skull: The visualized skull base, calvarium and extracranial soft tissues are normal. Sinuses/Orbits: No fluid levels or advanced mucosal thickening of the visualized paranasal sinuses. No mastoid or middle ear effusion. The orbits are normal. CTA NECK FINDINGS SKELETON: There is no bony spinal canal stenosis. No lytic or blastic lesion. OTHER NECK: Normal pharynx, larynx and major salivary glands. No cervical lymphadenopathy. Unremarkable thyroid gland. UPPER CHEST: No pneumothorax or pleural effusion. No nodules  or masses. AORTIC ARCH: There is calcific atherosclerosis of the aortic arch. There is no aneurysm, dissection or hemodynamically significant stenosis of the visualized portion of the aorta. Conventional 3 vessel aortic branching pattern. The visualized proximal subclavian arteries are widely patent. RIGHT CAROTID SYSTEM: No dissection, occlusion or aneurysm. Mild atherosclerotic calcification at the carotid bifurcation without hemodynamically significant stenosis. LEFT CAROTID SYSTEM: No dissection, occlusion or aneurysm. Mild atherosclerotic calcification at the carotid bifurcation without hemodynamically significant stenosis. VERTEBRAL ARTERIES: Left dominant configuration. Both origins are clearly patent. There is no dissection, occlusion or flow-limiting stenosis to the skull base (V1-V3 segments). CTA HEAD FINDINGS POSTERIOR CIRCULATION: --Vertebral arteries: Normal V4 segments. --Inferior cerebellar arteries: Normal. --Basilar artery: Normal. --Superior cerebellar arteries: Normal. --Posterior cerebral arteries (PCA): Normal. ANTERIOR CIRCULATION: --Intracranial internal carotid arteries: Normal. --Anterior cerebral arteries (ACA): Normal. Both A1 segments are present. Patent anterior communicating artery (a-comm). --Middle cerebral arteries (MCA): Normal. VENOUS SINUSES: As permitted by contrast timing, patent. ANATOMIC VARIANTS: None Review of the MIP images confirms the above findings. IMPRESSION: 1. No emergent large vessel occlusion or high-grade stenosis of the head or neck. Aortic Atherosclerosis (ICD10-I70.0). Electronically Signed   By: Ulyses Jarred M.D.   On: 12/16/2020 21:21   CT Angio Chest PE W and/or Wo Contrast  Result Date: 12/19/2020 CLINICAL DATA:  High probability of pulmonary embolus. EXAM: CT ANGIOGRAPHY CHEST WITH CONTRAST TECHNIQUE: Multidetector CT imaging of the chest was performed using the standard protocol during bolus administration of intravenous contrast. Multiplanar CT  image reconstructions and MIPs were obtained to evaluate the vascular anatomy. CONTRAST:  31mL OMNIPAQUE IOHEXOL 350 MG/ML SOLN COMPARISON:  December 15, 2020. FINDINGS: Cardiovascular: Satisfactory opacification of the pulmonary arteries to the segmental level. No evidence of pulmonary embolism. Mild cardiomegaly is noted. Coronary artery calcifications are noted. No pericardial effusion. Atherosclerosis of thoracic aorta is noted without aneurysm formation. Mediastinum/Nodes: No enlarged mediastinal, hilar, or axillary lymph nodes. Thyroid gland, trachea, and esophagus demonstrate no significant findings. Lungs/Pleura: Lungs are clear. No pleural effusion or pneumothorax. Upper Abdomen: Cholelithiasis. Musculoskeletal: No chest wall abnormality. No acute or significant osseous findings. Review of the MIP images confirms the above findings. IMPRESSION: 1. No definite evidence of pulmonary embolus. 2. Coronary artery  calcifications are noted suggesting coronary artery disease. 3. Cholelithiasis. 4. Aortic atherosclerosis. Aortic Atherosclerosis (ICD10-I70.0). Electronically Signed   By: Marijo Conception M.D.   On: 12/19/2020 19:38   MR BRAIN WO CONTRAST  Result Date: 12/16/2020 CLINICAL DATA:  Lower extremity weakness.  Renal cell carcinoma. EXAM: MRI HEAD WITHOUT CONTRAST TECHNIQUE: Multiplanar, multiecho pulse sequences of the brain and surrounding structures were obtained without intravenous contrast. COMPARISON:  Brain MRI 07/06/2016 FINDINGS: Brain: There is a small focus of acute ischemia in the superior left hemisphere. Focus of chronic microhemorrhage in the left basal ganglia. No acute hemorrhage. There is multifocal hyperintense T2-weighted signal within the white matter. Generalized volume loss without a clear lobar predilection. The midline structures are normal. Vascular: Major flow voids are preserved. Skull and upper cervical spine: Normal calvarium and skull base. Visualized upper cervical spine  and soft tissues are normal. Unchanged occipital sebaceous cyst. Sinuses/Orbits:No paranasal sinus fluid levels or advanced mucosal thickening. No mastoid or middle ear effusion. Normal orbits. IMPRESSION: 1. Small focus of acute ischemia in the superior left hemisphere. No hemorrhage or mass effect. 2. Findings of chronic microvascular ischemia and generalized volume loss . 3. Assessment for metastatic disease limited without intravenous contrast. Electronically Signed   By: Ulyses Jarred M.D.   On: 12/16/2020 01:01   MR THORACIC SPINE W WO CONTRAST  Result Date: 12/16/2020 CLINICAL DATA:  Renal cell carcinoma. Bilateral lower extremity weakness. EXAM: MRI THORACIC AND LUMBAR SPINE WITHOUT AND WITH CONTRAST TECHNIQUE: Multiplanar and multiecho pulse sequences of the thoracic and lumbar spine were obtained without and with intravenous contrast. CONTRAST:  76mL GADAVIST GADOBUTROL 1 MMOL/ML IV SOLN COMPARISON:  CT thoracic and lumbar spine 12/15/2020 FINDINGS: MRI THORACIC SPINE FINDINGS Alignment:  Physiologic. Vertebrae: There is signal change and mild contrast enhancement at the endplates of T0-2, I0-9 and T10-11. No discrete masslike lesion. Cord:  Normal Paraspinal and other soft tissues: Limited assessment of the lungs. Disc levels: No disc herniation or spinal canal stenosis. MRI LUMBAR SPINE FINDINGS Segmentation:  Standard. Alignment: Grade 1 retrolisthesis at L3-4 and grade 1 anterolisthesis at L4-5. Vertebrae: Multilevel degenerative endplate signal changes. No abnormal contrast enhancement. Conus medullaris: Extends to the L1 level and appears normal. 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 Disc levels: L1-L2: Normal disc space and facet joints. No spinal canal stenosis. No neural foraminal stenosis. L2-L3: Normal disc space and facet joints. No spinal canal stenosis. No  neural foraminal stenosis. L3-L4: Disc space narrowing with right asymmetric disc bulge. No spinal canal stenosis. Moderate right and mild left neural foraminal stenosis. L4-L5: Moderate facet hypertrophy with small disc bulge. No spinal canal stenosis. No neural foraminal stenosis. L5-S1: Normal disc space and facet joints. No spinal canal stenosis. No neural foraminal stenosis. Visualized sacrum: Normal. IMPRESSION: 1. 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 2. No evidence of metastatic disease to the thoracic or lumbar spine. 3. Mild edema and contrast enhancement at the endplates at B3-5, H2-9 and T10-11 without discrete masslike lesion. This is favored to be degenerative. 4. Moderate right L3-4 neural foraminal stenosis. Electronically Signed   By: Ulyses Jarred M.D.   On: 12/16/2020 01:10   MR Lumbar Spine W Wo Contrast  Result Date: 12/16/2020 CLINICAL DATA:  Renal cell carcinoma. Bilateral lower extremity  weakness. EXAM: MRI THORACIC AND LUMBAR SPINE WITHOUT AND WITH CONTRAST TECHNIQUE: Multiplanar and multiecho pulse sequences of the thoracic and lumbar spine were obtained without and with intravenous contrast. CONTRAST:  87mL GADAVIST GADOBUTROL 1 MMOL/ML IV SOLN COMPARISON:  CT thoracic and lumbar spine 12/15/2020 FINDINGS: MRI THORACIC SPINE FINDINGS Alignment:  Physiologic. Vertebrae: There is signal change and mild contrast enhancement at the endplates of D34-534, X33443 and T10-11. No discrete masslike lesion. Cord:  Normal Paraspinal and other soft tissues: Limited assessment of the lungs. Disc levels: No disc herniation or spinal canal stenosis. MRI LUMBAR SPINE FINDINGS Segmentation:  Standard. Alignment: Grade 1 retrolisthesis at L3-4 and grade 1 anterolisthesis at L4-5. Vertebrae: Multilevel degenerative endplate signal changes. No abnormal contrast enhancement. Conus  medullaris: Extends to the L1 level and appears normal. 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 Disc levels: L1-L2: Normal disc space and facet joints. No spinal canal stenosis. No neural foraminal stenosis. L2-L3: Normal disc space and facet joints. No spinal canal stenosis. No neural foraminal stenosis. L3-L4: Disc space narrowing with right asymmetric disc bulge. No spinal canal stenosis. Moderate right and mild left neural foraminal stenosis. L4-L5: Moderate facet hypertrophy with small disc bulge. No spinal canal stenosis. No neural foraminal stenosis. L5-S1: Normal disc space and facet joints. No spinal canal stenosis. No neural foraminal stenosis. Visualized sacrum: Normal. IMPRESSION: 1. 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 2. No evidence of metastatic disease to the thoracic or lumbar spine. 3. Mild edema and contrast enhancement at the endplates at D34-534, X33443 and T10-11 without discrete masslike lesion. This is favored to be degenerative. 4. Moderate right L3-4 neural foraminal stenosis. Electronically Signed   By: Ulyses Jarred M.D.   On: 12/16/2020 01:10   CT CHEST ABDOMEN PELVIS W CONTRAST  Result Date: 12/15/2020 CLINICAL DATA:  Renal cell carcinoma abdominal mass. started Chemo 12/22 sent here by [facility] center for further evaluation EXAM: CT CHEST, ABDOMEN, AND PELVIS WITH CONTRAST TECHNIQUE: Multidetector CT imaging of the chest, abdomen and pelvis was performed following the standard protocol during bolus administration of intravenous contrast. CONTRAST:  158mL OMNIPAQUE IOHEXOL 300 MG/ML  SOLN COMPARISON:  MRI abdomen 07/20/2015, CT abdomen pelvis 07/06/2015. FINDINGS: CT CHEST FINDINGS Cardiovascular: Left atrial enlargement. The remainder of the  heart is normal in caliber. For. No significant pericardial effusion. The thoracic aorta is normal in caliber. At least moderate atherosclerotic plaque of the thoracic aorta. At least moderate three-vessel coronary artery calcifications. The main pulmonary artery is normal in caliber. No central pulmonary embolus. Mediastinum/Nodes: No enlarged mediastinal, hilar, or axillary lymph nodes. Thyroid gland, trachea, and esophagus demonstrate no significant findings. Lungs/Pleura: Bilateral lower lobe subsegmental atelectasis. Lingular atelectasis. No focal consolidation. There is a round solid 4 mm nodule within the left upper lobe. Micronodule within the right upper lobe (5:38). Micronodule within the right lower lobe (5:80). Subpleural micronodule within the right upper lobe (5:64). No pulmonary mass. No pleural effusion. No pneumothorax. Musculoskeletal: Slightly asymmetric gynecomastia, right greater than left. No suspicious lytic or blastic osseous lesions. No acute displaced fracture. Multilevel degenerative changes of the spine. Please see separately dictated CT thoracolumbar spine 12/15/2020. CT ABDOMEN PELVIS FINDINGS Hepatobiliary: No focal liver abnormality. At least 2.8 cm peripherally calcified gallstone within the gallbladder lumen. No associated gallbladder  wall thickening or pericholecystic fluid. No biliary dilatation. Pancreas: No focal lesion. Normal pancreatic contour. No surrounding inflammatory changes. No main pancreatic ductal dilatation. Spleen: Normal in size without focal abnormality. Adrenals/Urinary Tract: No adrenal nodule bilaterally. Status post left nephrectomy. The right kidney enhances homogeneous Lea. No nephrolithiasis. No hydronephrosis. No hydroureter. The urinary bladder is unremarkable. Status post Stomach/Bowel: Stomach is within normal limits. No evidence of small bowel wall thickening or dilatation. Diffuse sigmoid and descending colon diverticulosis this cysts with slightly  increased trace pericolonic fat stranding but no definite bowel wall thickening. The appendix appears normal. Vascular/Lymphatic: No abdominal aorta or iliac aneurysm. Severe calcified and noncalcified atherosclerotic plaque of the aorta and its branches. Suggestion of prior retroperitoneal lymph node dissection. Heterogeneous lobulated 7.6 x 7.7 x 11.9 cm retroperitoneal mass that is noted to be inseparable from the inferior vena cava, third portion of the duodenum, aorta, and origin/proximal inferior mesenteric artery. No pelvic or inguinal lymphadenopathy. The mass is noted to encase the inferior vena cava. Intraluminal extension of the mass not excluded. No findings suggest extension of the mass into the right renal vein or right cardiac atrium. Reproductive: Prostate is unremarkable. Other: Trace simple free fluid within the abdomen pelvis. No intraperitoneal free gas. No organized fluid collection. Musculoskeletal: No abdominal wall hernia or abnormality. No suspicious lytic or blastic osseous lesions. No acute displaced fracture. Multilevel degenerative changes of the spine. Please see separately dictated CT thoracolumbar spine 12/15/2020. IMPRESSION: 1. A 7.6 x 7.7 x 11.9 cm heterogeneous retroperitoneal mass is inseparable from the inferior vena cava, third portion of the duodenum, aorta, and origin/proximal inferior mesenteric artery. The mass is noted to encase the inferior vena cava with intraluminal extension not excluded. 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 2. Left atrial enlargement. 3. Diffuse descending colon and sigmoid diverticulosis with no CT findings of definite acute diverticulitis. 4. Cholelithiasis with no CT findings of acute cholecystitis or choledocholithiasis. 5. Slightly asymmetric gynecomastia, right greater than left. Recommend mammographic evaluation. 6.  Aortic Atherosclerosis (ICD10-I70.0). 7. Please see  separately dictated CT thoracolumbar spine 12/15/2020. Electronically Signed   By: Iven Finn M.D.   On: 12/15/2020 21:37   CT T-SPINE NO CHARGE  Result Date: 12/15/2020 CLINICAL DATA:  Bilateral lower extremity weakness. EXAM: CT Thoracic and Lumbar spine without contrast TECHNIQUE: Multiplanar CT images of the thoracic and lumbar spine were reconstructed from contemporary CT of the Chest, Abdomen, and Pelvis CONTRAST:  No additional contrast administered COMPARISON:  None FINDINGS: CT THORACIC SPINE FINDINGS Alignment: Mild dextroscoliosis. Vertebrae: No acute fracture or focal pathologic process. Paraspinal and other soft tissues: Please see report for concomitant CT of the chest, abdomen and pelvis. Disc levels: There is no spinal canal stenosis. CT LUMBAR SPINE FINDINGS Segmentation: Standard Alignment: Normal Vertebrae: No acute fracture or focal pathologic process. Paraspinal and other soft tissues: Retroperitoneal mass better characterized on concomitant CT of the chest, abdomen and pelvis. Disc levels: There is moderate L4-5 facet arthrosis. No lumbar spinal canal stenosis or neural impingement. IMPRESSION: 1. No acute abnormality of the thoracic or lumbar spine. 2. Retroperitoneal mass better characterized on concomitant CT of the chest, abdomen and pelvis. Electronically Signed   By: Ulyses Jarred M.D.   On: 12/15/2020 21:20   CT L-SPINE NO CHARGE  Result Date: 12/15/2020 CLINICAL DATA:  Bilateral lower extremity weakness. EXAM: CT Thoracic and Lumbar spine without contrast TECHNIQUE: Multiplanar CT images of the thoracic and lumbar  spine were reconstructed from contemporary CT of the Chest, Abdomen, and Pelvis CONTRAST:  No additional contrast administered COMPARISON:  None FINDINGS: CT THORACIC SPINE FINDINGS Alignment: Mild dextroscoliosis. Vertebrae: No acute fracture or focal pathologic process. Paraspinal and other soft tissues: Please see report for concomitant CT of the chest,  abdomen and pelvis. Disc levels: There is no spinal canal stenosis. CT LUMBAR SPINE FINDINGS Segmentation: Standard Alignment: Normal Vertebrae: No acute fracture or focal pathologic process. Paraspinal and other soft tissues: Retroperitoneal mass better characterized on concomitant CT of the chest, abdomen and pelvis. Disc levels: There is moderate L4-5 facet arthrosis. No lumbar spinal canal stenosis or neural impingement. IMPRESSION: 1. No acute abnormality of the thoracic or lumbar spine. 2. Retroperitoneal mass better characterized on concomitant CT of the chest, abdomen and pelvis. Electronically Signed   By: Ulyses Jarred M.D.   On: 12/15/2020 21:20   DG CHEST PORT 1 VIEW  Result Date: 12/29/2020 CLINICAL DATA:  Cough.  Respiratory congestion EXAM: PORTABLE CHEST 1 VIEW COMPARISON:  12/19/2020 FINDINGS: Midline trachea. Mild cardiomegaly. Atherosclerosis in the transverse aorta. No pleural effusion or pneumothorax. Clear right lung. Development of lateral left base mild airspace disease. IMPRESSION: Developing lateral left lower lobe airspace disease, atelectasis or infection/aspiration. Aortic Atherosclerosis (ICD10-I70.0). Electronically Signed   By: Abigail Miyamoto M.D.   On: 12/29/2020 09:30   DG Chest Port 1V same Day  Result Date: 12/19/2020 CLINICAL DATA:  Chest pain EXAM: PORTABLE CHEST 1 VIEW COMPARISON:  07/13/2015 FINDINGS: Heart and mediastinal contours are within normal limits. No focal opacities or effusions. No acute bony abnormality. IMPRESSION: No active disease. Electronically Signed   By: Rolm Baptise M.D.   On: 12/19/2020 11:59   ECHOCARDIOGRAM COMPLETE BUBBLE STUDY  Result Date: 12/16/2020    ECHOCARDIOGRAM REPORT   Patient Name:   AASIN DENHART Date of Exam: 12/16/2020 Medical Rec #:  [id number]         Height:       66.0 in Accession #:    [id number]        Weight:       225.0 lb Date of Birth:  31-Aug-1941         BSA:          2.102 m Patient Age:    46 years          BP:            116/65 mmHg Patient Gender: M                 HR:           73 bpm. Exam Location:  Inpatient Procedure: 2D Echo and Saline Contrast Bubble Study Indications:    Stroke  History:        Patient has prior history of Echocardiogram examinations, most                 recent 08/12/2015. CHF, Arrythmias:Atrial Fibrillation; Risk                 Factors:Hypertension.  Sonographer:    Mikki Santee RDCS (AE) Referring Phys: [place]  1. Left ventricular ejection fraction, by estimation, is 60 to 65%. The left ventricle has normal function. The left ventricle has no regional wall motion abnormalities. There is mild left ventricular hypertrophy. Left ventricular diastolic parameters are indeterminate.  2. Right ventricular systolic function is mildly reduced. The right ventricular size is mildly enlarged.  3. Left atrial size  was severely dilated.  4. The mitral valve is abnormal. Trivial mitral valve regurgitation. No evidence of mitral stenosis. Moderate mitral annular calcification.  5. The aortic valve is tricuspid. There is moderate calcification of the aortic valve. Aortic valve regurgitation is not visualized. Mild aortic valve stenosis.  6. Aortic dilatation noted. There is dilatation of the ascending aorta, measuring 44 mm.  7. Agitated saline contrast bubble study was negative, with no evidence of any interatrial shunt. FINDINGS  Left Ventricle: Left ventricular ejection fraction, by estimation, is 60 to 65%. The left ventricle has normal function. The left ventricle has no regional wall motion abnormalities. The left ventricular internal cavity size was normal in size. There is  mild left ventricular hypertrophy. Left ventricular diastolic parameters are indeterminate. Right Ventricle: The right ventricular size is mildly enlarged. Right vetricular wall thickness was not well visualized. Right ventricular systolic function is mildly reduced. Left Atrium: Left atrial size was  severely dilated. Right Atrium: Right atrial size was normal in size. Pericardium: There is no evidence of pericardial effusion. Mitral Valve: The mitral valve is abnormal. Moderate mitral annular calcification. Trivial mitral valve regurgitation. No evidence of mitral valve stenosis. Tricuspid Valve: The tricuspid valve is normal in structure. Tricuspid valve regurgitation is trivial. Aortic Valve: The aortic valve is tricuspid. There is moderate calcification of the aortic valve. Aortic valve regurgitation is not visualized. Mild aortic stenosis is present. Aortic valve mean gradient measures 8.2 mmHg. Aortic valve peak gradient measures 18.6 mmHg. Aortic valve area, by VTI measures 1.73 cm. Pulmonic Valve: The pulmonic valve was not well visualized. Pulmonic valve regurgitation is not visualized. Aorta: The aortic root is normal in size and structure and aortic dilatation noted. There is dilatation of the ascending aorta, measuring 44 mm. Venous: The inferior vena cava was not well visualized. IAS/Shunts: The interatrial septum was not well visualized. Agitated saline contrast was given intravenously to evaluate for intracardiac shunting. Agitated saline contrast bubble study was negative, with no evidence of any interatrial shunt.  LEFT VENTRICLE PLAX 2D LVIDd:         3.80 cm  Diastology LVIDs:         2.70 cm  LV e' medial:   9.36 cm/s LV PW:         1.30 cm  LV E/e' medial: 0.9 LV IVS:        1.30 cm LVOT diam:     2.50 cm LV SV:         77 LV SV Index:   36 LVOT Area:     4.91 cm  RIGHT VENTRICLE RV S prime:     10.20 cm/s TAPSE (M-mode): 1.1 cm LEFT ATRIUM              Index       RIGHT ATRIUM           Index LA diam:        6.20 cm  2.95 cm/m  RA Area:     16.50 cm LA Vol (A2C):   114.0 ml 54.22 ml/m RA Volume:   37.60 ml  17.88 ml/m LA Vol (A4C):   126.0 ml 59.93 ml/m LA Biplane Vol: 120.0 ml 57.08 ml/m  AORTIC VALVE AV Area (Vmax):    1.62 cm AV Area (Vmean):   1.80 cm AV Area (VTI):     1.73  cm AV Vmax:           215.80 cm/s AV Vmean:  132.200 cm/s AV VTI:            0.443 m AV Peak Grad:      18.6 mmHg AV Mean Grad:      8.2 mmHg LVOT Vmax:         71.10 cm/s LVOT Vmean:        48.400 cm/s LVOT VTI:          0.156 m LVOT/AV VTI ratio: 0.35  AORTA Ao Root diam: 3.70 cm MV E velocity: 8.49 cm/s  TRICUSPID VALVE                           TR Peak grad:   21.2 mmHg                           TR Vmax:        230.00 cm/s                            SHUNTS                           Systemic VTI:  0.16 m                           Systemic Diam: 2.50 cm Oswaldo Milian MD Electronically signed by Oswaldo Milian MD Signature Date/Time: 12/16/2020/11:43:55 AM    Final     Labs:  Basic Metabolic Panel: Recent Labs  Lab 12/29/20 1100 12/31/20 0932  NA 129* 133*  K 4.2 3.9  CL 97* 96*  CO2 22 27  GLUCOSE 103* 110*  BUN 13 15  CREATININE 1.20 1.10  CALCIUM 10.2 9.6    CBC: Recent Labs  Lab 12/29/20 1100 12/31/20 0932  WBC 9.0 7.2  NEUTROABS 5.4 4.5  HGB 12.9* 13.2  HCT 40.4 39.3  MCV 89.6 89.1  PLT 387 373    CBG: No results for input(s): GLUCAP in the last 168 hours.  Family history.  Mother with CVA.  Denies any colon cancer esophageal cancer or rectal cancer  Brief HPI:   Evan Kirk is a 80 y.o. 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  Per chart review lives with spouse 1 level home ramped entrance.  Independent with assistive device.  Wife unable physically to assist.  Resented 12/15/2020 with bilateral lower extremity weakness right greater than left of acute onset.  CT/MRI showed small focus of acute ischemia in the superior left  hemisphere.  No hemorrhage or mass-effect.  CT angiogram of head and neck no emergent large vessel occlusion or stenosis.  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  No evidence of metastatic disease to the thoracic or lumbar spine.  Mild edema and contrast-enhancement at the endplates of 624THL and T10-11 without discrete masslike lesion.  Echocardiogram with ejection fraction of 60 to 65% no wall motion abnormalities.  Admission chemistries unremarkable except sodium 134 glucose 100 hemoglobin 12.7.  Neurology follow-up  maintained on intravenous heparin transition to Eliquis.  Physical medicine rehab consult requested due to patient's lower extremity weakness he was admitted for a comprehensive rehab program.   Hospital Course: DAMILOLA LAPERRIERE was admitted to rehab 12/23/2020 for inpatient therapies to consist of PT, ST and OT at least three hours five days a week. Past admission physiatrist, therapy team and rehab RN have worked together to provide customized collaborative inpatient rehab.  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  He remained on chronic Eliquis noted history of atrial fibrillation cardiac rate controlled.  Pain managed with use of Lyrica twice daily.  Active recurrent renal carcinoma followed by oncology services [facility] as well as [facility].  Lipitor ongoing for hyperlipidemia.  Prediabetes hemoglobin A1c 5.5 close monitoring of blood sugars would need outpatient follow-up.  Bouts of anxiety trial of BuSpar emotional support provided.  Patient had a small stage II ulceration top of penis behind the head and some blanchable redness between buttocks cheeks continued cream ointments routine skin  care.   Blood pressures were monitored on TID basis and controlled  Diabetes has been monitored with ac/hs CBG checks and SSI was use prn for tighter BS control.    Rehab course: During patient's stay in rehab weekly team conferences were held to monitor patient's progress, set goals and discuss barriers to discharge. At admission, patient required minimal assist 50 feet rolling walker minimal assist sit to stand minimal assist sit to supine.  Minimal assist upper body bathing moderate assist lower body bathing minimal assist upper body dressing total assist lower body dressing  Physical exam.  Blood pressure 130/60 pulse 67 temperature 97.9 respirations 18 oxygen saturations 96% room air Constitutional.  No acute distress HEENT Head.  Normocephalic and atraumatic Eyes.  Pupils round and reactive to light no discharge without nystagmus Neck.  Supple nontender no JVD without thyromegaly Cardiac irregular irregular Abdomen.  Soft nontender positive bowel sounds without rebound Respiratory effort normal no respiratory distress without wheeze Musculoskeletal normal range of motion and neck supple Neurologic.  Alert oriented x3 somewhat anxious. Motor.  Bilateral upper extremities 5/5 proximal distal Right lower extremity hip flexion knee extension 2/5 dorsiflexion 4/5 Left lower extremity hip flexion knee extension 2+/5 plantarflexion 4/5 left facial weakness  He/She  has had improvement in activity tolerance, balance, postural control as well as ability to compensate for deficits. He/She has had improvement in functional use RUE/LUE  and RLE/LLE as well as improvement in awareness.  Sit to stand contact-guard assist ambulate 200 feet rolling walker contact-guard assist 80 stands and descends twelve 6 inch steps with 2 handrails contact-guard assist.  Sit to from supine on a 26 height bed supervision level.  Contact-guard overall with balance.  Ambulates with rolling walker to the bathroom  contact-guard assist toileting with supervision.  Return to room stood at sink side to wash hands.  Full family teaching completed plan discharge to home       Disposition: Discharge to home    Diet: Regular consistency  Special Instructions: No driving smoking or alcohol  Medications at discharge 1.  Tylenol as needed 2.  Zovirax 5% 5 times daily to affected area 3.  Ventolin inhaler 2 puffs 4 times daily 4.  Eliquis 5 mg p.o. twice daily 5.  Lipitor 40 mg p.o. daily 6.  BuSpar 7.5 mg p.o. 3 times daily 7.  Vitamin D 2000  units p.o. daily 8.  Claritin 10 mg daily as needed 9.  Multivitamin daily 10.  MiraLAX daily hold for loose stools 11.  Lyrica 25 mg p.o. twice daily 12.  Senokot 2 tablets nightly 13.  Trazodone 50 mg p.o. nightly 14.  Vitamin B12 2000 mcg p.o. daily 15.  MetroGel 0.75% daily as needed   30-35 minutes were spent completing discharge summary and discharge planning  Discharge Instructions    Ambulatory referral to Neurology   Complete by: As directed    An appointment is requested in approximately 4 weeks multifocal mononeuropathy   Ambulatory referral to Physical Medicine Rehab   Complete by: As directed    Moderate complexity follow-up 1 to 2 weeks multifocal mononeuropathy       Follow-up Information    Lovorn, Jinny Blossom, MD Follow up.   Specialty: Physical Medicine and Rehabilitation Why: Office to call for appointment Contact information: [id number] [place] [address] [phone]               Signed: Lavon Paganini [place] 01/01/2021, 5:02 AM

## 2020-12-30 NOTE — Patient Care Conference (Signed)
Inpatient RehabilitationTeam Conference and Plan of Care Update Date: 12/29/2020  Time: 11:10 AM    Patient Name: Evan Kirk      Medical Record Number: [id number]  Date of Birth: 04-22-1941 Sex: Male         Room/Bed: 4M06C/4M06C-01 Payor Info: Payor: [employer] / Plan: [employer] MEDICARE / Product Type: *No Product type* /    Admit Date/Time:  12/23/2020  2:29 PM  Primary Diagnosis:  Multifocal motor neuropathy [facility])  Hospital Problems: Principal Problem:   Multifocal motor neuropathy ([place]) Active Problems:   Cerebral infarction involving left cerebellar artery ([place])   Pressure injury of skin    Expected Discharge Date: Expected Discharge Date: 01/01/21  Team Members Present: Physician leading conference: Dr. Courtney Heys Care Coodinator Present: Loralee Pacas, LCSWA;Jerika Wales Creig Hines, RN, BSN, [place] Nurse Present: Suella Grove, RN PT Present: Excell Seltzer, PT OT Present: Roanna Epley, COTA;Jennifer Tamala Julian, OT PPS Coordinator present : Gunnar Fusi, SLP     Current Status/Progress Goal Weekly Team Focus  Bowel/Bladder   Patient is continent of B/B with episodes at nigt of incontience while using urinal,  Maintain continence  QS/PRN assessment of toileting need   Swallow/Nutrition/ Hydration             ADL's   bathing-CGA; LB dressing-CGA; toileting-CGA; functional transfers and amb with RW-CGA  supervision overall  activity tolerance; standing balance, functional transfers, educaiton, safety awareness   Mobility             Communication   CGA transfers with RW, gait up to 200 ft with RW CGA  Supervision, can upgrade gait distance  endurance, gait training, balance   Safety/Cognition/ Behavioral Observations            Pain   Verbalize pain to mid/lower back  goal pain <=3  Pain assessment QS/PRN address concens   Skin   Skin intact  reddish area to intergluteal cleft of buttock, and penis area  prevent further sin breakdown  QS/PRN assessment  , educate and apply creams as ordered     Discharge Planning:  Pt to have support from his wife who can provide supervision only. Pt to have support from his 5 adult sons who rotate providing assistance.   Team Discussion: Patient said he couldn't breathe last night and told by nursing to notify staff and they can call the MD on-call. Also said he didn't sleep well but during rounding night shift reported he was sleeping. Nursing asked to check O2 stats when patient states he can't breathe, could be related to anxiety. Has a blister around the penis and and it is healing. Continent B/B and didn't receive Mag citrate due to having BM x3 yesterday. Patient on target to meet rehab goals: Patient is contact guard assist with RW with supervision goals. Working on functional transfers, standing balance, and ambulation with RW.   *See Care Plan and progress notes for long and short-term goals.   Revisions to Treatment Plan:  MD adding Busbar for anxiety.  Teaching Needs: Family education, gait training, transfers, medication management, wound/skin care, etc.  Current Barriers to Discharge: Decreased caregiver support, Home enviroment access/layout, Lack of/limited family support and Behavior  Possible Resolutions to Barriers: Continue current medications, provide emotional support to patient and family.     Medical Summary Current Status: cough- L lower lobe infiltrate? Robitussin and throat spray prn; CXR done; blister/redness on penis- looking better; continent B/B overall  Barriers to Discharge: Behavior;Decreased family/caregiver support;Home enviroment access/layout;Medical  stability;Weight;Wound care;Other (comments)  Barriers to Discharge Comments: home with wife- was her caregiver; goals S for PT and OT; is mainly CGA-min A currently. Possible Resolutions to [employer] Focus: CBC today to check for leukocytosis; albuterol scheduled QID; possible ABX;- d/c 1/14/ Friday   Continued  Need for Acute Rehabilitation Level of Care: The patient requires daily medical management by a physician with specialized training in physical medicine and rehabilitation for the following reasons: Direction of a multidisciplinary physical rehabilitation program to maximize functional independence : Yes Medical management of patient stability for increased activity during participation in an intensive rehabilitation regime.: Yes Analysis of laboratory values and/or radiology reports with any subsequent need for medication adjustment and/or medical intervention. : Yes   I attest that I was present, lead the team conference, and concur with the assessment and plan of the team.   Cristi Loron 12/30/2020, 10:22 AM

## 2020-12-30 NOTE — Progress Notes (Signed)
Occupational Therapy Note  Patient Details  Name: Evan Kirk MRN: [id number] Date of Birth: 08-02-1941  Today's Date: 12/30/2020 OT Missed Time: 75 Minutes Missed Time Reason: Patient fatigue  Pt sleeping upon arrival but easily aroused. Pt commented that he had a "rough night" and needed to sleep/rest. Pt missed 75 mins skilled OT services.    Leotis Shames [facility] 12/30/2020, 9:00 AM

## 2020-12-31 ENCOUNTER — Inpatient Hospital Stay (HOSPITAL_COMMUNITY): Payer: Medicare Other | Admitting: Physical Therapy

## 2020-12-31 ENCOUNTER — Encounter (HOSPITAL_COMMUNITY): Payer: Medicare Other

## 2020-12-31 ENCOUNTER — Ambulatory Visit (HOSPITAL_COMMUNITY): Payer: Medicare Other | Admitting: Physical Therapy

## 2020-12-31 LAB — BASIC METABOLIC PANEL
Anion gap: 10 (ref 5–15)
BUN: 15 mg/dL (ref 8–23)
CO2: 27 mmol/L (ref 22–32)
Calcium: 9.6 mg/dL (ref 8.9–10.3)
Chloride: 96 mmol/L — ABNORMAL LOW (ref 98–111)
Creatinine, Ser: 1.1 mg/dL (ref 0.61–1.24)
GFR, Estimated: >60 mL/min (ref >60)
Glucose, Bld: 110 mg/dL — ABNORMAL HIGH (ref 70–99)
Potassium: 3.9 mmol/L (ref 3.5–5.1)
Sodium: 133 mmol/L — ABNORMAL LOW (ref 135–145)

## 2020-12-31 LAB — CBC WITH DIFFERENTIAL/PLATELET
Abs Immature Granulocytes: 0.03 10*3/uL (ref 0.00–0.07)
Basophils Absolute: 0.1 10*3/uL (ref 0.0–0.1)
Basophils Relative: 1 %
Eosinophils Absolute: 0.6 10*3/uL — ABNORMAL HIGH (ref 0.0–0.5)
Eosinophils Relative: 8 %
HCT: 39.3 % (ref 39.0–52.0)
Hemoglobin: 13.2 g/dL (ref 13.0–17.0)
Immature Granulocytes: 0 %
Lymphocytes Relative: 16 %
Lymphs Abs: 1.2 10*3/uL (ref 0.7–4.0)
MCH: 29.9 pg (ref 26.0–34.0)
MCHC: 33.6 g/dL (ref 30.0–36.0)
MCV: 89.1 fL (ref 80.0–100.0)
Monocytes Absolute: 0.8 10*3/uL (ref 0.1–1.0)
Monocytes Relative: 11 %
Neutro Abs: 4.5 10*3/uL (ref 1.7–7.7)
Neutrophils Relative %: 64 %
Platelets: 373 10*3/uL (ref 150–400)
RBC: 4.41 MIL/uL (ref 4.22–5.81)
RDW: 14 % (ref 11.5–15.5)
WBC: 7.2 10*3/uL (ref 4.0–10.5)
nRBC: 0 % (ref 0.0–0.2)

## 2020-12-31 MED ORDER — BUSPIRONE HCL 7.5 MG PO TABS: 7.5000 mg | ORAL_TABLET | Freq: Three times a day (TID) | ORAL | 0 refills | Status: AC

## 2020-12-31 MED ORDER — PREGABALIN 25 MG PO CAPS: 25.0000 mg | ORAL_CAPSULE | Freq: Two times a day (BID) | ORAL | 0 refills | Status: AC

## 2020-12-31 MED ORDER — TRAZODONE HCL 50 MG PO TABS: 50.0000 mg | ORAL_TABLET | Freq: Every day | ORAL | 0 refills | Status: AC

## 2020-12-31 MED ORDER — METRONIDAZOLE 0.75 % EX CREA: 1.0000 "application " | TOPICAL_CREAM | Freq: Every day | CUTANEOUS | 0 refills | Status: AC | PRN

## 2020-12-31 MED ORDER — VITAMIN B-12 500 MCG PO TABS: 2000.0000 ug | ORAL_TABLET | Freq: Every day | ORAL | 0 refills | Status: AC

## 2020-12-31 MED ORDER — ALBUTEROL SULFATE HFA 108 (90 BASE) MCG/ACT IN AERS: 2.0000 | INHALATION_SPRAY | RESPIRATORY_TRACT | 1 refills | Status: AC | PRN

## 2020-12-31 MED ORDER — VITAMIN D 1000 UNITS PO TABS
2000.0000 [IU] | ORAL_TABLET | Freq: Every day | ORAL | 0 refills | Status: AC
Start: 2020-12-31 — End: ?

## 2020-12-31 MED ORDER — APIXABAN 5 MG PO TABS: 5.0000 mg | ORAL_TABLET | Freq: Two times a day (BID) | ORAL | 1 refills | Status: AC

## 2020-12-31 MED ORDER — ATORVASTATIN CALCIUM 40 MG PO TABS: 40.0000 mg | ORAL_TABLET | Freq: Every day | ORAL | 0 refills | Status: AC

## 2020-12-31 MED ORDER — FLUTICASONE PROPIONATE 50 MCG/ACT NA SUSP
2.0000 | Freq: Every day | NASAL | Status: DC
  Administered 2020-12-31 – 2021-01-01 (×2): 2 via NASAL
  Filled 2020-12-31: qty 16

## 2020-12-31 NOTE — Progress Notes (Signed)
Physical Therapy Discharge Summary  Patient Details  Name: Evan Kirk MRN: [id number] Date of Birth: November 26, 1941  Today's Date: 12/31/2020 PT Individual Time: 1884-1660 PT Individual Time Calculation (min): 60 min    Patient has met 9 of 9 long term goals due to improved activity tolerance, improved balance, improved postural control, increased strength and decreased pain.  Patient to discharge at an ambulatory level Supervision.   Patient's care partner is independent to provide the necessary physical assistance at discharge.  Reasons goals not met: all goals met  Recommendation:  Patient will benefit from ongoing skilled PT services in home health setting to continue to advance safe functional mobility, address ongoing impairments in supervision for mobility with gait and balance, and minimize fall risk.  Equipment: Rolling walkre  Reasons for discharge: discharge from hospital  Patient/family agrees with progress made and goals achieved: Yes  PT Discharge Precautions/Restrictions Precautions Precautions: Fall Vital Signs Therapy Vitals Temp: (!) 97.5 F (36.4 C) Temp Source: Oral Pulse Rate: (!) 48 Resp: 18 BP: (!) 145/72 Patient Position (if appropriate): Sitting Oxygen Therapy SpO2: 98 % O2 Device: Room Air Pain Pain Assessment Pain Scale: 0-10 Pain Score: 0-No pain Vision/Perception  Vision - Assessment Eye Alignment: Within Functional Limits Ocular Range of Motion: Within Functional Limits Alignment/Gaze Preference: Within Defined Limits Tracking/Visual Pursuits: Able to track stimulus in all quads without difficulty Perception Perception: Within Functional Limits Praxis Praxis: Intact  Cognition Overall Cognitive Status: Within Functional Limits for tasks assessed Arousal/Alertness: Awake/alert Orientation Level: Oriented X4 Attention: Sustained Sustained Attention: Appears intact Memory: Appears intact Awareness: Appears intact Problem  Solving: Appears intact Safety/Judgment: Appears intact Sensation Sensation Light Touch: Impaired by gross assessment Peripheral sensation comments: h/o neuropathy, reports of n/t distal B LE, R >L Hot/Cold: Appears Intact Proprioception: Appears Intact Coordination Gross Motor Movements are Fluid and Coordinated: Yes Fine Motor Movements are Fluid and Coordinated: Yes Coordination and Movement Description: generalized LE weakness Motor  Motor Motor: Abnormal tone;Abnormal postural alignment and control Motor - Skilled Clinical Observations: generalized weakness B LE  Mobility Bed Mobility Bed Mobility: Rolling Right;Rolling Left;Supine to Sit Rolling Right: Independent with assistive device Rolling Left: Independent with assistive device Supine to Sit: Supervision/Verbal cueing Transfers Transfers: Sit to Stand;Stand to Sit;Stand Pivot Transfers Sit to Stand: Supervision/Verbal cueing Stand to Sit: Supervision/Verbal cueing Stand Pivot Transfers: Supervision/Verbal cueing Transfer (Assistive device): Rolling walker Locomotion  Gait Ambulation: Yes Gait Assistance: Supervision/Verbal cueing Gait Distance (Feet): 150 Feet Assistive device: Rolling walker Gait Gait: Yes Gait Pattern: Impaired Gait velocity: decrease Stairs / Additional Locomotion Stairs: Yes Stairs Assistance: Supervision/Verbal cueing Number of Stairs: 12 Ramp: Contact Guard/touching assist Curb: Contact Guard/Touching assist Wheelchair Mobility Wheelchair Mobility: No  Trunk/Postural Assessment  Cervical Assessment Cervical Assessment: Within Functional Limits Thoracic Assessment Thoracic Assessment:  (kyphosis) Lumbar Assessment Lumbar Assessment:  (posterior pelvic tilt) Postural Control Righting Reactions: delayed and inadequate  Balance Balance Balance Assessed: Yes Standardized Balance Assessment Standardized Balance Assessment: Timed Up and Go Test Timed Up and Go Test TUG: Normal  TUG Normal TUG (seconds): 31.07 (three trials: 31.51, 30.63, 31.08) Extremity Assessment      RLE Assessment General Strength Comments: grossly 4/5 LLE Assessment General Strength Comments: grossly 4/5    Junie Panning 12/31/2020, 3:48 PM

## 2020-12-31 NOTE — Progress Notes (Signed)
Physical Therapy Session Note  Patient Details  Name: Evan Kirk MRN: [id number] Date of Birth: 14-Aug-1941  Today's Date: 12/31/2020 PT Individual Time: 6144-3154 PT Individual Time Calculation (min): 60 min   Short Term Goals: Week 1:  PT Short Term Goal 1 (Week 1): Patient will complete supine <> sit with MinA PT Short Term Goal 2 (Week 1): Patient will be able to complete basic transfers with CGA and LRAD PT Short Term Goal 3 (Week 1): Patient will ambulate >78ft with MinA and LRAD  Skilled Therapeutic Interventions/Progress Updates:    pt received in recliner and agreeable to therapy. Pt directed Sit to stand supervision and gait for 150' with Rolling walker supervision only VC for walker proximity once with navigating an obstacle. Pt directed in ascending/descending ramp supervision with Rolling walker and picking up object from floor supervision. Pt directed in NuStep 10 min L5 with one rest break halfway for improved strength, tolerance to activity with gait. Pt directed in gait 200' to room, Rolling walker supervision with VC for walker proximity once. Pt returned to recliner, left in chair, alarm set, All needs in reach and in good condition. Call light in hand.  Pt had multiple questions about DC, educated on all PT recommendations with supervision at home, walker use, safety with any community needs and removal of all tripping hazards at home and pt agreed and reported he is very motivated to be safe at home and agreeable to all recommendations. Pt reports his sons and wife are all able to assist as needed at home. Pt reported no further concerns about level of mobility and current status.   Therapy Documentation Precautions:  Precautions Precautions: Fall Precaution Comments: bradycardia Restrictions Weight Bearing Restrictions: No General:   Vital Signs: Therapy Vitals Temp: (!) 97.5 F (36.4 C) Temp Source: Oral Pulse Rate: (!) 48 Resp: 18 BP: (!) 145/72 Patient  Position (if appropriate): Sitting Oxygen Therapy SpO2: 98 % O2 Device: Room Air Pain: Pain Assessment Pain Scale: 0-10 Pain Score: 0-No pain Mobility: Bed Mobility Bed Mobility: Rolling Right;Rolling Left;Supine to Sit Rolling Right: Independent with assistive device Rolling Left: Independent with assistive device Supine to Sit: Supervision/Verbal cueing Transfers Transfers: Sit to Stand;Stand to Sit;Stand Pivot Transfers Sit to Stand: Supervision/Verbal cueing Stand to Sit: Supervision/Verbal cueing Stand Pivot Transfers: Supervision/Verbal cueing Transfer (Assistive device): Rolling walker Locomotion : Gait Ambulation: Yes Gait Assistance: Supervision/Verbal cueing Gait Distance (Feet): 150 Feet Assistive device: Rolling walker Gait Gait: Yes Gait Pattern: Impaired Gait velocity: decrease Stairs / Additional Locomotion Stairs: Yes Stairs Assistance: Supervision/Verbal cueing Number of Stairs: 12 Ramp: Contact Guard/touching assist Curb: Contact Guard/Touching assist Wheelchair Mobility Wheelchair Mobility: No  Trunk/Postural Assessment : Cervical Assessment Cervical Assessment: Within Functional Limits Thoracic Assessment Thoracic Assessment:  (kyphosis) Lumbar Assessment Lumbar Assessment:  (posterior pelvic tilt) Postural Control Righting Reactions: delayed and inadequate  Balance: Balance Balance Assessed: Yes Standardized Balance Assessment Standardized Balance Assessment: Timed Up and Go Test Timed Up and Go Test TUG: Normal TUG Normal TUG (seconds): 31.07 (three trials: 31.51, 30.63, 31.08) Exercises:   Other Treatments:      Therapy/Group: Individual Therapy  Junie Panning 12/31/2020, 3:49 PM

## 2020-12-31 NOTE — Progress Notes (Signed)
Occupational Therapy Session Note  Patient Details  Name: TRAJON ROSETE MRN: [id number] Date of Birth: [birth year]  Today's Date: 12/31/2020 OT Individual Time: 1000-1055 OT Individual Time Calculation (min): 55 min    Short Term Goals: Week 1:  OT Short Term Goal 1 (Week 1): STG = LTG due to estimated LOS - overall set up/supervision level for functional transfers and self care with assistive devices/DME  Skilled Therapeutic Interventions/Progress Updates:    Pt resting in with son Harrie Jeans) present. Pt completed shower and dressing tasks at supervision level. Pt amb with RW in room with supervision. Recommended that pt use RW when walking and to NOT "furniture walk" as he was doing prior to this admission. Sit<>stand and standing balance with supervision. Pt's son(s) installing walkin shower and purchasing TTB for pt to use at discharge. Pt and son pleased with progress and ready for discharge home tomorrow. See Care Tool for assist levels.   Therapy Documentation Precautions:  Precautions Precautions: Fall Precaution Comments: bradycardia Restrictions Weight Bearing Restrictions: No Pain: Pain Assessment Pain Scale: 0-10 Pain Score: 0-No pain   Therapy/Group: Individual Therapy  [name] 12/31/2020, 11:01 AM

## 2020-12-31 NOTE — Progress Notes (Signed)
Patient ID: Evan Kirk, male   DOB: 26-Jul-1941, 80 y.o.   MRN: [id number]  SW spoke with [place] [phone] ext [phone]; pager [phone]) to inform on [facility]HA- [facility], and neuropsych evaluation. SW informed all forms orders faxed to Dr. Carie Caddy.   12:20pm- SW met with pt in room to discuss HHA. Pt now states after reviewing with his son, he would like [facility]. Pt understands if [facility] unable to accept, he will need to stick with [facility]. SW informed Amy/liasion with [facility] on above, and will update on status once confirmed. SW paged [place] and waiting on follow-up. SW left message for Adela Lank [phone]) to discuss change with [facility]HA per pt request, and discussed referral.  *SW informed he will review referral and will follow-up.   SW received updates from Cory/[facility] accepted the referral if under [facility]. SW left message for [facility] SW Abundio Miu to update on changes. SW updated Amy/[facility] on changes. SW spoke with pt son Harrie Jeans [phone]) to give updates.   Loralee Pacas, MSW, [place] Office: [phone] Cell: [phone] Fax: [phone]

## 2020-12-31 NOTE — Progress Notes (Signed)
Pt. States he can place cpap on himself when ready. 

## 2020-12-31 NOTE — Discharge Instructions (Signed)
Inpatient Rehab Discharge Instructions  CARLESS SLATTEN Discharge date and time: No discharge date for patient encounter.   Activities/Precautions/ Functional Status: Activity: activity as tolerated Diet: regular diet Wound Care: Routine skin checks Functional status:  ___ No restrictions     ___ Walk up steps independently ___ 24/7 supervision/assistance   ___ Walk up steps with assistance ___ Intermittent supervision/assistance  ___ Bathe/dress independently ___ Walk with walker     _x__ Bathe/dress with assistance ___ Walk Independently    ___ Shower independently ___ Walk with assistance    ___ Shower with assistance ___ No alcohol     ___ Return to work/school ________  COMMUNITY REFERRALS UPON DISCHARGE:    Home Health:   PT    OT      SNA                    Agency: [facility]   Phone: [phone] *Please expect follow-up within 2-3 days to discuss scheduling home visit. If you have not received follow-up be sure to contact the agency directly.     Medical Equipment/Items Ordered: No DME recommended                                                 Agency/Supplier: N/A   Special Instructions: No driving smoking or alcohol  [facility]  Follow-up [facility] for ongoing immunotherapy   My questions have been answered and I understand these instructions. I will adhere to these goals and the provided educational materials after my discharge from the hospital.  Patient/Caregiver Signature _______________________________ Date __________  Clinician Signature _______________________________________ Date __________  Please bring this form and your medication list with you to all your follow-up doctor's appointments.

## 2020-12-31 NOTE — Progress Notes (Signed)
[place] PHYSICAL MEDICINE & REHABILITATION PROGRESS NOTE   Subjective/Complaints:  Pt awake- reports coughing less frequent and doesn't last as long- throat still sore. Feeling better today.  Doesn't feel bad-  Had 4 small BMs yesterday, but they added up.  Penis is looking better per pt- ready for d/c tomorrow.   Since Na was 129 and rechecking WBC, will check labs now and not tomorrow.     ROS:   Pt denies SOB, abd pain, CP, N/V/C/D, and vision changes .   Objective:   DG CHEST PORT 1 VIEW  Result Date: 12/29/2020 CLINICAL DATA:  Cough.  Respiratory congestion EXAM: PORTABLE CHEST 1 VIEW COMPARISON:  12/19/2020 FINDINGS: Midline trachea. Mild cardiomegaly. Atherosclerosis in the transverse aorta. No pleural effusion or pneumothorax. Clear right lung. Development of lateral left base mild airspace disease. IMPRESSION: Developing lateral left lower lobe airspace disease, atelectasis or infection/aspiration. Aortic Atherosclerosis (ICD10-I70.0). Electronically Signed   By: Abigail Miyamoto M.D.   On: 12/29/2020 09:30   Recent Labs    12/29/20 1100  WBC 9.0  HGB 12.9*  HCT 40.4  PLT 387   Recent Labs    12/29/20 1100  NA 129*  K 4.2  CL 97*  CO2 22  GLUCOSE 103*  BUN 13  CREATININE 1.20  CALCIUM 10.2    Intake/Output Summary (Last 24 hours) at 12/31/2020 0914 Last data filed at 12/31/2020 6269 Gross per 24 hour  Intake 704 ml  Output 500 ml  Net 204 ml     Pressure Injury 12/23/20 Coccyx Medial Stage 1 -  Intact skin with non-blanchable redness of a localized area usually over a bony prominence. (Active)  12/23/20 1603  Location: Coccyx  Location Orientation: Medial  Staging: Stage 1 -  Intact skin with non-blanchable redness of a localized area usually over a bony prominence.  Wound Description (Comments):   Present on Admission: Yes    Physical Exam: Vital Signs Blood pressure 140/60, pulse 71, temperature 98.2 F (36.8 C), resp. rate 18, height 5\' 6"   (1.676 m), weight 97.4 kg, SpO2 97 %.  Constitutional: awake, sitting up in bedside chair, appropriate, eating breakfast, NAD HEENT: EOMI, oral membranes moist- no sig erythema of oropharynx Neck: supple Cardiovascular: irregular- rate controlled Respiratory/Chest: much less coarse- no W/R/R- slightly decreased on L, but better GI/Abdomen: Soft, NT, ND, (+)BS   Ext: no clubbing, cyanosis, or edema Psych: bright affect- about d/c tomorrow Musculoskeletal:     Cervical back: Normal range of motion and neck supple.     Comments: no edema/ tenderness in exts Skin:    General: Skin is warm and dry. Has a very red spot on his penis- just behind the head of penis- not open, but looks raw/angry; Also has a little blanchable redness between buttocks cheeks- I don't see anything on coccyx like described.   Neurological: Ox3- off CPAP this AM for first time.  Motor: Bilateral upper extremities: 5/5 proximal distal Right lower extremity: Hip flexion, knee extension 2/5, dorsiflexion 4/5 Left lower extremity: Hip flexion, knee extension 2+/5 plantarflexion 4/5 Left facial weakness ongoing Psychiatric:  appropriate    Assessment/Plan: 1. Functional deficits which require 3+ hours per day of interdisciplinary therapy in a comprehensive inpatient rehab setting.  Physiatrist is providing close team supervision and 24 hour management of active medical problems listed below.  Physiatrist and rehab team continue to assess barriers to discharge/monitor patient progress toward functional and medical goals  Care Tool:  Bathing    Body parts  bathed by patient: Right arm,Left arm,Chest,Abdomen,Front perineal area,Buttocks,Right upper leg,Left upper leg,Right lower leg,Left lower leg,Face   Body parts bathed by helper: Buttocks,Right lower leg,Left lower leg     Bathing assist Assist Level: Minimal Assistance - Patient > 75%     Upper Body Dressing/Undressing Upper body dressing   What is the  patient wearing?: Pull over shirt    Upper body assist Assist Level: Set up assist    Lower Body Dressing/Undressing Lower body dressing      What is the patient wearing?: Pants     Lower body assist Assist for lower body dressing: Supervision/Verbal cueing     Toileting Toileting    Toileting assist Assist for toileting: Supervision/Verbal cueing Assistive Device Comment:  (urinal)   Transfers Chair/bed transfer  Transfers assist     Chair/bed transfer assist level: Contact Guard/Touching assist Chair/bed transfer assistive device: Geologist, engineering   Ambulation assist      Assist level: Contact Guard/Touching assist Assistive device: Walker-rolling Max distance: 200 ft   Walk 10 feet activity   Assist     Assist level: Contact Guard/Touching assist Assistive device: Walker-rolling   Walk 50 feet activity   Assist Walk 50 feet with 2 turns activity did not occur: Safety/medical concerns  Assist level: Contact Guard/Touching assist Assistive device: Walker-rolling    Walk 150 feet activity   Assist Walk 150 feet activity did not occur: Safety/medical concerns  Assist level: Contact Guard/Touching assist Assistive device: Walker-rolling    Walk 10 feet on uneven surface  activity   Assist Walk 10 feet on uneven surfaces activity did not occur: Safety/medical concerns         Wheelchair     Assist Will patient use wheelchair at discharge?: Yes Type of Wheelchair: Manual    Wheelchair assist level: Supervision/Verbal cueing Max wheelchair distance: 150    Wheelchair 50 feet with 2 turns activity    Assist        Assist Level: Supervision/Verbal cueing   Wheelchair 150 feet activity     Assist      Assist Level: Supervision/Verbal cueing   Blood pressure 140/60, pulse 71, temperature 98.2 F (36.8 C), resp. rate 18, height 5\' 6"  (1.676 m), weight 97.4 kg, SpO2 97 %.  Medical Problem List and  Plan: 1. 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             -patient may shower             -ELOS/Goals: 14-17 days/Supervision/min a             continue therapies 2.  Antithrombotics: -DVT/anticoagulation: Eliquis             -antiplatelet therapy: N/A 3. Pain Management: Lyrica 25 mg twice daily  1/6- pain is chronic, but overall controlled- con't regimen  1/12- pain controlled- con't regimen 4. Mood: Provide emotional support.               -antipsychotic agents: N/A 5. Neuropsych: This patient is capable of making decisions on his own behalf. 6. Skin/Wound Care: Routine skin checks  1/6- Possible pressure ulcer- will check skin  1/7- has stage II on top of penis behind head and some blanchable redness between buttocks/cheeks- con't creams that nursing is using per pt.   1/13- looking better- d/c with cream 7. Fluids/Electrolytes/Nutrition: Routine in and outs               8.  Atrial fibrillation.  Continue Eliquis.  Cardiac rate controlled             Monitor with increased activity  1/6- rate bradycardic this AM- will monitor closely- might need to titrate meds  1/7-8- Rate 70s   con't regimen  1/12- rate slightly low- 48-55- but usually bumps right back up- was sleeping- con't regimen  1/13- Rate controlled today- con't regimen 9.  OSA.  CPAP 10.  Active Recurrent renal carcinoma.  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 Hyperlipidemia: Lipitor 12. 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  14. Hyponatremia             Sodium 134 on 1/3, CMP ordered for tomorrow    1/12- Na 129- will recheck Friday- and see if improving- Cr also slightly up to 1.20- will monitor  1/13- will recheck labs  TODAY since d/c is tomorrow 15. Prediabetes?  1/6- BG 106 this AM, but A1c is 5.5- will monitor for stress rxn/hyperglycemia 16. Constipation-  1/7- no BM for 5+ days- failed sorbitol and soap suds enema- will give Mg citrate in addition to miralax  And senokot already ordered and suppository if needed afterwards. Is ordered.    1/8 pt with multiple bm's last night   -continue mtc regimen  1/10- asking for Mg citrate again- will order after therapy. LBM Thursday except for "squirts".  17. Coughing? L lateral lobe infiltrate?  1/10- will give tessalon perles TID prn for cough  1/11- Will d/c tessalon and start Throat spray and robittussin prn. Will give albuterol for 3 days then prn; and CXR done- has L lateral lobe infiltrate? Will check CBC and BMP today to verify if has infection.  1/12- WBC is normal at 9.0- is stable- will wait on ABX. Per nursing, coughing is less with Robitussin  1/13- Sx's better- will add Flonase for nasal congestion. .  18. Anxiety?  1/11- will try Buspar 7.5 mg TID for anxiety- also if feels SOB, asked him to get nursing to check O2 sats.      LOS: 8 days A FACE TO FACE EVALUATION WAS PERFORMED  Evan Kirk 12/31/2020, 9:14 AM

## 2020-12-31 NOTE — Progress Notes (Addendum)
Physical Therapy Session Note  Patient Details  Name: Evan Kirk MRN: [id number] Date of Birth: 13-Jan-1941  Today's Date: 12/31/2020 PT Individual Time: 1105-1200 PT Individual Time Calculation (min): 55 min   Short Term Goals: Week 1:  PT Short Term Goal 1 (Week 1): Patient will complete supine <> sit with MinA PT Short Term Goal 2 (Week 1): Patient will be able to complete basic transfers with CGA and LRAD PT Short Term Goal 3 (Week 1): Patient will ambulate >85f with MinA and LRAD  Skilled Therapeutic Interventions/Progress Updates: Pt presented in recliner with son present agreeable to therapy. Pt denies pain during session. PTA discussed with family anticipate from CIR at supervision level with session focusing on observation and problem solving. Performed STS from recliner with supervision and ambulated to ortho gym in preparation for car transfer. [place] was able to perform car transfer at supervision level from elevated car seat demonstrating good safety. Pt then ambulated to ADL apt and performed furniture transfer and mobility in kitchen at supervision level. Pt was able to reach objects in fridge as well as from higher cabinet. Pt then ambulated to rehab gym and participated in ascending/descending x 12 steps with B rails and supervision. Participated in obstacle course weaving through cones, stepping over thresholds as well as gait on compliant/uneven surface. [facility] with overall good safety. Pt retested in TUG with improved score of 31.07 from initial score of 3:07. Provided continued education on energy conservation particularly initially after d/c. PTA was able to answer any current queries from pt and son regarding d/c and home follow up. Pt then ambulated back to room with RW and supervision. Pt returned to recliner and left with seat alarm on, call bell within reach and needs met.       Therapy Documentation Precautions:  Precautions Precautions: Fall Precaution Comments: bradycardia Restrictions Weight Bearing Restrictions: No General:      Therapy/Group: Individual Therapy  Johnnie Moten  Marshea Wisher, PTA  12/31/2020, 12:11 PM

## 2020-12-31 NOTE — Progress Notes (Shared)
Occupational Therapy Discharge Summary  Patient Details  Name: ADONNIS SALCEDA MRN: [id number] Date of Birth: 05-23-1941  Patient has met 97 of 68 long term goals due to {due JK:0938182}.  Pt made steady progress with BADLs and IADLs during this admission. Bathing at shower level with supervision. LB dressing, toilet tranfsers, and toileting with supervision. Simple kitchen tasks and housekeeping tasks with supervison using RW. Pt takes rest breaks appropriately and demonstrates good safety awareness. Pt's son, Harrie Jeans, has been present for education and has verbalzied understanding of recommendations. Patient to discharge at overall {LOA:3049010} level.  Patient's care partner {care partner:3041650} to provide the necessary {assistance:3041652} assistance at discharge.    Reasons goals not met: ***  Recommendation:  Patient will benefit from ongoing skilled OT services in {setting:3041680} to continue to advance functional skills in the area of {ADL/iADL:3041649}.  Equipment: No equipment provided  Reasons for discharge: {Reason for discharge:3049018}  Patient/family agrees with progress made and goals achieved: {Pt/Family agree with progress/goals:3049020}  OT Discharge   Vision Baseline Vision/History: Wears glasses Wears Glasses: At all times Patient Visual Report: No change from baseline Vision Assessment?: No apparent visual deficits Perception  Perception: Within Functional Limits Praxis Praxis: Intact Cognition Overall Cognitive Status: Within Functional Limits for tasks assessed Arousal/Alertness: Awake/alert Orientation Level: Oriented X4 Attention: Sustained Sustained Attention: Appears intact Memory: Appears intact Awareness: Appears intact Problem Solving: Appears intact Safety/Judgment: Appears intact Sensation Sensation Light Touch: Impaired by gross assessment Peripheral sensation comments: h/o neuropathy, reports of n/t distal B LE, R >L Hot/Cold: Appears  Intact Proprioception: Appears Intact Stereognosis: Not tested Additional Comments: upper body sensation intact for hot/cold and light touch Coordination Gross Motor Movements are Fluid and Coordinated: Yes Fine Motor Movements are Fluid and Coordinated: Yes Finger Nose Finger Test: [facility] Motor  Motor Motor - Skilled Clinical Observations: generalized weakness B LE Trunk/Postural Assessment  Cervical Assessment Cervical Assessment: Within Functional Limits Thoracic Assessment Thoracic Assessment:  (kyphosis) Lumbar Assessment Lumbar Assessment:  (posterior pelvic tilt) Postural Control Righting Reactions: delayed and inadequate  Balance Static Sitting Balance Static Sitting - Level of Assistance: 6: Modified independent (Device/Increase time) Dynamic Sitting Balance Dynamic Sitting - Level of Assistance: 6: Modified independent (Device/Increase time) Extremity/Trunk Assessment RUE Assessment RUE Assessment: Within Functional Limits LUE Assessment LUE Assessment: Within Functional Limits   [name] 12/31/2020, 7:02 AM

## 2021-01-01 MED ORDER — DM-GUAIFENESIN ER 30-600 MG PO TB12
1.0000 | ORAL_TABLET | Freq: Two times a day (BID) | ORAL | Status: DC
  Administered 2021-01-01: 1 via ORAL
  Filled 2021-01-01: qty 1

## 2021-01-01 NOTE — Progress Notes (Signed)
Inpatient Rehabilitation Care Coordinator Discharge Note  The overall goal for the admission was met for:   Discharge location: Yes. D/c to home with supervision from wife, and support from his sons.   Length of Stay: Yes. 8 days.  Discharge activity level: Yes. Supervision.  Home/community participation: Yes. Limited.   Services provided included: MD, RD, PT, OT, RN, CM, TR, Pharmacy, Neuropsych and SW  Financial Services: Private Insurance: [facility] Medicare and [place]  Choices offered to/list presented HU:[place].gov list  Follow-up services arranged: Home Health: [facility] for HHPT/OT/aide, DME: No DME needed and Patient/Family request agency HH: [facility], DME: N/A  Comments (or additional information): contact pt [phone] or son Harrie Jeans [phone]  Patient/Family verbalized understanding of follow-up arrangements: Yes  Individual responsible for coordination of the follow-up plan: Pt to have assistance with coordinating his care needs.   Confirmed correct DME delivered: Rana Snare 01/01/2021    Rana Snare

## 2021-01-01 NOTE — Progress Notes (Signed)
Patient discharged at 11:16 am

## 2021-01-01 NOTE — Progress Notes (Signed)
[place] PHYSICAL MEDICINE & REHABILITATION PROGRESS NOTE   Subjective/Complaints: +cough with yellow sputum production Denies SOB Ready for d/c today  No other complaints  ROS:  Pt denies SOB, abd pain, CP, N/V/C/D, and vision changes  Objective:   No results found. Recent Labs    12/29/20 1100 12/31/20 0932  WBC 9.0 7.2  HGB 12.9* 13.2  HCT 40.4 39.3  PLT 387 373   Recent Labs    12/29/20 1100 12/31/20 0932  NA 129* 133*  K 4.2 3.9  CL 97* 96*  CO2 22 27  GLUCOSE 103* 110*  BUN 13 15  CREATININE 1.20 1.10  CALCIUM 10.2 9.6    Intake/Output Summary (Last 24 hours) at 01/01/2021 0917 Last data filed at 01/01/2021 [id number] Gross per 24 hour  Intake 1128 ml  Output 1300 ml  Net -172 ml     Pressure Injury 12/23/20 Coccyx Medial Stage 1 -  Intact skin with non-blanchable redness of a localized area usually over a bony prominence. (Active)  12/23/20 1603  Location: Coccyx  Location Orientation: Medial  Staging: Stage 1 -  Intact skin with non-blanchable redness of a localized area usually over a bony prominence.  Wound Description (Comments):   Present on Admission: Yes    Physical Exam: Vital Signs Blood pressure (!) 104/57, pulse 64, temperature 98.1 F (36.7 C), resp. rate 20, height 5\' 6"  (1.676 m), weight 97.2 kg, SpO2 99 %.  Gen: no distress, normal appearing HEENT: oral mucosa pink and moist, NCAT Cardio: Reg rate Chest: normal effort, normal rate of breathing Abd: soft, non-distended Ext: no edema Psych: pleasant, normal affect Musculoskeletal:     Cervical back: Normal range of motion and neck supple.     Comments: no edema/ tenderness in exts Skin:    General: Skin is warm and dry. Has a very red spot on his penis- just behind the head of penis- not open, but looks raw/angry; Also has a little blanchable redness between buttocks cheeks- I don't see anything on coccyx like described.   Neurological: Ox3- off CPAP this AM for first time.   Motor: Bilateral upper extremities: 5/5 proximal distal Right lower extremity: Hip flexion, knee extension 2/5, dorsiflexion 4/5 Left lower extremity: Hip flexion, knee extension 2+/5 plantarflexion 4/5 Left facial weakness ongoing Psychiatric:  appropriate    Assessment/Plan: 1. Functional deficits which require 3+ hours per day of interdisciplinary therapy in a comprehensive inpatient rehab setting.  Physiatrist is providing close team supervision and 24 hour management of active medical problems listed below.  Physiatrist and rehab team continue to assess barriers to discharge/monitor patient progress toward functional and medical goals  Care Tool:  Bathing    Body parts bathed by patient: Right arm,Left arm,Chest,Abdomen,Front perineal area,Buttocks,Right upper leg,Left upper leg,Right lower leg,Left lower leg,Face   Body parts bathed by helper: Buttocks,Right lower leg,Left lower leg     Bathing assist Assist Level: Supervision/Verbal cueing     Upper Body Dressing/Undressing Upper body dressing   What is the patient wearing?: Pull over shirt    Upper body assist Assist Level: Independent    Lower Body Dressing/Undressing Lower body dressing      What is the patient wearing?: Pants     Lower body assist Assist for lower body dressing: Supervision/Verbal cueing     Toileting Toileting    Toileting assist Assist for toileting: Supervision/Verbal cueing Assistive Device Comment:  (urinal)   Transfers Chair/bed transfer  Transfers assist     Chair/bed transfer  assist level: Contact Guard/Touching assist Chair/bed transfer assistive device: Museum/gallery exhibitions officer assist      Assist level: Supervision/Verbal cueing Assistive device: Walker-rolling Max distance: 148ft   Walk 10 feet activity   Assist     Assist level: Supervision/Verbal cueing Assistive device: Walker-rolling   Walk 50 feet activity   Assist Walk  50 feet with 2 turns activity did not occur: Safety/medical concerns  Assist level: Supervision/Verbal cueing Assistive device: Walker-rolling    Walk 150 feet activity   Assist Walk 150 feet activity did not occur: Safety/medical concerns  Assist level: Supervision/Verbal cueing Assistive device: Walker-rolling    Walk 10 feet on uneven surface  activity   Assist Walk 10 feet on uneven surfaces activity did not occur: Safety/medical concerns   Assist level: Contact Guard/Touching assist Assistive device: Aeronautical engineer Will patient use wheelchair at discharge?: No Type of Wheelchair: Manual    Wheelchair assist level: Supervision/Verbal cueing Max wheelchair distance: 150    Wheelchair 50 feet with 2 turns activity    Assist        Assist Level: Supervision/Verbal cueing   Wheelchair 150 feet activity     Assist      Assist Level: Supervision/Verbal cueing   Blood pressure (!) 104/57, pulse 64, temperature 98.1 F (36.7 C), resp. rate 20, height 5\' 6"  (1.676 m), weight 97.2 kg, SpO2 99 %.  Medical Problem List and Plan: 1. 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             -patient may shower            Dc home today 2.  Antithrombotics: -DVT/anticoagulation: Eliquis             -antiplatelet therapy: N/A 3. Pain Management: Lyrica 25 mg twice daily  1/6- pain is chronic, but overall controlled- con't regimen  1/12- pain controlled- con't regimen 4. Mood: Provide emotional support.               -antipsychotic agents: N/A 5. Neuropsych: This patient is capable of making decisions on his own behalf. 6. Skin/Wound Care: Routine skin checks  1/6- Possible pressure ulcer- will check skin  1/7- has stage II on top of penis behind head and some blanchable redness between buttocks/cheeks- con't creams that nursing is using per pt.   1/13- looking better- d/c with cream 7.  Fluids/Electrolytes/Nutrition: Routine in and outs               8.  Atrial fibrillation.  Continue Eliquis.  Cardiac rate controlled             Monitor with increased activity  1/6- rate bradycardic this AM- will monitor closely- might need to titrate meds  1/7-8- Rate 70s   con't regimen  1/12- rate slightly low- 48-55- but usually bumps right back up- was sleeping- con't regimen  1/13- Rate controlled today- con't regimen 9.  OSA.  CPAP 10.  Active Recurrent renal carcinoma.  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 Hyperlipidemia: Continue Lipitor 12. 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  14. Hyponatremia  Sodium 134 on 1/3, CMP ordered for tomorrow    1/12- Na 129- will recheck Friday- and see if improving- Cr also slightly up to 1.20- will monitor  1/13- will recheck labs TODAY since d/c is tomorrow 15. Prediabetes?  1/6- BG 106 this AM, but A1c is 5.5- will monitor for stress rxn/hyperglycemia 16. Constipation-  1/7- no BM for 5+ days- failed sorbitol and soap suds enema- will give Mg citrate in addition to miralax  And senokot already ordered and suppository if needed afterwards. Is ordered.    1/8 pt with multiple bm's last night   -continue mtc regimen  1/10- asking for Mg citrate again- will order after therapy. LBM Thursday except for "squirts".  17. Coughing? L lateral lobe infiltrate?  1/10- will give tessalon perles TID prn for cough  1/11- Will d/c tessalon and start Throat spray and robittussin prn. Will give albuterol for 3 days then prn; and CXR done- has L lateral lobe infiltrate? Will check CBC and BMP today to verify if has infection.  1/12- WBC is normal at 9.0- is stable- will wait on ABX. Per nursing,  coughing is less with Robitussin  1/13- Sx's better- will add Flonase for nasal congestion. .  18. Anxiety?  1/11- will try Buspar 7.5 mg TID for anxiety- also if feels SOB, asked him to get nursing to check O2 sats.  19. Productive cough, no SOB: add mucinex.  20. Hypotension: monitor outpatient. Medications reviewed and not on any anti-hypertensives.    >30 minutes spent in discharge of patient including review of medications and follow-up appointments, physical examination, and in answering all patient's questions     LOS: 9 days A FACE TO FACE EVALUATION WAS [place] 01/01/2021, 9:17 AM

## 2021-01-05 ENCOUNTER — Telehealth: Payer: Self-pay

## 2021-01-05 NOTE — Telephone Encounter (Signed)
Transitional Care Call--who you spoke with Mr. Evan Kirk. Bones   1. Are you/is patient experiencing any problems since coming home? No.  Are there any questions regarding any aspect of care? No.  2. Are there any questions regarding medications administration/dosing? I have not picked up my medications because of the weather. Are meds being taken as prescribed? The medications will be picked up today.  Patient should review meds with caller to confirm 3. Have there been any falls? No.  4. Has Home Health been to the house and/or have they contacted you? Yes. [facility] has called to scheduled a visit.  If not, have you tried to contact them? N/A  Can we help you contact them? No need.  5. Are bowels and bladder emptying properly? Yes.  Are there any unexpected incontinence issues? No problem.  If applicable, is patient following bowel/bladder programs? N/A. 6. Any fevers, problems with breathing, unexpected pain? "No just the cold that I came home with from the hospital." 7. Are there any skin problems or new areas of breakdown? No. 8. Has the patient/family member arranged specialty MD follow up (ie cardiology/neurology/renal/surgical/etc)? Contact phone numbers given to patient.  Can we help arrange? No.  9. Does the patient need any other services or support that we can help arrange? No. Are caregivers following through as expected in assisting the patient? Yes.        11. Has the patient quit smoking, drinking alcohol, or using drugs as recommended? Per patient he does not smoke, drink alcohol or use drugs.   Appointment Date/Time/ Arrival time/ and who they are seeing [place] [address] Dr. Dagoberto Ligas on 01/18/2021 at 3:20 PM.

## 2021-01-05 NOTE — Telephone Encounter (Signed)
Done

## 2021-01-07 DIAGNOSIS — G6182 Multifocal motor neuropathy: Secondary | ICD-10-CM | POA: Diagnosis not present

## 2021-01-07 DIAGNOSIS — I0981 Rheumatic heart failure: Secondary | ICD-10-CM | POA: Diagnosis not present

## 2021-01-07 DIAGNOSIS — I110 Hypertensive heart disease with heart failure: Secondary | ICD-10-CM | POA: Diagnosis not present

## 2021-01-07 DIAGNOSIS — M4316 Spondylolisthesis, lumbar region: Secondary | ICD-10-CM | POA: Diagnosis not present

## 2021-01-07 DIAGNOSIS — I5032 Chronic diastolic (congestive) heart failure: Secondary | ICD-10-CM | POA: Diagnosis not present

## 2021-01-08 DIAGNOSIS — I110 Hypertensive heart disease with heart failure: Secondary | ICD-10-CM | POA: Diagnosis not present

## 2021-01-08 DIAGNOSIS — I5032 Chronic diastolic (congestive) heart failure: Secondary | ICD-10-CM | POA: Diagnosis not present

## 2021-01-08 DIAGNOSIS — M4316 Spondylolisthesis, lumbar region: Secondary | ICD-10-CM | POA: Diagnosis not present

## 2021-01-08 DIAGNOSIS — G6182 Multifocal motor neuropathy: Secondary | ICD-10-CM | POA: Diagnosis not present

## 2021-01-08 DIAGNOSIS — I0981 Rheumatic heart failure: Secondary | ICD-10-CM | POA: Diagnosis not present

## 2021-01-12 DIAGNOSIS — G629 Polyneuropathy, unspecified: Secondary | ICD-10-CM | POA: Diagnosis not present

## 2021-01-12 DIAGNOSIS — Z09 Encounter for follow-up examination after completed treatment for conditions other than malignant neoplasm: Secondary | ICD-10-CM | POA: Diagnosis not present

## 2021-01-12 DIAGNOSIS — I69359 Hemiplegia and hemiparesis following cerebral infarction affecting unspecified side: Secondary | ICD-10-CM | POA: Diagnosis not present

## 2021-01-12 DIAGNOSIS — I639 Cerebral infarction, unspecified: Secondary | ICD-10-CM | POA: Diagnosis not present

## 2021-01-13 DIAGNOSIS — I110 Hypertensive heart disease with heart failure: Secondary | ICD-10-CM | POA: Diagnosis not present

## 2021-01-13 DIAGNOSIS — I5032 Chronic diastolic (congestive) heart failure: Secondary | ICD-10-CM | POA: Diagnosis not present

## 2021-01-13 DIAGNOSIS — M4316 Spondylolisthesis, lumbar region: Secondary | ICD-10-CM | POA: Diagnosis not present

## 2021-01-13 DIAGNOSIS — I0981 Rheumatic heart failure: Secondary | ICD-10-CM | POA: Diagnosis not present

## 2021-01-13 DIAGNOSIS — G6182 Multifocal motor neuropathy: Secondary | ICD-10-CM | POA: Diagnosis not present

## 2021-01-15 DIAGNOSIS — I0981 Rheumatic heart failure: Secondary | ICD-10-CM | POA: Diagnosis not present

## 2021-01-15 DIAGNOSIS — M4316 Spondylolisthesis, lumbar region: Secondary | ICD-10-CM | POA: Diagnosis not present

## 2021-01-15 DIAGNOSIS — I110 Hypertensive heart disease with heart failure: Secondary | ICD-10-CM | POA: Diagnosis not present

## 2021-01-15 DIAGNOSIS — I5032 Chronic diastolic (congestive) heart failure: Secondary | ICD-10-CM | POA: Diagnosis not present

## 2021-01-15 DIAGNOSIS — G6182 Multifocal motor neuropathy: Secondary | ICD-10-CM | POA: Diagnosis not present

## 2021-01-18 ENCOUNTER — Other Ambulatory Visit: Payer: Self-pay

## 2021-01-18 ENCOUNTER — Encounter: Payer: Self-pay | Admitting: Physical Medicine and Rehabilitation

## 2021-01-18 ENCOUNTER — Encounter
Payer: Medicare Other | Attending: Physical Medicine and Rehabilitation | Admitting: Physical Medicine and Rehabilitation

## 2021-01-18 VITALS — BP 117/66 | HR 80 | Temp 97.2°F | Ht 66.0 in | Wt 213.0 lb

## 2021-01-18 DIAGNOSIS — M1710 Unilateral primary osteoarthritis, unspecified knee: Secondary | ICD-10-CM | POA: Diagnosis not present

## 2021-01-18 DIAGNOSIS — M4316 Spondylolisthesis, lumbar region: Secondary | ICD-10-CM | POA: Diagnosis not present

## 2021-01-18 DIAGNOSIS — M4184 Other forms of scoliosis, thoracic region: Secondary | ICD-10-CM | POA: Diagnosis not present

## 2021-01-18 DIAGNOSIS — E785 Hyperlipidemia, unspecified: Secondary | ICD-10-CM

## 2021-01-18 DIAGNOSIS — N138 Other obstructive and reflux uropathy: Secondary | ICD-10-CM

## 2021-01-18 DIAGNOSIS — G6182 Multifocal motor neuropathy: Secondary | ICD-10-CM | POA: Insufficient documentation

## 2021-01-18 DIAGNOSIS — I110 Hypertensive heart disease with heart failure: Secondary | ICD-10-CM | POA: Diagnosis not present

## 2021-01-18 DIAGNOSIS — I083 Combined rheumatic disorders of mitral, aortic and tricuspid valves: Secondary | ICD-10-CM | POA: Diagnosis not present

## 2021-01-18 DIAGNOSIS — J9811 Atelectasis: Secondary | ICD-10-CM

## 2021-01-18 DIAGNOSIS — M47816 Spondylosis without myelopathy or radiculopathy, lumbar region: Secondary | ICD-10-CM | POA: Diagnosis not present

## 2021-01-18 DIAGNOSIS — I63542 Cerebral infarction due to unspecified occlusion or stenosis of left cerebellar artery: Secondary | ICD-10-CM | POA: Diagnosis not present

## 2021-01-18 DIAGNOSIS — R7303 Prediabetes: Secondary | ICD-10-CM

## 2021-01-18 DIAGNOSIS — I5032 Chronic diastolic (congestive) heart failure: Secondary | ICD-10-CM | POA: Diagnosis not present

## 2021-01-18 DIAGNOSIS — I700 Atherosclerosis of aorta: Secondary | ICD-10-CM

## 2021-01-18 DIAGNOSIS — L89892 Pressure ulcer of other site, stage 2: Secondary | ICD-10-CM | POA: Insufficient documentation

## 2021-01-18 DIAGNOSIS — I0981 Rheumatic heart failure: Secondary | ICD-10-CM | POA: Diagnosis not present

## 2021-01-18 DIAGNOSIS — Z7901 Long term (current) use of anticoagulants: Secondary | ICD-10-CM

## 2021-01-18 DIAGNOSIS — I2510 Atherosclerotic heart disease of native coronary artery without angina pectoris: Secondary | ICD-10-CM | POA: Diagnosis not present

## 2021-01-18 DIAGNOSIS — Z8673 Personal history of transient ischemic attack (TIA), and cerebral infarction without residual deficits: Secondary | ICD-10-CM

## 2021-01-18 DIAGNOSIS — K8020 Calculus of gallbladder without cholecystitis without obstruction: Secondary | ICD-10-CM

## 2021-01-18 DIAGNOSIS — Z9181 History of falling: Secondary | ICD-10-CM

## 2021-01-18 DIAGNOSIS — I482 Chronic atrial fibrillation, unspecified: Secondary | ICD-10-CM | POA: Diagnosis not present

## 2021-01-18 DIAGNOSIS — F4312 Post-traumatic stress disorder, chronic: Secondary | ICD-10-CM

## 2021-01-18 DIAGNOSIS — G250 Essential tremor: Secondary | ICD-10-CM

## 2021-01-18 DIAGNOSIS — Z6835 Body mass index (BMI) 35.0-35.9, adult: Secondary | ICD-10-CM

## 2021-01-18 DIAGNOSIS — Z9981 Dependence on supplemental oxygen: Secondary | ICD-10-CM

## 2021-01-18 DIAGNOSIS — M48061 Spinal stenosis, lumbar region without neurogenic claudication: Secondary | ICD-10-CM | POA: Diagnosis not present

## 2021-01-18 DIAGNOSIS — I872 Venous insufficiency (chronic) (peripheral): Secondary | ICD-10-CM

## 2021-01-18 DIAGNOSIS — Z905 Acquired absence of kidney: Secondary | ICD-10-CM

## 2021-01-18 DIAGNOSIS — Z96659 Presence of unspecified artificial knee joint: Secondary | ICD-10-CM

## 2021-01-18 DIAGNOSIS — K5730 Diverticulosis of large intestine without perforation or abscess without bleeding: Secondary | ICD-10-CM

## 2021-01-18 DIAGNOSIS — G4733 Obstructive sleep apnea (adult) (pediatric): Secondary | ICD-10-CM

## 2021-01-18 DIAGNOSIS — E6609 Other obesity due to excess calories: Secondary | ICD-10-CM

## 2021-01-18 DIAGNOSIS — N401 Enlarged prostate with lower urinary tract symptoms: Secondary | ICD-10-CM

## 2021-01-18 NOTE — Progress Notes (Signed)
Subjective:    Patient ID: Evan Kirk, male    DOB: September 17, 1941, 79 y.o.   MRN: [id number]  HPI  Pt is a 80 yr old male with recent CIR admission for multifocal motor neuropathy - stage II penile pressure ulcer, Afib, Active recurrent renal carcinoma, seen at [place], [place] and [facility], large/retroperitoneal encasing infrarenal IVC/suspected occlusion, Acute L cerebral infarct- small, and hyponatremia,  And anxiety?.  Here for hospital f/u.   Was discharged 01/01/21 from CIR.   Doing much better than being in hospital.  Walking better- improving daily.  Using cane a little bit- for short distances-  Doesn't furniture walking- Tom told him not to!  Got rollator in car.   Gets 2nd infusion next Wednesday- gets through [place]- every 3 weeks through [place] x 1 year.   Using Buspar- not really less anxious.  Doesn't help much.  Has PTSD- so DOES get anxious.   Pain is now manageable- not anymore now, than was before was in hospital.   Marcha Solders comes 2x/week for H/H- PT has been 3x- OT Timmothy Sours)- didn't think he needed it.   Still using the cream for penile pressure ulcer- getting somewhat better- doesn't bug him as much.   Has BMs daily- not a lot at each time- senokot at lunch and 1 at night.  2 brown capsules- natural fiber- at night- works well.  Medium BMs each time.     Pain Inventory Average Pain 6 Pain Right Now 4 My pain is burning, dull and aching  LOCATION OF PAIN  Shoulder, hand, fingers, back, leg, ankle, toes BOWEL Number of stools per week: 7 Oral laxative use Yes  Type of laxative senna Enema or suppository use No  History of colostomy No  Incontinent No   BLADDER Normal In and out cath, frequency na Able to self cath na Bladder incontinence No  Frequent urination No  Leakage with coughing No  Difficulty starting stream No  Incomplete bladder emptying No    Mobility use a cane use a walker ability to climb steps?  yes do you drive?   yes  Function retired  Neuro/Psych dizziness anxiety  Prior Studies TC appt  Physicians involved in your care TC appt   Family History  Problem Relation Age of Onset  . Stroke Mother    Social History   Socioeconomic History  . Marital status: Married    Spouse name: Not on file  . Number of children: Not on file  . Years of education: Not on file  . Highest education level: Not on file  Occupational History  . Occupation: retired    Comment: Therapist, art x 5 years; Water engineer  Tobacco Use  . Smoking status: Former Smoker    Quit date: 04/18/1966    Years since quitting: 54.7  . Smokeless tobacco: Former Network engineer and Sexual Activity  . Alcohol use: No    Alcohol/week: 0.0 standard drinks  . Drug use: No  . Sexual activity: Not on file  Other Topics Concern  . Not on file  Social History Narrative  . Not on file   Social Determinants of Health   Financial Resource Strain: Not on file  Food Insecurity: Not on file  Transportation Needs: Not on file  Physical Activity: Not on file  Stress: Not on file  Social Connections: Not on file   Past Surgical History:  Procedure Laterality Date  . CYSTOSCOPY WITH BIOPSY N/A 10/09/2015   Procedure: CYSTOSCOPY WITH  BIOPSY;  Surgeon: Cleon Gustin, MD;  Location: [facility];  Service: Urology;  Laterality: N/A;  . NEPHRECTOMY Left   . NEPHRECTOMY RADICAL Left 08/13/2015   [facility]-  . TOTAL KNEE ARTHROPLASTY Right 11-10-2008  . TRANSTHORACIC ECHOCARDIOGRAM  08-12-2015      moderate concentric LVH, ef 50-55-%/  trivial MR , PR and TR/  severe LAE and RAE  . VASECTOMY REVERSAL  1978   Past Medical History:  Diagnosis Date  . Anticoagulated on Coumadin    followed by [place] [facility]  . Atrial fibrillation ([place])   . Back pain   . Bladder mass   . BPH (benign prostatic hypertrophy)   . Chronic atrial fibrillation ([place])   . Diastolic CHF, chronic [facility])    cardiologist-- dr Daneen Schick--  asymptomatic  . Diverticulosis of colon   . Edema   . Essential tremor 06-2015  . History of urinary retention   . HTN (hypertension)   . OSA on CPAP   . Peripheral neuropathy    BILATERAL FEET  . Prediabetes   . PTSD (post-traumatic stress disorder)   . Renal cell carcinoma of left kidney (HCC)    BP 117/66   Pulse 80   Temp (!) 97.2 F (36.2 C)   Ht 5\' 6"  (1.676 m)   SpO2 95%   BMI 34.59 kg/m   Opioid Risk Score:   Fall Risk Score:  `1  Depression screen PHQ 2/9  Depression screen PHQ 2/9 01/18/2021  Decreased Interest 0  Down, Depressed, Hopeless 1  PHQ - 2 Score 1  Altered sleeping 0  Tired, decreased energy 3  Change in appetite 0  Feeling bad or failure about yourself  1  Trouble concentrating 0  Moving slowly or fidgety/restless 0  Suicidal thoughts 1  PHQ-9 Score 6  Difficult doing work/chores Somewhat difficult    Review of Systems An entire ROS was completed and found to be negative except for HPI.     Objective:   Physical Exam  Awake, alert, accompanied by son, appropriate, using 4 pronged cane, NAD R knee severe crepitus- just grinding with ROM MS: UEs- deltoid, 4+/5, biceps 5-/5, triceps 5/5, grip 5-/5, finger abd 4+/5 LEs- HF 4+/5, KE 5-/5, KF 5-/5, DF 4+/5, and PF 5-/5  Neuro: Bottom of feet- feel decreased- chronic B/L Otherwise intact to light touch B/L in LEs and UEs Cannot get DTRs on patellae/achilles B/L- absent      Assessment & Plan:   Pt is a 80 yr old male with recent CIR admission for multifocal motor neuropathy - stage II penile pressure ulcer, Afib, Active recurrent renal carcinoma, seen at [place], [place] and [facility], large/retroperitoneal encasing infrarenal IVC/suspected occlusion, Acute L cerebral infarct- small, and hyponatremia,  And anxiety?.  Here for hospital f/u.    1. Suggest stopping Buspar- was taking for a few days- has stopped.   2.  Back to eating more salt- so thinks no reason to check Na- for  previous hyponatremia  3. HR is 70s- so afib controlled.   4. Lyrica is working for pain 25 mg BID- still takes tylenol occasionally x2 pills when necessary.   5. Continue with H/H- until they've discharged him.   6. F/U as needed- con't wound care for privates.    I spent a total of 20 minutes on visit- as detailed above.

## 2021-01-18 NOTE — Patient Instructions (Addendum)
Pt is a 80 yr old male with recent CIR admission for multifocal motor neuropathy - stage II penile pressure ulcer, Afib, Active recurrent renal carcinoma, seen at [place], [place] and [facility], large/retroperitoneal encasing infrarenal IVC/suspected occlusion, Acute L cerebral infarct- small, and hyponatremia,  And anxiety?.  Here for hospital f/u.    1. Suggest stopping Buspar- was taking for a few days- has stopped.   2.  Back to eating more salt- so thinks no reason to check Na- for previous hyponatremia  3. HR is 70s- so afib controlled.   4. Lyrica is working for pain 25 mg BID- still takes tylenol occasionally x2 pills when necessary.   5. Continue with H/H- until they've discharged him.   6. F/U as needed- con't wound care for privates

## 2021-03-15 ENCOUNTER — Ambulatory Visit: Payer: Medicare Other | Admitting: Diagnostic Neuroimaging

## 2021-07-19 ENCOUNTER — Ambulatory Visit: Payer: Medicare Other | Admitting: Podiatry

## 2021-07-19 ENCOUNTER — Other Ambulatory Visit: Payer: Self-pay

## 2021-07-19 DIAGNOSIS — B351 Tinea unguium: Secondary | ICD-10-CM | POA: Diagnosis not present

## 2021-07-19 DIAGNOSIS — M79675 Pain in left toe(s): Secondary | ICD-10-CM | POA: Diagnosis not present

## 2021-07-19 DIAGNOSIS — Z7901 Long term (current) use of anticoagulants: Secondary | ICD-10-CM

## 2021-07-19 DIAGNOSIS — L600 Ingrowing nail: Secondary | ICD-10-CM

## 2021-07-19 DIAGNOSIS — M792 Neuralgia and neuritis, unspecified: Secondary | ICD-10-CM | POA: Diagnosis not present

## 2021-07-21 NOTE — Progress Notes (Signed)
Subjective: 80 year old male presents the office today with concerns of pain to his feet he describes stinging, burning sensations in his feet feel tight.  States he has neuropathy and they feel hot at times.  Also asking for the nails be trimmed today as they do get thick and gets ingrowing on the big toenails.  No swelling or redness or drainage.  No open sores. Denies any systemic complaints such as fevers, chills, nausea, vomiting. No acute changes since last appointment, and no other complaints at this time.   He is on Eliquis  Objective: AAO x3, NAD DP/PT pulses palpable bilaterally, CRT less than 3 seconds Sensation decreased with Semmes Weinstein monofilament There is no area of pinpoint tenderness. Nails are hypertrophic, dystrophic, brittle, discolored, elongated 10.  Incurvation of the hallux toenails bilaterally.  No surrounding redness or drainage. Tenderness nails 1-5 bilaterally. No open lesions or pre-ulcerative lesions are identified today. No open lesions or pre-ulcerative lesions.  No pain with calf compression, swelling, warmth, erythema  Assessment: 80 year old male with neuropathy, symptomatic onychomycosis with ingrown toenail  Plan: -All treatment options discussed with the patient including all alternatives, risks, complications.  -Regards to the neuropathy we discussed different treatment options.  He was off any further oral medications.  I would refill the compound cream through [facility] for neuropathy. -Sharply debrided the nails x10 without any complications or bleeding.  Particular was able to debride the symptomatic portion ingrown toenails any complications.  If needed may need to proceed with partial nail avulsion. -Patient encouraged to call the office with any questions, concerns, change in symptoms.   Trula Slade DPM

## 2021-07-27 ENCOUNTER — Ambulatory Visit: Payer: Medicare Other | Admitting: Interventional Cardiology

## 2021-08-09 ENCOUNTER — Telehealth: Payer: Self-pay | Admitting: *Deleted

## 2021-08-09 NOTE — Telephone Encounter (Signed)
Patient's wife is calling because husband is having a possible gout flare up, foot and toes. Can a prescription be sent to pharmacy on file. Please schedule for reevaluation for gout pain(new problem).

## 2021-08-10 ENCOUNTER — Other Ambulatory Visit: Payer: Self-pay | Admitting: Podiatry

## 2021-08-10 MED ORDER — METHYLPREDNISOLONE 4 MG PO TBPK: ORAL_TABLET | ORAL | 0 refills | Status: AC

## 2021-08-10 NOTE — Telephone Encounter (Signed)
Patient called back. He states on his right foot >> left he is having pain to the big toe joint and on the top of the foot. No injury. He has had gout before and states it feels the same. I have sent medrol dose pack to the pharmacy for him and recommended for him to call the office in the morning for an appointment. Verbalized understanding.

## 2021-09-21 ENCOUNTER — Other Ambulatory Visit: Payer: Self-pay

## 2021-09-21 ENCOUNTER — Ambulatory Visit: Payer: Medicare Other | Admitting: Podiatry

## 2021-09-21 DIAGNOSIS — M792 Neuralgia and neuritis, unspecified: Secondary | ICD-10-CM | POA: Diagnosis not present

## 2021-09-21 DIAGNOSIS — M79675 Pain in left toe(s): Secondary | ICD-10-CM | POA: Diagnosis not present

## 2021-09-21 DIAGNOSIS — B351 Tinea unguium: Secondary | ICD-10-CM | POA: Diagnosis not present

## 2021-09-21 DIAGNOSIS — Z7901 Long term (current) use of anticoagulants: Secondary | ICD-10-CM | POA: Diagnosis not present

## 2021-09-21 DIAGNOSIS — M10071 Idiopathic gout, right ankle and foot: Secondary | ICD-10-CM | POA: Diagnosis not present

## 2021-09-26 NOTE — Progress Notes (Signed)
Subjective: 80 year old male presents the office today for follow-up evaluation of neuropathy as well as symptomatic onychomycosis.  He states that the nails do cause tenderness at times but denies any swelling or redness or any drainage.  Occasionally an ingrown toenail but denies any signs of infection.  No recent gout flares please asked about having medication on hand in case he gets gout.  No recent injury or changes.  Objective: AAO x3, NAD DP/PT pulses palpable bilaterally, CRT less than 3 seconds Protective sensation decreased with Simms Weinstein monofilament Nails are hypertrophic, dystrophic, brittle, discolored, elongated 10. No surrounding redness or drainage. Tenderness nails 1-5 bilaterally. No open lesions or pre-ulcerative lesions are identified today. There is no evidence of gout today. MMT 5/5, ROM WNL. No edema, erythema, increase in warmth to bilateral lower extremities.  No open lesions or pre-ulcerative lesions.  No pain with calf compression, swelling, warmth, erythema  Assessment: 80 year old male with symptomatic psychosis, neuropathy; history of gout  Plan: -All treatment options discussed with the patient including all alternatives, risks, complications.  -Sharply debrided the nails x10 without any complications or bleeding. -Continue topical medication for neuropathy -For gout, discussed hydration.  If symptoms reoccur to let me know.  -Patient encouraged to call the office with any questions, concerns, change in symptoms.   Trula Slade DPM

## 2021-12-22 ENCOUNTER — Ambulatory Visit: Payer: Medicare Other | Admitting: Podiatry

## 2021-12-23 ENCOUNTER — Ambulatory Visit: Payer: Medicare Other | Admitting: Podiatry

## 2022-01-04 ENCOUNTER — Ambulatory Visit: Payer: Medicare Other | Admitting: Podiatry

## 2022-03-18 DIAGNOSIS — L97811 Non-pressure chronic ulcer of other part of right lower leg limited to breakdown of skin: Secondary | ICD-10-CM | POA: Diagnosis not present

## 2022-09-19 ENCOUNTER — Telehealth: Payer: Self-pay

## 2022-09-19 NOTE — Patient Outreach (Signed)
  Care Coordination   09/19/2022 Name: ROMANI WILBON MRN: [id number] DOB: May 01, 1941   Care Coordination Outreach Attempts:  An unsuccessful telephone outreach was attempted today to offer the patient information about available care coordination services as a benefit of their health plan.   Follow Up Plan:  Additional outreach attempts will be made to offer the patient care coordination information and services.   Encounter Outcome:  No Answer  Care Coordination Interventions Activated:  No   Care Coordination Interventions:  No, not indicated    [place] [facility] [phone]

## 2022-09-28 ENCOUNTER — Telehealth: Payer: Self-pay

## 2022-09-28 NOTE — Patient Outreach (Signed)
  Care Coordination   09/28/2022 Name: Evan Kirk MRN: [id number] DOB: 09/04/1941   Care Coordination Outreach Attempts:  A second unsuccessful outreach was attempted today to offer the patient with information about available care coordination services as a benefit of their health plan.     Follow Up Plan:  Additional outreach attempts will be made to offer the patient care coordination information and services.   Encounter Outcome:  No Answer  Care Coordination Interventions Activated:  No   Care Coordination Interventions:  No, not indicated    [place] [facility] [phone]

## 2022-11-19 IMAGING — CT CT ANGIO HEAD
1 of 11 series · 5 of 33 positions shown · IV contrast (APPLIED)
Comparison: Head CT 12/15/2020

CLINICAL DATA: Weakness

EXAM:
CT ANGIOGRAPHY HEAD AND NECK
TECHNIQUE: Multidetector CT imaging of the head and neck was performed using
the standard protocol during bolus administration of intravenous
contrast. Multiplanar CT image reconstructions and MIPs were
obtained to evaluate the vascular anatomy. Carotid stenosis
measurements (when applicable) are obtained utilizing NASCET
criteria, using the distal internal carotid diameter as the
denominator.
CONTRAST:  75mL OMNIPAQUE IOHEXOL 350 MG/ML SOLN

[Series 11: ax thins · axial · 0.39mm/px · z∈[+1148,+1372]mm · 5 of 336 slices shown]
[im 56/336  soft-tissue]
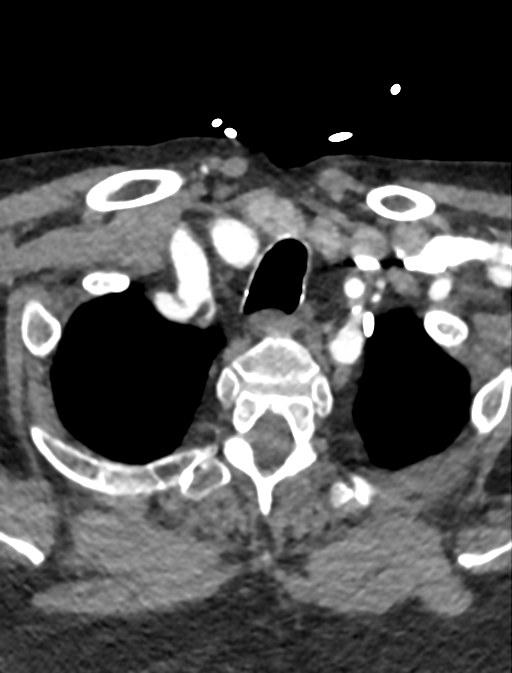
[im 112/336  bone]
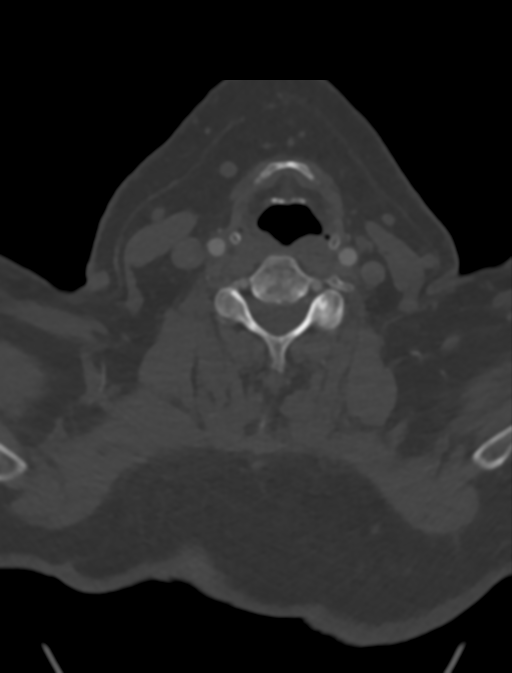
[im 168/336  soft-tissue]
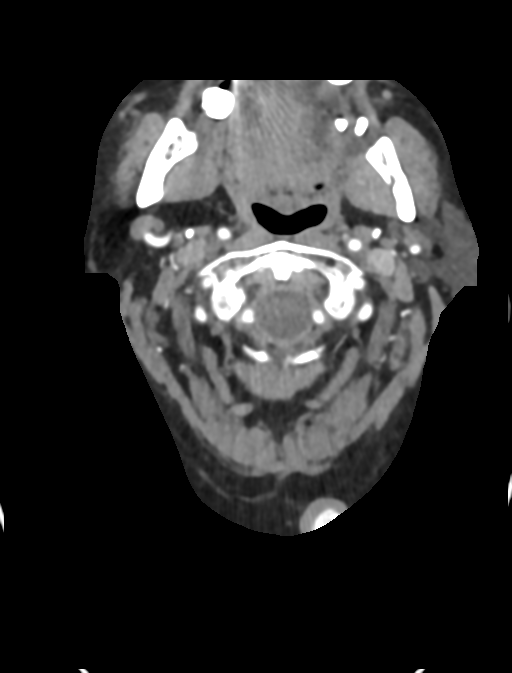
[im 224/336  bone]
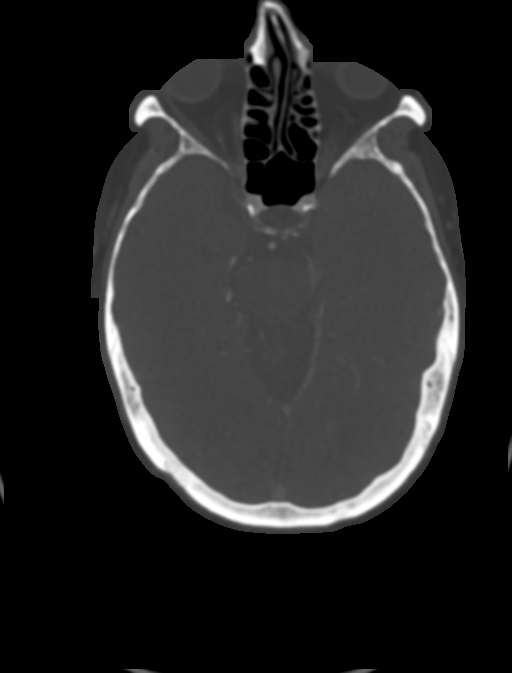
[im 280/336  soft-tissue]
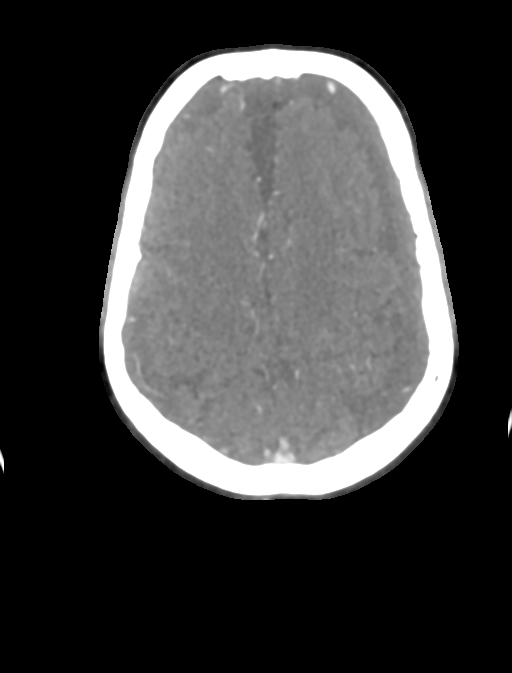

[5 of 33 positions shown; findings below may reference images not displayed]

FINDINGS: CT HEAD FINDINGS

Brain: There is no mass, hemorrhage or extra-axial collection. There
is generalized atrophy without lobar predilection. There is no acute
or chronic infarction. The brain parenchyma is normal.

Skull: The visualized skull base, calvarium and extracranial soft
tissues are normal.

Sinuses/Orbits: No fluid levels or advanced mucosal thickening of
the visualized paranasal sinuses. No mastoid or middle ear effusion.
The orbits are normal.

CTA NECK FINDINGS

SKELETON: There is no bony spinal canal stenosis. No lytic or
blastic lesion.

OTHER NECK: Normal pharynx, larynx and major salivary glands. No
cervical lymphadenopathy. Unremarkable thyroid gland.

UPPER CHEST: No pneumothorax or pleural effusion. No nodules or
masses.

AORTIC ARCH:

There is calcific atherosclerosis of the aortic arch. There is no
aneurysm, dissection or hemodynamically significant stenosis of the
visualized portion of the aorta. Conventional 3 vessel aortic
branching pattern. The visualized proximal subclavian arteries are
widely patent.

RIGHT CAROTID SYSTEM: No dissection, occlusion or aneurysm. Mild
atherosclerotic calcification at the carotid bifurcation without
hemodynamically significant stenosis.

LEFT CAROTID SYSTEM: No dissection, occlusion or aneurysm. Mild
atherosclerotic calcification at the carotid bifurcation without
hemodynamically significant stenosis.

VERTEBRAL ARTERIES: Left dominant configuration. Both origins are
clearly patent. There is no dissection, occlusion or flow-limiting
stenosis to the skull base (V1-V3 segments).

CTA HEAD FINDINGS

POSTERIOR CIRCULATION:

--Vertebral arteries: Normal V4 segments.

--Inferior cerebellar arteries: Normal.

--Basilar artery: Normal.

--Superior cerebellar arteries: Normal.

--Posterior cerebral arteries (PCA): Normal.

ANTERIOR CIRCULATION:

--Intracranial internal carotid arteries: Normal.

--Anterior cerebral arteries (ACA): Normal. Both A1 segments are
present. Patent anterior communicating artery (a-comm).

--Middle cerebral arteries (MCA): Normal.

VENOUS SINUSES: As permitted by contrast timing, patent.

ANATOMIC VARIANTS: None

Review of the MIP images confirms the above findings.
IMPRESSION: 1. No emergent large vessel occlusion or high-grade stenosis of the
head or neck.

Aortic Atherosclerosis ([id number]).

## 2022-12-17 DIAGNOSIS — R531 Weakness: Secondary | ICD-10-CM | POA: Diagnosis not present

## 2022-12-17 DIAGNOSIS — J1282 Pneumonia due to coronavirus disease 2019: Secondary | ICD-10-CM | POA: Diagnosis not present

## 2022-12-17 DIAGNOSIS — Z96651 Presence of right artificial knee joint: Secondary | ICD-10-CM | POA: Diagnosis not present

## 2022-12-17 DIAGNOSIS — L88 Pyoderma gangrenosum: Secondary | ICD-10-CM | POA: Diagnosis not present

## 2022-12-17 DIAGNOSIS — M5135 Other intervertebral disc degeneration, thoracolumbar region: Secondary | ICD-10-CM | POA: Diagnosis not present

## 2022-12-17 DIAGNOSIS — N390 Urinary tract infection, site not specified: Secondary | ICD-10-CM | POA: Diagnosis not present

## 2022-12-17 DIAGNOSIS — R609 Edema, unspecified: Secondary | ICD-10-CM | POA: Diagnosis not present

## 2022-12-17 DIAGNOSIS — R918 Other nonspecific abnormal finding of lung field: Secondary | ICD-10-CM | POA: Diagnosis not present

## 2022-12-17 DIAGNOSIS — I495 Sick sinus syndrome: Secondary | ICD-10-CM | POA: Diagnosis not present

## 2022-12-17 DIAGNOSIS — M48061 Spinal stenosis, lumbar region without neurogenic claudication: Secondary | ICD-10-CM | POA: Diagnosis not present

## 2022-12-17 DIAGNOSIS — M4804 Spinal stenosis, thoracic region: Secondary | ICD-10-CM | POA: Diagnosis not present

## 2022-12-17 DIAGNOSIS — R2689 Other abnormalities of gait and mobility: Secondary | ICD-10-CM | POA: Diagnosis not present

## 2022-12-17 DIAGNOSIS — R7989 Other specified abnormal findings of blood chemistry: Secondary | ICD-10-CM | POA: Diagnosis not present

## 2022-12-17 DIAGNOSIS — M1990 Unspecified osteoarthritis, unspecified site: Secondary | ICD-10-CM | POA: Diagnosis not present

## 2022-12-17 DIAGNOSIS — R5381 Other malaise: Secondary | ICD-10-CM | POA: Diagnosis not present

## 2022-12-17 DIAGNOSIS — G4733 Obstructive sleep apnea (adult) (pediatric): Secondary | ICD-10-CM | POA: Diagnosis not present

## 2022-12-17 DIAGNOSIS — G629 Polyneuropathy, unspecified: Secondary | ICD-10-CM | POA: Diagnosis not present

## 2022-12-17 DIAGNOSIS — J9601 Acute respiratory failure with hypoxia: Secondary | ICD-10-CM | POA: Diagnosis not present

## 2022-12-17 DIAGNOSIS — M4802 Spinal stenosis, cervical region: Secondary | ICD-10-CM | POA: Diagnosis not present

## 2022-12-17 DIAGNOSIS — I4891 Unspecified atrial fibrillation: Secondary | ICD-10-CM | POA: Diagnosis not present

## 2022-12-17 DIAGNOSIS — Z66 Do not resuscitate: Secondary | ICD-10-CM | POA: Diagnosis not present

## 2022-12-17 DIAGNOSIS — U071 COVID-19: Secondary | ICD-10-CM | POA: Diagnosis not present

## 2022-12-17 DIAGNOSIS — K5900 Constipation, unspecified: Secondary | ICD-10-CM | POA: Diagnosis not present

## 2022-12-19 DIAGNOSIS — R5381 Other malaise: Secondary | ICD-10-CM | POA: Diagnosis not present

## 2022-12-19 DIAGNOSIS — M48061 Spinal stenosis, lumbar region without neurogenic claudication: Secondary | ICD-10-CM | POA: Diagnosis not present

## 2022-12-19 DIAGNOSIS — Z66 Do not resuscitate: Secondary | ICD-10-CM | POA: Diagnosis not present

## 2022-12-19 DIAGNOSIS — M1990 Unspecified osteoarthritis, unspecified site: Secondary | ICD-10-CM | POA: Diagnosis not present

## 2022-12-19 DIAGNOSIS — K5900 Constipation, unspecified: Secondary | ICD-10-CM | POA: Diagnosis not present

## 2022-12-19 DIAGNOSIS — R531 Weakness: Secondary | ICD-10-CM | POA: Diagnosis not present

## 2022-12-19 DIAGNOSIS — M4804 Spinal stenosis, thoracic region: Secondary | ICD-10-CM | POA: Diagnosis not present

## 2022-12-19 DIAGNOSIS — G629 Polyneuropathy, unspecified: Secondary | ICD-10-CM | POA: Diagnosis not present

## 2022-12-19 DIAGNOSIS — G4733 Obstructive sleep apnea (adult) (pediatric): Secondary | ICD-10-CM | POA: Diagnosis not present

## 2022-12-19 DIAGNOSIS — Z96651 Presence of right artificial knee joint: Secondary | ICD-10-CM | POA: Diagnosis not present

## 2022-12-19 DIAGNOSIS — I495 Sick sinus syndrome: Secondary | ICD-10-CM | POA: Diagnosis not present

## 2022-12-19 DIAGNOSIS — N390 Urinary tract infection, site not specified: Secondary | ICD-10-CM | POA: Diagnosis not present

## 2022-12-19 DIAGNOSIS — M4802 Spinal stenosis, cervical region: Secondary | ICD-10-CM | POA: Diagnosis not present

## 2022-12-19 DIAGNOSIS — R2689 Other abnormalities of gait and mobility: Secondary | ICD-10-CM | POA: Diagnosis not present

## 2022-12-19 DIAGNOSIS — M5135 Other intervertebral disc degeneration, thoracolumbar region: Secondary | ICD-10-CM | POA: Diagnosis not present

## 2022-12-19 DIAGNOSIS — I4891 Unspecified atrial fibrillation: Secondary | ICD-10-CM | POA: Diagnosis not present

## 2022-12-19 DIAGNOSIS — J9601 Acute respiratory failure with hypoxia: Secondary | ICD-10-CM | POA: Diagnosis not present

## 2022-12-19 DIAGNOSIS — L88 Pyoderma gangrenosum: Secondary | ICD-10-CM | POA: Diagnosis not present

## 2022-12-19 DIAGNOSIS — U071 COVID-19: Secondary | ICD-10-CM | POA: Diagnosis not present

## 2022-12-19 DIAGNOSIS — R7989 Other specified abnormal findings of blood chemistry: Secondary | ICD-10-CM | POA: Diagnosis not present

## 2022-12-19 DIAGNOSIS — J1282 Pneumonia due to coronavirus disease 2019: Secondary | ICD-10-CM | POA: Diagnosis not present

## 2022-12-20 DIAGNOSIS — J9601 Acute respiratory failure with hypoxia: Secondary | ICD-10-CM | POA: Diagnosis not present

## 2022-12-20 DIAGNOSIS — R2689 Other abnormalities of gait and mobility: Secondary | ICD-10-CM | POA: Diagnosis not present

## 2022-12-20 DIAGNOSIS — L88 Pyoderma gangrenosum: Secondary | ICD-10-CM | POA: Diagnosis not present

## 2022-12-20 DIAGNOSIS — G629 Polyneuropathy, unspecified: Secondary | ICD-10-CM | POA: Diagnosis not present

## 2022-12-20 DIAGNOSIS — Z66 Do not resuscitate: Secondary | ICD-10-CM | POA: Diagnosis not present

## 2022-12-20 DIAGNOSIS — Z96651 Presence of right artificial knee joint: Secondary | ICD-10-CM | POA: Diagnosis not present

## 2022-12-20 DIAGNOSIS — R531 Weakness: Secondary | ICD-10-CM | POA: Diagnosis not present

## 2022-12-20 DIAGNOSIS — I4891 Unspecified atrial fibrillation: Secondary | ICD-10-CM | POA: Diagnosis not present

## 2022-12-20 DIAGNOSIS — N390 Urinary tract infection, site not specified: Secondary | ICD-10-CM | POA: Diagnosis not present

## 2022-12-20 DIAGNOSIS — K5900 Constipation, unspecified: Secondary | ICD-10-CM | POA: Diagnosis not present

## 2022-12-20 DIAGNOSIS — M4802 Spinal stenosis, cervical region: Secondary | ICD-10-CM | POA: Diagnosis not present

## 2022-12-20 DIAGNOSIS — G4733 Obstructive sleep apnea (adult) (pediatric): Secondary | ICD-10-CM | POA: Diagnosis not present

## 2022-12-20 DIAGNOSIS — I495 Sick sinus syndrome: Secondary | ICD-10-CM | POA: Diagnosis not present

## 2022-12-20 DIAGNOSIS — M1990 Unspecified osteoarthritis, unspecified site: Secondary | ICD-10-CM | POA: Diagnosis not present

## 2022-12-20 DIAGNOSIS — R5381 Other malaise: Secondary | ICD-10-CM | POA: Diagnosis not present

## 2022-12-20 DIAGNOSIS — M4804 Spinal stenosis, thoracic region: Secondary | ICD-10-CM | POA: Diagnosis not present

## 2022-12-20 DIAGNOSIS — R7989 Other specified abnormal findings of blood chemistry: Secondary | ICD-10-CM | POA: Diagnosis not present

## 2022-12-20 DIAGNOSIS — U071 COVID-19: Secondary | ICD-10-CM | POA: Diagnosis not present

## 2022-12-20 DIAGNOSIS — J1282 Pneumonia due to coronavirus disease 2019: Secondary | ICD-10-CM | POA: Diagnosis not present

## 2022-12-20 DIAGNOSIS — M48061 Spinal stenosis, lumbar region without neurogenic claudication: Secondary | ICD-10-CM | POA: Diagnosis not present

## 2022-12-20 DIAGNOSIS — M5135 Other intervertebral disc degeneration, thoracolumbar region: Secondary | ICD-10-CM | POA: Diagnosis not present

## 2022-12-21 DIAGNOSIS — R2689 Other abnormalities of gait and mobility: Secondary | ICD-10-CM | POA: Diagnosis not present

## 2022-12-21 DIAGNOSIS — M4804 Spinal stenosis, thoracic region: Secondary | ICD-10-CM | POA: Diagnosis not present

## 2022-12-21 DIAGNOSIS — J9601 Acute respiratory failure with hypoxia: Secondary | ICD-10-CM | POA: Diagnosis not present

## 2022-12-21 DIAGNOSIS — M1990 Unspecified osteoarthritis, unspecified site: Secondary | ICD-10-CM | POA: Diagnosis not present

## 2022-12-21 DIAGNOSIS — I4891 Unspecified atrial fibrillation: Secondary | ICD-10-CM | POA: Diagnosis not present

## 2022-12-21 DIAGNOSIS — I495 Sick sinus syndrome: Secondary | ICD-10-CM | POA: Diagnosis not present

## 2022-12-21 DIAGNOSIS — G4733 Obstructive sleep apnea (adult) (pediatric): Secondary | ICD-10-CM | POA: Diagnosis not present

## 2022-12-21 DIAGNOSIS — N390 Urinary tract infection, site not specified: Secondary | ICD-10-CM | POA: Diagnosis not present

## 2022-12-21 DIAGNOSIS — G629 Polyneuropathy, unspecified: Secondary | ICD-10-CM | POA: Diagnosis not present

## 2022-12-21 DIAGNOSIS — Z96651 Presence of right artificial knee joint: Secondary | ICD-10-CM | POA: Diagnosis not present

## 2022-12-21 DIAGNOSIS — R5381 Other malaise: Secondary | ICD-10-CM | POA: Diagnosis not present

## 2022-12-21 DIAGNOSIS — J1282 Pneumonia due to coronavirus disease 2019: Secondary | ICD-10-CM | POA: Diagnosis not present

## 2022-12-21 DIAGNOSIS — M5135 Other intervertebral disc degeneration, thoracolumbar region: Secondary | ICD-10-CM | POA: Diagnosis not present

## 2022-12-21 DIAGNOSIS — M48061 Spinal stenosis, lumbar region without neurogenic claudication: Secondary | ICD-10-CM | POA: Diagnosis not present

## 2022-12-21 DIAGNOSIS — R7989 Other specified abnormal findings of blood chemistry: Secondary | ICD-10-CM | POA: Diagnosis not present

## 2022-12-21 DIAGNOSIS — Z66 Do not resuscitate: Secondary | ICD-10-CM | POA: Diagnosis not present

## 2022-12-21 DIAGNOSIS — R531 Weakness: Secondary | ICD-10-CM | POA: Diagnosis not present

## 2022-12-21 DIAGNOSIS — L88 Pyoderma gangrenosum: Secondary | ICD-10-CM | POA: Diagnosis not present

## 2022-12-21 DIAGNOSIS — K5900 Constipation, unspecified: Secondary | ICD-10-CM | POA: Diagnosis not present

## 2022-12-21 DIAGNOSIS — U071 COVID-19: Secondary | ICD-10-CM | POA: Diagnosis not present

## 2022-12-21 DIAGNOSIS — M4802 Spinal stenosis, cervical region: Secondary | ICD-10-CM | POA: Diagnosis not present

## 2022-12-22 DIAGNOSIS — N390 Urinary tract infection, site not specified: Secondary | ICD-10-CM | POA: Diagnosis not present

## 2022-12-22 DIAGNOSIS — M5135 Other intervertebral disc degeneration, thoracolumbar region: Secondary | ICD-10-CM | POA: Diagnosis not present

## 2022-12-22 DIAGNOSIS — J1282 Pneumonia due to coronavirus disease 2019: Secondary | ICD-10-CM | POA: Diagnosis not present

## 2022-12-22 DIAGNOSIS — I495 Sick sinus syndrome: Secondary | ICD-10-CM | POA: Diagnosis not present

## 2022-12-22 DIAGNOSIS — Z66 Do not resuscitate: Secondary | ICD-10-CM | POA: Diagnosis not present

## 2022-12-22 DIAGNOSIS — R2689 Other abnormalities of gait and mobility: Secondary | ICD-10-CM | POA: Diagnosis not present

## 2022-12-22 DIAGNOSIS — M4802 Spinal stenosis, cervical region: Secondary | ICD-10-CM | POA: Diagnosis not present

## 2022-12-22 DIAGNOSIS — I4891 Unspecified atrial fibrillation: Secondary | ICD-10-CM | POA: Diagnosis not present

## 2022-12-22 DIAGNOSIS — G629 Polyneuropathy, unspecified: Secondary | ICD-10-CM | POA: Diagnosis not present

## 2022-12-22 DIAGNOSIS — M1990 Unspecified osteoarthritis, unspecified site: Secondary | ICD-10-CM | POA: Diagnosis not present

## 2022-12-22 DIAGNOSIS — G4733 Obstructive sleep apnea (adult) (pediatric): Secondary | ICD-10-CM | POA: Diagnosis not present

## 2022-12-22 DIAGNOSIS — L88 Pyoderma gangrenosum: Secondary | ICD-10-CM | POA: Diagnosis not present

## 2022-12-22 DIAGNOSIS — M4804 Spinal stenosis, thoracic region: Secondary | ICD-10-CM | POA: Diagnosis not present

## 2022-12-22 DIAGNOSIS — J9601 Acute respiratory failure with hypoxia: Secondary | ICD-10-CM | POA: Diagnosis not present

## 2022-12-22 DIAGNOSIS — U071 COVID-19: Secondary | ICD-10-CM | POA: Diagnosis not present

## 2022-12-22 DIAGNOSIS — M48061 Spinal stenosis, lumbar region without neurogenic claudication: Secondary | ICD-10-CM | POA: Diagnosis not present

## 2022-12-22 DIAGNOSIS — R7989 Other specified abnormal findings of blood chemistry: Secondary | ICD-10-CM | POA: Diagnosis not present

## 2022-12-22 DIAGNOSIS — R531 Weakness: Secondary | ICD-10-CM | POA: Diagnosis not present

## 2022-12-22 DIAGNOSIS — K5900 Constipation, unspecified: Secondary | ICD-10-CM | POA: Diagnosis not present

## 2022-12-22 DIAGNOSIS — R5381 Other malaise: Secondary | ICD-10-CM | POA: Diagnosis not present

## 2022-12-22 DIAGNOSIS — Z96651 Presence of right artificial knee joint: Secondary | ICD-10-CM | POA: Diagnosis not present

## 2022-12-23 DIAGNOSIS — U071 COVID-19: Secondary | ICD-10-CM | POA: Diagnosis not present

## 2022-12-23 DIAGNOSIS — M48061 Spinal stenosis, lumbar region without neurogenic claudication: Secondary | ICD-10-CM | POA: Diagnosis not present

## 2022-12-23 DIAGNOSIS — G629 Polyneuropathy, unspecified: Secondary | ICD-10-CM | POA: Diagnosis not present

## 2022-12-23 DIAGNOSIS — Z96651 Presence of right artificial knee joint: Secondary | ICD-10-CM | POA: Diagnosis not present

## 2022-12-23 DIAGNOSIS — N390 Urinary tract infection, site not specified: Secondary | ICD-10-CM | POA: Diagnosis not present

## 2022-12-23 DIAGNOSIS — M5135 Other intervertebral disc degeneration, thoracolumbar region: Secondary | ICD-10-CM | POA: Diagnosis not present

## 2022-12-23 DIAGNOSIS — K5900 Constipation, unspecified: Secondary | ICD-10-CM | POA: Diagnosis not present

## 2022-12-23 DIAGNOSIS — L88 Pyoderma gangrenosum: Secondary | ICD-10-CM | POA: Diagnosis not present

## 2022-12-23 DIAGNOSIS — M4802 Spinal stenosis, cervical region: Secondary | ICD-10-CM | POA: Diagnosis not present

## 2022-12-23 DIAGNOSIS — Z66 Do not resuscitate: Secondary | ICD-10-CM | POA: Diagnosis not present

## 2022-12-23 DIAGNOSIS — R7989 Other specified abnormal findings of blood chemistry: Secondary | ICD-10-CM | POA: Diagnosis not present

## 2022-12-23 DIAGNOSIS — I495 Sick sinus syndrome: Secondary | ICD-10-CM | POA: Diagnosis not present

## 2022-12-23 DIAGNOSIS — I4891 Unspecified atrial fibrillation: Secondary | ICD-10-CM | POA: Diagnosis not present

## 2022-12-23 DIAGNOSIS — R5381 Other malaise: Secondary | ICD-10-CM | POA: Diagnosis not present

## 2022-12-23 DIAGNOSIS — J1282 Pneumonia due to coronavirus disease 2019: Secondary | ICD-10-CM | POA: Diagnosis not present

## 2022-12-23 DIAGNOSIS — R2689 Other abnormalities of gait and mobility: Secondary | ICD-10-CM | POA: Diagnosis not present

## 2022-12-23 DIAGNOSIS — M1990 Unspecified osteoarthritis, unspecified site: Secondary | ICD-10-CM | POA: Diagnosis not present

## 2022-12-23 DIAGNOSIS — J9601 Acute respiratory failure with hypoxia: Secondary | ICD-10-CM | POA: Diagnosis not present

## 2022-12-23 DIAGNOSIS — M4804 Spinal stenosis, thoracic region: Secondary | ICD-10-CM | POA: Diagnosis not present

## 2022-12-23 DIAGNOSIS — G4733 Obstructive sleep apnea (adult) (pediatric): Secondary | ICD-10-CM | POA: Diagnosis not present

## 2022-12-23 DIAGNOSIS — R531 Weakness: Secondary | ICD-10-CM | POA: Diagnosis not present

## 2022-12-24 DIAGNOSIS — I495 Sick sinus syndrome: Secondary | ICD-10-CM | POA: Diagnosis not present

## 2022-12-24 DIAGNOSIS — N390 Urinary tract infection, site not specified: Secondary | ICD-10-CM | POA: Diagnosis not present

## 2022-12-24 DIAGNOSIS — R5381 Other malaise: Secondary | ICD-10-CM | POA: Diagnosis not present

## 2022-12-24 DIAGNOSIS — J9601 Acute respiratory failure with hypoxia: Secondary | ICD-10-CM | POA: Diagnosis not present

## 2022-12-24 DIAGNOSIS — G4733 Obstructive sleep apnea (adult) (pediatric): Secondary | ICD-10-CM | POA: Diagnosis not present

## 2022-12-24 DIAGNOSIS — L88 Pyoderma gangrenosum: Secondary | ICD-10-CM | POA: Diagnosis not present

## 2022-12-24 DIAGNOSIS — M48061 Spinal stenosis, lumbar region without neurogenic claudication: Secondary | ICD-10-CM | POA: Diagnosis not present

## 2022-12-24 DIAGNOSIS — R2689 Other abnormalities of gait and mobility: Secondary | ICD-10-CM | POA: Diagnosis not present

## 2022-12-24 DIAGNOSIS — U071 COVID-19: Secondary | ICD-10-CM | POA: Diagnosis not present

## 2022-12-24 DIAGNOSIS — M4802 Spinal stenosis, cervical region: Secondary | ICD-10-CM | POA: Diagnosis not present

## 2022-12-24 DIAGNOSIS — M5135 Other intervertebral disc degeneration, thoracolumbar region: Secondary | ICD-10-CM | POA: Diagnosis not present

## 2022-12-24 DIAGNOSIS — R531 Weakness: Secondary | ICD-10-CM | POA: Diagnosis not present

## 2022-12-24 DIAGNOSIS — M1990 Unspecified osteoarthritis, unspecified site: Secondary | ICD-10-CM | POA: Diagnosis not present

## 2022-12-24 DIAGNOSIS — J1282 Pneumonia due to coronavirus disease 2019: Secondary | ICD-10-CM | POA: Diagnosis not present

## 2022-12-24 DIAGNOSIS — G629 Polyneuropathy, unspecified: Secondary | ICD-10-CM | POA: Diagnosis not present

## 2022-12-24 DIAGNOSIS — M4804 Spinal stenosis, thoracic region: Secondary | ICD-10-CM | POA: Diagnosis not present

## 2022-12-24 DIAGNOSIS — Z96651 Presence of right artificial knee joint: Secondary | ICD-10-CM | POA: Diagnosis not present

## 2022-12-24 DIAGNOSIS — K5900 Constipation, unspecified: Secondary | ICD-10-CM | POA: Diagnosis not present

## 2022-12-24 DIAGNOSIS — I4891 Unspecified atrial fibrillation: Secondary | ICD-10-CM | POA: Diagnosis not present

## 2022-12-24 DIAGNOSIS — R7989 Other specified abnormal findings of blood chemistry: Secondary | ICD-10-CM | POA: Diagnosis not present

## 2022-12-24 DIAGNOSIS — Z66 Do not resuscitate: Secondary | ICD-10-CM | POA: Diagnosis not present

## 2022-12-25 DIAGNOSIS — M1990 Unspecified osteoarthritis, unspecified site: Secondary | ICD-10-CM | POA: Diagnosis not present

## 2022-12-25 DIAGNOSIS — U071 COVID-19: Secondary | ICD-10-CM | POA: Diagnosis not present

## 2022-12-25 DIAGNOSIS — R5381 Other malaise: Secondary | ICD-10-CM | POA: Diagnosis not present

## 2022-12-25 DIAGNOSIS — M4804 Spinal stenosis, thoracic region: Secondary | ICD-10-CM | POA: Diagnosis not present

## 2022-12-25 DIAGNOSIS — L88 Pyoderma gangrenosum: Secondary | ICD-10-CM | POA: Diagnosis not present

## 2022-12-25 DIAGNOSIS — M48061 Spinal stenosis, lumbar region without neurogenic claudication: Secondary | ICD-10-CM | POA: Diagnosis not present

## 2022-12-25 DIAGNOSIS — J9601 Acute respiratory failure with hypoxia: Secondary | ICD-10-CM | POA: Diagnosis not present

## 2022-12-25 DIAGNOSIS — R531 Weakness: Secondary | ICD-10-CM | POA: Diagnosis not present

## 2022-12-25 DIAGNOSIS — K5900 Constipation, unspecified: Secondary | ICD-10-CM | POA: Diagnosis not present

## 2022-12-25 DIAGNOSIS — I495 Sick sinus syndrome: Secondary | ICD-10-CM | POA: Diagnosis not present

## 2022-12-25 DIAGNOSIS — I4891 Unspecified atrial fibrillation: Secondary | ICD-10-CM | POA: Diagnosis not present

## 2022-12-25 DIAGNOSIS — G4733 Obstructive sleep apnea (adult) (pediatric): Secondary | ICD-10-CM | POA: Diagnosis not present

## 2022-12-25 DIAGNOSIS — G629 Polyneuropathy, unspecified: Secondary | ICD-10-CM | POA: Diagnosis not present

## 2022-12-25 DIAGNOSIS — R2689 Other abnormalities of gait and mobility: Secondary | ICD-10-CM | POA: Diagnosis not present

## 2022-12-25 DIAGNOSIS — M5135 Other intervertebral disc degeneration, thoracolumbar region: Secondary | ICD-10-CM | POA: Diagnosis not present

## 2022-12-25 DIAGNOSIS — M4802 Spinal stenosis, cervical region: Secondary | ICD-10-CM | POA: Diagnosis not present

## 2022-12-25 DIAGNOSIS — R7989 Other specified abnormal findings of blood chemistry: Secondary | ICD-10-CM | POA: Diagnosis not present

## 2022-12-25 DIAGNOSIS — J1282 Pneumonia due to coronavirus disease 2019: Secondary | ICD-10-CM | POA: Diagnosis not present

## 2022-12-25 DIAGNOSIS — N390 Urinary tract infection, site not specified: Secondary | ICD-10-CM | POA: Diagnosis not present

## 2022-12-25 DIAGNOSIS — Z96651 Presence of right artificial knee joint: Secondary | ICD-10-CM | POA: Diagnosis not present

## 2022-12-25 DIAGNOSIS — Z66 Do not resuscitate: Secondary | ICD-10-CM | POA: Diagnosis not present

## 2022-12-26 DIAGNOSIS — G629 Polyneuropathy, unspecified: Secondary | ICD-10-CM | POA: Diagnosis not present

## 2022-12-26 DIAGNOSIS — R7989 Other specified abnormal findings of blood chemistry: Secondary | ICD-10-CM | POA: Diagnosis not present

## 2022-12-26 DIAGNOSIS — Z66 Do not resuscitate: Secondary | ICD-10-CM | POA: Diagnosis not present

## 2022-12-26 DIAGNOSIS — R531 Weakness: Secondary | ICD-10-CM | POA: Diagnosis not present

## 2022-12-26 DIAGNOSIS — G4733 Obstructive sleep apnea (adult) (pediatric): Secondary | ICD-10-CM | POA: Diagnosis not present

## 2022-12-26 DIAGNOSIS — U071 COVID-19: Secondary | ICD-10-CM | POA: Diagnosis not present

## 2022-12-26 DIAGNOSIS — J1282 Pneumonia due to coronavirus disease 2019: Secondary | ICD-10-CM | POA: Diagnosis not present

## 2022-12-26 DIAGNOSIS — M4802 Spinal stenosis, cervical region: Secondary | ICD-10-CM | POA: Diagnosis not present

## 2022-12-26 DIAGNOSIS — M1990 Unspecified osteoarthritis, unspecified site: Secondary | ICD-10-CM | POA: Diagnosis not present

## 2022-12-26 DIAGNOSIS — M5135 Other intervertebral disc degeneration, thoracolumbar region: Secondary | ICD-10-CM | POA: Diagnosis not present

## 2022-12-26 DIAGNOSIS — L88 Pyoderma gangrenosum: Secondary | ICD-10-CM | POA: Diagnosis not present

## 2022-12-26 DIAGNOSIS — I495 Sick sinus syndrome: Secondary | ICD-10-CM | POA: Diagnosis not present

## 2022-12-26 DIAGNOSIS — J9601 Acute respiratory failure with hypoxia: Secondary | ICD-10-CM | POA: Diagnosis not present

## 2022-12-26 DIAGNOSIS — M4804 Spinal stenosis, thoracic region: Secondary | ICD-10-CM | POA: Diagnosis not present

## 2022-12-26 DIAGNOSIS — N390 Urinary tract infection, site not specified: Secondary | ICD-10-CM | POA: Diagnosis not present

## 2022-12-26 DIAGNOSIS — R5381 Other malaise: Secondary | ICD-10-CM | POA: Diagnosis not present

## 2022-12-26 DIAGNOSIS — M48061 Spinal stenosis, lumbar region without neurogenic claudication: Secondary | ICD-10-CM | POA: Diagnosis not present

## 2022-12-26 DIAGNOSIS — I4891 Unspecified atrial fibrillation: Secondary | ICD-10-CM | POA: Diagnosis not present

## 2022-12-26 DIAGNOSIS — K5900 Constipation, unspecified: Secondary | ICD-10-CM | POA: Diagnosis not present

## 2022-12-26 DIAGNOSIS — R2689 Other abnormalities of gait and mobility: Secondary | ICD-10-CM | POA: Diagnosis not present

## 2022-12-26 DIAGNOSIS — Z96651 Presence of right artificial knee joint: Secondary | ICD-10-CM | POA: Diagnosis not present

## 2022-12-27 DIAGNOSIS — M4804 Spinal stenosis, thoracic region: Secondary | ICD-10-CM | POA: Diagnosis not present

## 2022-12-27 DIAGNOSIS — J9601 Acute respiratory failure with hypoxia: Secondary | ICD-10-CM | POA: Diagnosis not present

## 2022-12-27 DIAGNOSIS — K5900 Constipation, unspecified: Secondary | ICD-10-CM | POA: Diagnosis not present

## 2022-12-27 DIAGNOSIS — G4733 Obstructive sleep apnea (adult) (pediatric): Secondary | ICD-10-CM | POA: Diagnosis not present

## 2022-12-27 DIAGNOSIS — Z66 Do not resuscitate: Secondary | ICD-10-CM | POA: Diagnosis not present

## 2022-12-27 DIAGNOSIS — G629 Polyneuropathy, unspecified: Secondary | ICD-10-CM | POA: Diagnosis not present

## 2022-12-27 DIAGNOSIS — R2689 Other abnormalities of gait and mobility: Secondary | ICD-10-CM | POA: Diagnosis not present

## 2022-12-27 DIAGNOSIS — N390 Urinary tract infection, site not specified: Secondary | ICD-10-CM | POA: Diagnosis not present

## 2022-12-27 DIAGNOSIS — M5135 Other intervertebral disc degeneration, thoracolumbar region: Secondary | ICD-10-CM | POA: Diagnosis not present

## 2022-12-27 DIAGNOSIS — R7989 Other specified abnormal findings of blood chemistry: Secondary | ICD-10-CM | POA: Diagnosis not present

## 2022-12-27 DIAGNOSIS — I4891 Unspecified atrial fibrillation: Secondary | ICD-10-CM | POA: Diagnosis not present

## 2022-12-27 DIAGNOSIS — L88 Pyoderma gangrenosum: Secondary | ICD-10-CM | POA: Diagnosis not present

## 2022-12-27 DIAGNOSIS — Z96651 Presence of right artificial knee joint: Secondary | ICD-10-CM | POA: Diagnosis not present

## 2022-12-27 DIAGNOSIS — J1282 Pneumonia due to coronavirus disease 2019: Secondary | ICD-10-CM | POA: Diagnosis not present

## 2022-12-27 DIAGNOSIS — M48061 Spinal stenosis, lumbar region without neurogenic claudication: Secondary | ICD-10-CM | POA: Diagnosis not present

## 2022-12-27 DIAGNOSIS — R5381 Other malaise: Secondary | ICD-10-CM | POA: Diagnosis not present

## 2022-12-27 DIAGNOSIS — R531 Weakness: Secondary | ICD-10-CM | POA: Diagnosis not present

## 2022-12-27 DIAGNOSIS — U071 COVID-19: Secondary | ICD-10-CM | POA: Diagnosis not present

## 2022-12-27 DIAGNOSIS — M4802 Spinal stenosis, cervical region: Secondary | ICD-10-CM | POA: Diagnosis not present

## 2022-12-27 DIAGNOSIS — I495 Sick sinus syndrome: Secondary | ICD-10-CM | POA: Diagnosis not present

## 2022-12-27 DIAGNOSIS — M1990 Unspecified osteoarthritis, unspecified site: Secondary | ICD-10-CM | POA: Diagnosis not present

## 2022-12-28 DIAGNOSIS — K5900 Constipation, unspecified: Secondary | ICD-10-CM | POA: Diagnosis not present

## 2022-12-28 DIAGNOSIS — M1990 Unspecified osteoarthritis, unspecified site: Secondary | ICD-10-CM | POA: Diagnosis not present

## 2022-12-28 DIAGNOSIS — R7989 Other specified abnormal findings of blood chemistry: Secondary | ICD-10-CM | POA: Diagnosis not present

## 2022-12-28 DIAGNOSIS — M4804 Spinal stenosis, thoracic region: Secondary | ICD-10-CM | POA: Diagnosis not present

## 2022-12-28 DIAGNOSIS — N390 Urinary tract infection, site not specified: Secondary | ICD-10-CM | POA: Diagnosis not present

## 2022-12-28 DIAGNOSIS — R5381 Other malaise: Secondary | ICD-10-CM | POA: Diagnosis not present

## 2022-12-28 DIAGNOSIS — I4891 Unspecified atrial fibrillation: Secondary | ICD-10-CM | POA: Diagnosis not present

## 2022-12-28 DIAGNOSIS — M48061 Spinal stenosis, lumbar region without neurogenic claudication: Secondary | ICD-10-CM | POA: Diagnosis not present

## 2022-12-28 DIAGNOSIS — M5135 Other intervertebral disc degeneration, thoracolumbar region: Secondary | ICD-10-CM | POA: Diagnosis not present

## 2022-12-28 DIAGNOSIS — Z66 Do not resuscitate: Secondary | ICD-10-CM | POA: Diagnosis not present

## 2022-12-28 DIAGNOSIS — G4733 Obstructive sleep apnea (adult) (pediatric): Secondary | ICD-10-CM | POA: Diagnosis not present

## 2022-12-28 DIAGNOSIS — J1282 Pneumonia due to coronavirus disease 2019: Secondary | ICD-10-CM | POA: Diagnosis not present

## 2022-12-28 DIAGNOSIS — Z96651 Presence of right artificial knee joint: Secondary | ICD-10-CM | POA: Diagnosis not present

## 2022-12-28 DIAGNOSIS — G629 Polyneuropathy, unspecified: Secondary | ICD-10-CM | POA: Diagnosis not present

## 2022-12-28 DIAGNOSIS — M4802 Spinal stenosis, cervical region: Secondary | ICD-10-CM | POA: Diagnosis not present

## 2022-12-28 DIAGNOSIS — U071 COVID-19: Secondary | ICD-10-CM | POA: Diagnosis not present

## 2022-12-28 DIAGNOSIS — J9601 Acute respiratory failure with hypoxia: Secondary | ICD-10-CM | POA: Diagnosis not present

## 2022-12-28 DIAGNOSIS — I495 Sick sinus syndrome: Secondary | ICD-10-CM | POA: Diagnosis not present

## 2022-12-28 DIAGNOSIS — R531 Weakness: Secondary | ICD-10-CM | POA: Diagnosis not present

## 2022-12-28 DIAGNOSIS — R2689 Other abnormalities of gait and mobility: Secondary | ICD-10-CM | POA: Diagnosis not present

## 2022-12-28 DIAGNOSIS — L88 Pyoderma gangrenosum: Secondary | ICD-10-CM | POA: Diagnosis not present

## 2022-12-29 DIAGNOSIS — U071 COVID-19: Secondary | ICD-10-CM | POA: Diagnosis not present

## 2022-12-29 DIAGNOSIS — N390 Urinary tract infection, site not specified: Secondary | ICD-10-CM | POA: Diagnosis not present

## 2022-12-29 DIAGNOSIS — I495 Sick sinus syndrome: Secondary | ICD-10-CM | POA: Diagnosis not present

## 2022-12-29 DIAGNOSIS — G629 Polyneuropathy, unspecified: Secondary | ICD-10-CM | POA: Diagnosis not present

## 2022-12-29 DIAGNOSIS — Z96651 Presence of right artificial knee joint: Secondary | ICD-10-CM | POA: Diagnosis not present

## 2022-12-29 DIAGNOSIS — J9601 Acute respiratory failure with hypoxia: Secondary | ICD-10-CM | POA: Diagnosis not present

## 2022-12-29 DIAGNOSIS — M5135 Other intervertebral disc degeneration, thoracolumbar region: Secondary | ICD-10-CM | POA: Diagnosis not present

## 2022-12-29 DIAGNOSIS — M1990 Unspecified osteoarthritis, unspecified site: Secondary | ICD-10-CM | POA: Diagnosis not present

## 2022-12-29 DIAGNOSIS — R531 Weakness: Secondary | ICD-10-CM | POA: Diagnosis not present

## 2022-12-29 DIAGNOSIS — M48061 Spinal stenosis, lumbar region without neurogenic claudication: Secondary | ICD-10-CM | POA: Diagnosis not present

## 2022-12-29 DIAGNOSIS — J1282 Pneumonia due to coronavirus disease 2019: Secondary | ICD-10-CM | POA: Diagnosis not present

## 2022-12-29 DIAGNOSIS — M4802 Spinal stenosis, cervical region: Secondary | ICD-10-CM | POA: Diagnosis not present

## 2022-12-29 DIAGNOSIS — R2689 Other abnormalities of gait and mobility: Secondary | ICD-10-CM | POA: Diagnosis not present

## 2022-12-29 DIAGNOSIS — G4733 Obstructive sleep apnea (adult) (pediatric): Secondary | ICD-10-CM | POA: Diagnosis not present

## 2022-12-29 DIAGNOSIS — I4891 Unspecified atrial fibrillation: Secondary | ICD-10-CM | POA: Diagnosis not present

## 2022-12-29 DIAGNOSIS — R5381 Other malaise: Secondary | ICD-10-CM | POA: Diagnosis not present

## 2022-12-29 DIAGNOSIS — K5900 Constipation, unspecified: Secondary | ICD-10-CM | POA: Diagnosis not present

## 2022-12-29 DIAGNOSIS — M4804 Spinal stenosis, thoracic region: Secondary | ICD-10-CM | POA: Diagnosis not present

## 2022-12-29 DIAGNOSIS — L88 Pyoderma gangrenosum: Secondary | ICD-10-CM | POA: Diagnosis not present

## 2022-12-29 DIAGNOSIS — Z66 Do not resuscitate: Secondary | ICD-10-CM | POA: Diagnosis not present

## 2022-12-29 DIAGNOSIS — R7989 Other specified abnormal findings of blood chemistry: Secondary | ICD-10-CM | POA: Diagnosis not present

## 2022-12-30 DIAGNOSIS — I495 Sick sinus syndrome: Secondary | ICD-10-CM | POA: Diagnosis not present

## 2022-12-30 DIAGNOSIS — Z96651 Presence of right artificial knee joint: Secondary | ICD-10-CM | POA: Diagnosis not present

## 2022-12-30 DIAGNOSIS — M1990 Unspecified osteoarthritis, unspecified site: Secondary | ICD-10-CM | POA: Diagnosis not present

## 2022-12-30 DIAGNOSIS — I4891 Unspecified atrial fibrillation: Secondary | ICD-10-CM | POA: Diagnosis not present

## 2022-12-30 DIAGNOSIS — M19032 Primary osteoarthritis, left wrist: Secondary | ICD-10-CM | POA: Diagnosis not present

## 2022-12-30 DIAGNOSIS — R1312 Dysphagia, oropharyngeal phase: Secondary | ICD-10-CM | POA: Diagnosis not present

## 2022-12-30 DIAGNOSIS — I517 Cardiomegaly: Secondary | ICD-10-CM | POA: Diagnosis not present

## 2022-12-30 DIAGNOSIS — Z515 Encounter for palliative care: Secondary | ICD-10-CM | POA: Diagnosis not present

## 2022-12-30 DIAGNOSIS — N390 Urinary tract infection, site not specified: Secondary | ICD-10-CM | POA: Diagnosis not present

## 2022-12-30 DIAGNOSIS — M4802 Spinal stenosis, cervical region: Secondary | ICD-10-CM | POA: Diagnosis not present

## 2022-12-30 DIAGNOSIS — M47812 Spondylosis without myelopathy or radiculopathy, cervical region: Secondary | ICD-10-CM | POA: Diagnosis not present

## 2022-12-30 DIAGNOSIS — Z950 Presence of cardiac pacemaker: Secondary | ICD-10-CM | POA: Diagnosis not present

## 2022-12-30 DIAGNOSIS — R001 Bradycardia, unspecified: Secondary | ICD-10-CM | POA: Diagnosis not present

## 2022-12-30 DIAGNOSIS — R2681 Unsteadiness on feet: Secondary | ICD-10-CM | POA: Diagnosis not present

## 2022-12-30 DIAGNOSIS — M4184 Other forms of scoliosis, thoracic region: Secondary | ICD-10-CM | POA: Diagnosis not present

## 2022-12-30 DIAGNOSIS — I480 Paroxysmal atrial fibrillation: Secondary | ICD-10-CM | POA: Diagnosis not present

## 2022-12-30 DIAGNOSIS — M5031 Other cervical disc degeneration,  high cervical region: Secondary | ICD-10-CM | POA: Diagnosis not present

## 2022-12-30 DIAGNOSIS — J9811 Atelectasis: Secondary | ICD-10-CM | POA: Diagnosis not present

## 2022-12-30 DIAGNOSIS — I4811 Longstanding persistent atrial fibrillation: Secondary | ICD-10-CM | POA: Diagnosis not present

## 2022-12-30 DIAGNOSIS — R531 Weakness: Secondary | ICD-10-CM | POA: Diagnosis not present

## 2022-12-30 DIAGNOSIS — R2689 Other abnormalities of gait and mobility: Secondary | ICD-10-CM | POA: Diagnosis not present

## 2022-12-30 DIAGNOSIS — M50321 Other cervical disc degeneration at C4-C5 level: Secondary | ICD-10-CM | POA: Diagnosis not present

## 2022-12-30 DIAGNOSIS — Z7189 Other specified counseling: Secondary | ICD-10-CM | POA: Diagnosis not present

## 2022-12-30 DIAGNOSIS — U071 COVID-19: Secondary | ICD-10-CM | POA: Diagnosis not present

## 2022-12-30 DIAGNOSIS — I080 Rheumatic disorders of both mitral and aortic valves: Secondary | ICD-10-CM | POA: Diagnosis not present

## 2022-12-30 DIAGNOSIS — R7989 Other specified abnormal findings of blood chemistry: Secondary | ICD-10-CM | POA: Diagnosis not present

## 2022-12-30 DIAGNOSIS — M5134 Other intervertebral disc degeneration, thoracic region: Secondary | ICD-10-CM | POA: Diagnosis not present

## 2022-12-30 DIAGNOSIS — J1282 Pneumonia due to coronavirus disease 2019: Secondary | ICD-10-CM | POA: Diagnosis not present

## 2022-12-30 DIAGNOSIS — C7902 Secondary malignant neoplasm of left kidney and renal pelvis: Secondary | ICD-10-CM | POA: Diagnosis not present

## 2022-12-30 DIAGNOSIS — M5135 Other intervertebral disc degeneration, thoracolumbar region: Secondary | ICD-10-CM | POA: Diagnosis not present

## 2022-12-30 DIAGNOSIS — M5136 Other intervertebral disc degeneration, lumbar region: Secondary | ICD-10-CM | POA: Diagnosis not present

## 2022-12-30 DIAGNOSIS — J9601 Acute respiratory failure with hypoxia: Secondary | ICD-10-CM | POA: Diagnosis not present

## 2022-12-30 DIAGNOSIS — G629 Polyneuropathy, unspecified: Secondary | ICD-10-CM | POA: Diagnosis not present

## 2022-12-30 DIAGNOSIS — M4804 Spinal stenosis, thoracic region: Secondary | ICD-10-CM | POA: Diagnosis not present

## 2022-12-30 DIAGNOSIS — K5900 Constipation, unspecified: Secondary | ICD-10-CM | POA: Diagnosis not present

## 2022-12-30 DIAGNOSIS — M6281 Muscle weakness (generalized): Secondary | ICD-10-CM | POA: Diagnosis not present

## 2022-12-30 DIAGNOSIS — M792 Neuralgia and neuritis, unspecified: Secondary | ICD-10-CM | POA: Diagnosis not present

## 2022-12-30 DIAGNOSIS — M50322 Other cervical disc degeneration at C5-C6 level: Secondary | ICD-10-CM | POA: Diagnosis not present

## 2022-12-30 DIAGNOSIS — M48061 Spinal stenosis, lumbar region without neurogenic claudication: Secondary | ICD-10-CM | POA: Diagnosis not present

## 2022-12-30 DIAGNOSIS — Z66 Do not resuscitate: Secondary | ICD-10-CM | POA: Diagnosis not present

## 2022-12-30 DIAGNOSIS — R1313 Dysphagia, pharyngeal phase: Secondary | ICD-10-CM | POA: Diagnosis not present

## 2022-12-30 DIAGNOSIS — M1812 Unilateral primary osteoarthritis of first carpometacarpal joint, left hand: Secondary | ICD-10-CM | POA: Diagnosis not present

## 2022-12-30 DIAGNOSIS — R0602 Shortness of breath: Secondary | ICD-10-CM | POA: Diagnosis not present

## 2022-12-30 DIAGNOSIS — L88 Pyoderma gangrenosum: Secondary | ICD-10-CM | POA: Diagnosis not present

## 2022-12-30 DIAGNOSIS — R079 Chest pain, unspecified: Secondary | ICD-10-CM | POA: Diagnosis not present

## 2022-12-30 DIAGNOSIS — R5381 Other malaise: Secondary | ICD-10-CM | POA: Diagnosis not present

## 2022-12-30 DIAGNOSIS — C642 Malignant neoplasm of left kidney, except renal pelvis: Secondary | ICD-10-CM | POA: Diagnosis not present

## 2022-12-30 DIAGNOSIS — M47814 Spondylosis without myelopathy or radiculopathy, thoracic region: Secondary | ICD-10-CM | POA: Diagnosis not present

## 2022-12-30 DIAGNOSIS — R918 Other nonspecific abnormal finding of lung field: Secondary | ICD-10-CM | POA: Diagnosis not present

## 2022-12-30 DIAGNOSIS — M47816 Spondylosis without myelopathy or radiculopathy, lumbar region: Secondary | ICD-10-CM | POA: Diagnosis not present

## 2022-12-30 DIAGNOSIS — Z7901 Long term (current) use of anticoagulants: Secondary | ICD-10-CM | POA: Diagnosis not present

## 2022-12-30 DIAGNOSIS — M47817 Spondylosis without myelopathy or radiculopathy, lumbosacral region: Secondary | ICD-10-CM | POA: Diagnosis not present

## 2022-12-30 DIAGNOSIS — G4733 Obstructive sleep apnea (adult) (pediatric): Secondary | ICD-10-CM | POA: Diagnosis not present

## 2022-12-30 DIAGNOSIS — M5137 Other intervertebral disc degeneration, lumbosacral region: Secondary | ICD-10-CM | POA: Diagnosis not present

## 2023-01-02 DIAGNOSIS — C642 Malignant neoplasm of left kidney, except renal pelvis: Secondary | ICD-10-CM | POA: Diagnosis not present

## 2023-01-02 DIAGNOSIS — I4811 Longstanding persistent atrial fibrillation: Secondary | ICD-10-CM | POA: Diagnosis not present

## 2023-01-02 DIAGNOSIS — R918 Other nonspecific abnormal finding of lung field: Secondary | ICD-10-CM | POA: Diagnosis not present

## 2023-01-02 DIAGNOSIS — Z7189 Other specified counseling: Secondary | ICD-10-CM | POA: Diagnosis not present

## 2023-01-02 DIAGNOSIS — M19032 Primary osteoarthritis, left wrist: Secondary | ICD-10-CM | POA: Diagnosis not present

## 2023-01-02 DIAGNOSIS — R0602 Shortness of breath: Secondary | ICD-10-CM | POA: Diagnosis not present

## 2023-01-02 DIAGNOSIS — M792 Neuralgia and neuritis, unspecified: Secondary | ICD-10-CM | POA: Diagnosis not present

## 2023-01-02 DIAGNOSIS — G629 Polyneuropathy, unspecified: Secondary | ICD-10-CM | POA: Diagnosis not present

## 2023-01-02 DIAGNOSIS — U071 COVID-19: Secondary | ICD-10-CM | POA: Diagnosis not present

## 2023-01-02 DIAGNOSIS — J9601 Acute respiratory failure with hypoxia: Secondary | ICD-10-CM | POA: Diagnosis not present

## 2023-01-02 DIAGNOSIS — Z515 Encounter for palliative care: Secondary | ICD-10-CM | POA: Diagnosis not present

## 2023-01-02 DIAGNOSIS — M1812 Unilateral primary osteoarthritis of first carpometacarpal joint, left hand: Secondary | ICD-10-CM | POA: Diagnosis not present

## 2023-01-03 DIAGNOSIS — I4811 Longstanding persistent atrial fibrillation: Secondary | ICD-10-CM | POA: Diagnosis not present

## 2023-01-03 DIAGNOSIS — G629 Polyneuropathy, unspecified: Secondary | ICD-10-CM | POA: Diagnosis not present

## 2023-01-03 DIAGNOSIS — L88 Pyoderma gangrenosum: Secondary | ICD-10-CM | POA: Diagnosis not present

## 2023-01-03 DIAGNOSIS — U071 COVID-19: Secondary | ICD-10-CM | POA: Diagnosis not present

## 2023-01-03 DIAGNOSIS — J9601 Acute respiratory failure with hypoxia: Secondary | ICD-10-CM | POA: Diagnosis not present

## 2023-01-04 DIAGNOSIS — M4184 Other forms of scoliosis, thoracic region: Secondary | ICD-10-CM | POA: Diagnosis not present

## 2023-01-04 DIAGNOSIS — U071 COVID-19: Secondary | ICD-10-CM | POA: Diagnosis not present

## 2023-01-04 DIAGNOSIS — M47814 Spondylosis without myelopathy or radiculopathy, thoracic region: Secondary | ICD-10-CM | POA: Diagnosis not present

## 2023-01-04 DIAGNOSIS — M5031 Other cervical disc degeneration,  high cervical region: Secondary | ICD-10-CM | POA: Diagnosis not present

## 2023-01-04 DIAGNOSIS — J9601 Acute respiratory failure with hypoxia: Secondary | ICD-10-CM | POA: Diagnosis not present

## 2023-01-04 DIAGNOSIS — M5134 Other intervertebral disc degeneration, thoracic region: Secondary | ICD-10-CM | POA: Diagnosis not present

## 2023-01-04 DIAGNOSIS — Z515 Encounter for palliative care: Secondary | ICD-10-CM | POA: Diagnosis not present

## 2023-01-04 DIAGNOSIS — I4811 Longstanding persistent atrial fibrillation: Secondary | ICD-10-CM | POA: Diagnosis not present

## 2023-01-04 DIAGNOSIS — M5136 Other intervertebral disc degeneration, lumbar region: Secondary | ICD-10-CM | POA: Diagnosis not present

## 2023-01-04 DIAGNOSIS — M50322 Other cervical disc degeneration at C5-C6 level: Secondary | ICD-10-CM | POA: Diagnosis not present

## 2023-01-04 DIAGNOSIS — M47816 Spondylosis without myelopathy or radiculopathy, lumbar region: Secondary | ICD-10-CM | POA: Diagnosis not present

## 2023-01-04 DIAGNOSIS — M50321 Other cervical disc degeneration at C4-C5 level: Secondary | ICD-10-CM | POA: Diagnosis not present

## 2023-01-04 DIAGNOSIS — M47817 Spondylosis without myelopathy or radiculopathy, lumbosacral region: Secondary | ICD-10-CM | POA: Diagnosis not present

## 2023-01-04 DIAGNOSIS — M5137 Other intervertebral disc degeneration, lumbosacral region: Secondary | ICD-10-CM | POA: Diagnosis not present

## 2023-01-04 DIAGNOSIS — Z7189 Other specified counseling: Secondary | ICD-10-CM | POA: Diagnosis not present

## 2023-01-04 DIAGNOSIS — G629 Polyneuropathy, unspecified: Secondary | ICD-10-CM | POA: Diagnosis not present

## 2023-01-04 DIAGNOSIS — L88 Pyoderma gangrenosum: Secondary | ICD-10-CM | POA: Diagnosis not present

## 2023-01-04 DIAGNOSIS — M47812 Spondylosis without myelopathy or radiculopathy, cervical region: Secondary | ICD-10-CM | POA: Diagnosis not present

## 2023-01-04 DIAGNOSIS — M792 Neuralgia and neuritis, unspecified: Secondary | ICD-10-CM | POA: Diagnosis not present

## 2023-01-05 DIAGNOSIS — I517 Cardiomegaly: Secondary | ICD-10-CM | POA: Diagnosis not present

## 2023-01-05 DIAGNOSIS — I4891 Unspecified atrial fibrillation: Secondary | ICD-10-CM | POA: Diagnosis not present

## 2023-01-05 DIAGNOSIS — U071 COVID-19: Secondary | ICD-10-CM | POA: Diagnosis not present

## 2023-01-05 DIAGNOSIS — J9601 Acute respiratory failure with hypoxia: Secondary | ICD-10-CM | POA: Diagnosis not present

## 2023-01-05 DIAGNOSIS — I4811 Longstanding persistent atrial fibrillation: Secondary | ICD-10-CM | POA: Diagnosis not present

## 2023-01-05 DIAGNOSIS — L88 Pyoderma gangrenosum: Secondary | ICD-10-CM | POA: Diagnosis not present

## 2023-01-05 DIAGNOSIS — G629 Polyneuropathy, unspecified: Secondary | ICD-10-CM | POA: Diagnosis not present

## 2023-01-05 DIAGNOSIS — I080 Rheumatic disorders of both mitral and aortic valves: Secondary | ICD-10-CM | POA: Diagnosis not present

## 2023-01-06 DIAGNOSIS — L88 Pyoderma gangrenosum: Secondary | ICD-10-CM | POA: Diagnosis not present

## 2023-01-06 DIAGNOSIS — U071 COVID-19: Secondary | ICD-10-CM | POA: Diagnosis not present

## 2023-01-06 DIAGNOSIS — I4811 Longstanding persistent atrial fibrillation: Secondary | ICD-10-CM | POA: Diagnosis not present

## 2023-01-06 DIAGNOSIS — J9601 Acute respiratory failure with hypoxia: Secondary | ICD-10-CM | POA: Diagnosis not present

## 2023-01-06 DIAGNOSIS — G629 Polyneuropathy, unspecified: Secondary | ICD-10-CM | POA: Diagnosis not present

## 2023-01-07 DIAGNOSIS — G629 Polyneuropathy, unspecified: Secondary | ICD-10-CM | POA: Diagnosis not present

## 2023-01-07 DIAGNOSIS — R001 Bradycardia, unspecified: Secondary | ICD-10-CM | POA: Diagnosis not present

## 2023-01-07 DIAGNOSIS — J9601 Acute respiratory failure with hypoxia: Secondary | ICD-10-CM | POA: Diagnosis not present

## 2023-01-07 DIAGNOSIS — U071 COVID-19: Secondary | ICD-10-CM | POA: Diagnosis not present

## 2023-01-07 DIAGNOSIS — I495 Sick sinus syndrome: Secondary | ICD-10-CM | POA: Diagnosis not present

## 2023-01-07 DIAGNOSIS — L88 Pyoderma gangrenosum: Secondary | ICD-10-CM | POA: Diagnosis not present

## 2023-01-07 DIAGNOSIS — I4811 Longstanding persistent atrial fibrillation: Secondary | ICD-10-CM | POA: Diagnosis not present

## 2023-01-07 DIAGNOSIS — Z7901 Long term (current) use of anticoagulants: Secondary | ICD-10-CM | POA: Diagnosis not present

## 2023-01-08 DIAGNOSIS — J9601 Acute respiratory failure with hypoxia: Secondary | ICD-10-CM | POA: Diagnosis not present

## 2023-01-08 DIAGNOSIS — I4811 Longstanding persistent atrial fibrillation: Secondary | ICD-10-CM | POA: Diagnosis not present

## 2023-01-08 DIAGNOSIS — U071 COVID-19: Secondary | ICD-10-CM | POA: Diagnosis not present

## 2023-01-08 DIAGNOSIS — G629 Polyneuropathy, unspecified: Secondary | ICD-10-CM | POA: Diagnosis not present

## 2023-01-08 DIAGNOSIS — Z7901 Long term (current) use of anticoagulants: Secondary | ICD-10-CM | POA: Diagnosis not present

## 2023-01-08 DIAGNOSIS — I495 Sick sinus syndrome: Secondary | ICD-10-CM | POA: Diagnosis not present

## 2023-01-08 DIAGNOSIS — L88 Pyoderma gangrenosum: Secondary | ICD-10-CM | POA: Diagnosis not present

## 2023-01-08 DIAGNOSIS — R001 Bradycardia, unspecified: Secondary | ICD-10-CM | POA: Diagnosis not present

## 2023-01-09 DIAGNOSIS — I4811 Longstanding persistent atrial fibrillation: Secondary | ICD-10-CM | POA: Diagnosis not present

## 2023-01-09 DIAGNOSIS — L88 Pyoderma gangrenosum: Secondary | ICD-10-CM | POA: Diagnosis not present

## 2023-01-09 DIAGNOSIS — G629 Polyneuropathy, unspecified: Secondary | ICD-10-CM | POA: Diagnosis not present

## 2023-01-09 DIAGNOSIS — J9601 Acute respiratory failure with hypoxia: Secondary | ICD-10-CM | POA: Diagnosis not present

## 2023-01-09 DIAGNOSIS — U071 COVID-19: Secondary | ICD-10-CM | POA: Diagnosis not present

## 2023-01-10 DIAGNOSIS — R001 Bradycardia, unspecified: Secondary | ICD-10-CM | POA: Diagnosis not present

## 2023-01-10 DIAGNOSIS — U071 COVID-19: Secondary | ICD-10-CM | POA: Diagnosis not present

## 2023-01-10 DIAGNOSIS — Z515 Encounter for palliative care: Secondary | ICD-10-CM | POA: Diagnosis not present

## 2023-01-10 DIAGNOSIS — I480 Paroxysmal atrial fibrillation: Secondary | ICD-10-CM | POA: Diagnosis not present

## 2023-01-10 DIAGNOSIS — Z7189 Other specified counseling: Secondary | ICD-10-CM | POA: Diagnosis not present

## 2023-01-10 DIAGNOSIS — J9601 Acute respiratory failure with hypoxia: Secondary | ICD-10-CM | POA: Diagnosis not present

## 2023-01-10 DIAGNOSIS — I4891 Unspecified atrial fibrillation: Secondary | ICD-10-CM | POA: Diagnosis not present

## 2023-01-10 DIAGNOSIS — M792 Neuralgia and neuritis, unspecified: Secondary | ICD-10-CM | POA: Diagnosis not present

## 2023-01-11 DIAGNOSIS — J9601 Acute respiratory failure with hypoxia: Secondary | ICD-10-CM | POA: Diagnosis not present

## 2023-01-12 DIAGNOSIS — U071 COVID-19: Secondary | ICD-10-CM | POA: Diagnosis not present

## 2023-01-12 DIAGNOSIS — J9601 Acute respiratory failure with hypoxia: Secondary | ICD-10-CM | POA: Diagnosis not present

## 2023-01-12 DIAGNOSIS — Z7189 Other specified counseling: Secondary | ICD-10-CM | POA: Diagnosis not present

## 2023-01-12 DIAGNOSIS — Z515 Encounter for palliative care: Secondary | ICD-10-CM | POA: Diagnosis not present

## 2023-01-12 DIAGNOSIS — M792 Neuralgia and neuritis, unspecified: Secondary | ICD-10-CM | POA: Diagnosis not present

## 2023-01-13 DIAGNOSIS — M792 Neuralgia and neuritis, unspecified: Secondary | ICD-10-CM | POA: Diagnosis not present

## 2023-01-13 DIAGNOSIS — R001 Bradycardia, unspecified: Secondary | ICD-10-CM | POA: Diagnosis not present

## 2023-01-13 DIAGNOSIS — M79643 Pain in unspecified hand: Secondary | ICD-10-CM | POA: Diagnosis not present

## 2023-01-13 DIAGNOSIS — B370 Candidal stomatitis: Secondary | ICD-10-CM | POA: Diagnosis not present

## 2023-01-13 DIAGNOSIS — M6281 Muscle weakness (generalized): Secondary | ICD-10-CM | POA: Diagnosis not present

## 2023-01-13 DIAGNOSIS — E559 Vitamin D deficiency, unspecified: Secondary | ICD-10-CM | POA: Diagnosis not present

## 2023-01-13 DIAGNOSIS — R1313 Dysphagia, pharyngeal phase: Secondary | ICD-10-CM | POA: Diagnosis not present

## 2023-01-13 DIAGNOSIS — I4811 Longstanding persistent atrial fibrillation: Secondary | ICD-10-CM | POA: Diagnosis not present

## 2023-01-13 DIAGNOSIS — G629 Polyneuropathy, unspecified: Secondary | ICD-10-CM | POA: Diagnosis not present

## 2023-01-13 DIAGNOSIS — G4700 Insomnia, unspecified: Secondary | ICD-10-CM | POA: Diagnosis not present

## 2023-01-13 DIAGNOSIS — I4891 Unspecified atrial fibrillation: Secondary | ICD-10-CM | POA: Diagnosis not present

## 2023-01-13 DIAGNOSIS — B379 Candidiasis, unspecified: Secondary | ICD-10-CM | POA: Diagnosis not present

## 2023-01-13 DIAGNOSIS — Z515 Encounter for palliative care: Secondary | ICD-10-CM | POA: Diagnosis not present

## 2023-01-13 DIAGNOSIS — I10 Essential (primary) hypertension: Secondary | ICD-10-CM | POA: Diagnosis not present

## 2023-01-13 DIAGNOSIS — Z950 Presence of cardiac pacemaker: Secondary | ICD-10-CM | POA: Diagnosis not present

## 2023-01-13 DIAGNOSIS — R5381 Other malaise: Secondary | ICD-10-CM | POA: Diagnosis not present

## 2023-01-13 DIAGNOSIS — U071 COVID-19: Secondary | ICD-10-CM | POA: Diagnosis not present

## 2023-01-13 DIAGNOSIS — R2689 Other abnormalities of gait and mobility: Secondary | ICD-10-CM | POA: Diagnosis not present

## 2023-01-13 DIAGNOSIS — J9601 Acute respiratory failure with hypoxia: Secondary | ICD-10-CM | POA: Diagnosis not present

## 2023-01-13 DIAGNOSIS — R1312 Dysphagia, oropharyngeal phase: Secondary | ICD-10-CM | POA: Diagnosis not present

## 2023-01-13 DIAGNOSIS — G4733 Obstructive sleep apnea (adult) (pediatric): Secondary | ICD-10-CM | POA: Diagnosis not present

## 2023-01-13 DIAGNOSIS — G9009 Other idiopathic peripheral autonomic neuropathy: Secondary | ICD-10-CM | POA: Diagnosis not present

## 2023-01-13 DIAGNOSIS — E119 Type 2 diabetes mellitus without complications: Secondary | ICD-10-CM | POA: Diagnosis not present

## 2023-01-13 DIAGNOSIS — C7902 Secondary malignant neoplasm of left kidney and renal pelvis: Secondary | ICD-10-CM | POA: Diagnosis not present

## 2023-01-13 DIAGNOSIS — R2681 Unsteadiness on feet: Secondary | ICD-10-CM | POA: Diagnosis not present

## 2023-01-16 DIAGNOSIS — I4891 Unspecified atrial fibrillation: Secondary | ICD-10-CM | POA: Diagnosis not present

## 2023-01-16 DIAGNOSIS — U071 COVID-19: Secondary | ICD-10-CM | POA: Diagnosis not present

## 2023-01-16 DIAGNOSIS — R2689 Other abnormalities of gait and mobility: Secondary | ICD-10-CM | POA: Diagnosis not present

## 2023-01-16 DIAGNOSIS — J9601 Acute respiratory failure with hypoxia: Secondary | ICD-10-CM | POA: Diagnosis not present

## 2023-01-16 DIAGNOSIS — G629 Polyneuropathy, unspecified: Secondary | ICD-10-CM | POA: Diagnosis not present

## 2023-01-16 DIAGNOSIS — M6281 Muscle weakness (generalized): Secondary | ICD-10-CM | POA: Diagnosis not present

## 2023-01-16 DIAGNOSIS — Z950 Presence of cardiac pacemaker: Secondary | ICD-10-CM | POA: Diagnosis not present

## 2023-01-17 DIAGNOSIS — J9601 Acute respiratory failure with hypoxia: Secondary | ICD-10-CM | POA: Diagnosis not present

## 2023-01-17 DIAGNOSIS — U071 COVID-19: Secondary | ICD-10-CM | POA: Diagnosis not present

## 2023-01-17 DIAGNOSIS — I4891 Unspecified atrial fibrillation: Secondary | ICD-10-CM | POA: Diagnosis not present

## 2023-01-17 DIAGNOSIS — Z950 Presence of cardiac pacemaker: Secondary | ICD-10-CM | POA: Diagnosis not present

## 2023-01-18 ENCOUNTER — Other Ambulatory Visit: Payer: Self-pay | Admitting: *Deleted

## 2023-01-18 DIAGNOSIS — M79643 Pain in unspecified hand: Secondary | ICD-10-CM | POA: Diagnosis not present

## 2023-01-18 DIAGNOSIS — Z950 Presence of cardiac pacemaker: Secondary | ICD-10-CM | POA: Diagnosis not present

## 2023-01-18 DIAGNOSIS — I4891 Unspecified atrial fibrillation: Secondary | ICD-10-CM | POA: Diagnosis not present

## 2023-01-18 NOTE — Patient Outreach (Signed)
Mr. Homes resides in [place] skilled [facility]. Screening for care coordination services as benefit of health plan and Primary Care Provider.   Primary Care Provider's office [facility] has Upstream care management.   Will continue to follow.   Marthenia Rolling, MSN, RN,BSN [place] Acute Care Coordinator [phone] (Direct dial)

## 2023-01-19 DIAGNOSIS — U071 COVID-19: Secondary | ICD-10-CM | POA: Diagnosis not present

## 2023-01-19 DIAGNOSIS — G629 Polyneuropathy, unspecified: Secondary | ICD-10-CM | POA: Diagnosis not present

## 2023-01-19 DIAGNOSIS — R2689 Other abnormalities of gait and mobility: Secondary | ICD-10-CM | POA: Diagnosis not present

## 2023-01-19 DIAGNOSIS — I4891 Unspecified atrial fibrillation: Secondary | ICD-10-CM | POA: Diagnosis not present

## 2023-01-19 DIAGNOSIS — M6281 Muscle weakness (generalized): Secondary | ICD-10-CM | POA: Diagnosis not present

## 2023-01-19 DIAGNOSIS — J9601 Acute respiratory failure with hypoxia: Secondary | ICD-10-CM | POA: Diagnosis not present

## 2023-01-19 DIAGNOSIS — Z950 Presence of cardiac pacemaker: Secondary | ICD-10-CM | POA: Diagnosis not present

## 2023-01-23 DIAGNOSIS — R2689 Other abnormalities of gait and mobility: Secondary | ICD-10-CM | POA: Diagnosis not present

## 2023-01-23 DIAGNOSIS — B370 Candidal stomatitis: Secondary | ICD-10-CM | POA: Diagnosis not present

## 2023-01-23 DIAGNOSIS — Z950 Presence of cardiac pacemaker: Secondary | ICD-10-CM | POA: Diagnosis not present

## 2023-01-23 DIAGNOSIS — U071 COVID-19: Secondary | ICD-10-CM | POA: Diagnosis not present

## 2023-01-23 DIAGNOSIS — M6281 Muscle weakness (generalized): Secondary | ICD-10-CM | POA: Diagnosis not present

## 2023-01-23 DIAGNOSIS — I4891 Unspecified atrial fibrillation: Secondary | ICD-10-CM | POA: Diagnosis not present

## 2023-01-23 DIAGNOSIS — J9601 Acute respiratory failure with hypoxia: Secondary | ICD-10-CM | POA: Diagnosis not present

## 2023-01-23 DIAGNOSIS — E559 Vitamin D deficiency, unspecified: Secondary | ICD-10-CM | POA: Diagnosis not present

## 2023-01-23 DIAGNOSIS — G629 Polyneuropathy, unspecified: Secondary | ICD-10-CM | POA: Diagnosis not present

## 2023-01-26 DIAGNOSIS — I4891 Unspecified atrial fibrillation: Secondary | ICD-10-CM | POA: Diagnosis not present

## 2023-01-26 DIAGNOSIS — R2689 Other abnormalities of gait and mobility: Secondary | ICD-10-CM | POA: Diagnosis not present

## 2023-01-26 DIAGNOSIS — G629 Polyneuropathy, unspecified: Secondary | ICD-10-CM | POA: Diagnosis not present

## 2023-01-26 DIAGNOSIS — M6281 Muscle weakness (generalized): Secondary | ICD-10-CM | POA: Diagnosis not present

## 2023-01-26 DIAGNOSIS — U071 COVID-19: Secondary | ICD-10-CM | POA: Diagnosis not present

## 2023-01-26 DIAGNOSIS — Z950 Presence of cardiac pacemaker: Secondary | ICD-10-CM | POA: Diagnosis not present

## 2023-01-26 DIAGNOSIS — J9601 Acute respiratory failure with hypoxia: Secondary | ICD-10-CM | POA: Diagnosis not present

## 2023-01-30 DIAGNOSIS — U071 COVID-19: Secondary | ICD-10-CM | POA: Diagnosis not present

## 2023-01-30 DIAGNOSIS — R2689 Other abnormalities of gait and mobility: Secondary | ICD-10-CM | POA: Diagnosis not present

## 2023-01-30 DIAGNOSIS — J9601 Acute respiratory failure with hypoxia: Secondary | ICD-10-CM | POA: Diagnosis not present

## 2023-01-30 DIAGNOSIS — I4891 Unspecified atrial fibrillation: Secondary | ICD-10-CM | POA: Diagnosis not present

## 2023-01-30 DIAGNOSIS — G629 Polyneuropathy, unspecified: Secondary | ICD-10-CM | POA: Diagnosis not present

## 2023-01-30 DIAGNOSIS — M6281 Muscle weakness (generalized): Secondary | ICD-10-CM | POA: Diagnosis not present

## 2023-01-30 DIAGNOSIS — Z950 Presence of cardiac pacemaker: Secondary | ICD-10-CM | POA: Diagnosis not present

## 2023-01-31 ENCOUNTER — Other Ambulatory Visit: Payer: Self-pay | Admitting: *Deleted

## 2023-01-31 NOTE — Patient Outreach (Addendum)
[facility] Post- Acute Care Coordinator follow up. Per [facility] Mr. Havron resides in [place] skilled [facility] facility. Screening for potential care coordination services as benefit of health plan and Primary Care Provider.  Secure communication sent to [place], [place] social worker to inquire about transition plans.  PCP office [facility] has Upstream care management.   Will continue to follow.   Marthenia Rolling, MSN, RN,BSN [place] Acute Care Coordinator [phone] (Direct dial)

## 2023-02-02 DIAGNOSIS — U071 COVID-19: Secondary | ICD-10-CM | POA: Diagnosis not present

## 2023-02-02 DIAGNOSIS — I4891 Unspecified atrial fibrillation: Secondary | ICD-10-CM | POA: Diagnosis not present

## 2023-02-02 DIAGNOSIS — M6281 Muscle weakness (generalized): Secondary | ICD-10-CM | POA: Diagnosis not present

## 2023-02-02 DIAGNOSIS — R2689 Other abnormalities of gait and mobility: Secondary | ICD-10-CM | POA: Diagnosis not present

## 2023-02-02 DIAGNOSIS — J9601 Acute respiratory failure with hypoxia: Secondary | ICD-10-CM | POA: Diagnosis not present

## 2023-02-02 DIAGNOSIS — Z950 Presence of cardiac pacemaker: Secondary | ICD-10-CM | POA: Diagnosis not present

## 2023-02-02 DIAGNOSIS — G629 Polyneuropathy, unspecified: Secondary | ICD-10-CM | POA: Diagnosis not present

## 2023-02-06 DIAGNOSIS — J9601 Acute respiratory failure with hypoxia: Secondary | ICD-10-CM | POA: Diagnosis not present

## 2023-02-06 DIAGNOSIS — G4700 Insomnia, unspecified: Secondary | ICD-10-CM | POA: Diagnosis not present

## 2023-02-06 DIAGNOSIS — I4891 Unspecified atrial fibrillation: Secondary | ICD-10-CM | POA: Diagnosis not present

## 2023-02-06 DIAGNOSIS — U071 COVID-19: Secondary | ICD-10-CM | POA: Diagnosis not present

## 2023-02-06 DIAGNOSIS — G629 Polyneuropathy, unspecified: Secondary | ICD-10-CM | POA: Diagnosis not present

## 2023-02-06 DIAGNOSIS — Z950 Presence of cardiac pacemaker: Secondary | ICD-10-CM | POA: Diagnosis not present

## 2023-02-06 DIAGNOSIS — R2689 Other abnormalities of gait and mobility: Secondary | ICD-10-CM | POA: Diagnosis not present

## 2023-02-06 DIAGNOSIS — E559 Vitamin D deficiency, unspecified: Secondary | ICD-10-CM | POA: Diagnosis not present

## 2023-02-06 DIAGNOSIS — M6281 Muscle weakness (generalized): Secondary | ICD-10-CM | POA: Diagnosis not present

## 2023-02-09 DIAGNOSIS — R2689 Other abnormalities of gait and mobility: Secondary | ICD-10-CM | POA: Diagnosis not present

## 2023-02-09 DIAGNOSIS — J9601 Acute respiratory failure with hypoxia: Secondary | ICD-10-CM | POA: Diagnosis not present

## 2023-02-09 DIAGNOSIS — U071 COVID-19: Secondary | ICD-10-CM | POA: Diagnosis not present

## 2023-02-09 DIAGNOSIS — I4891 Unspecified atrial fibrillation: Secondary | ICD-10-CM | POA: Diagnosis not present

## 2023-02-09 DIAGNOSIS — G629 Polyneuropathy, unspecified: Secondary | ICD-10-CM | POA: Diagnosis not present

## 2023-02-09 DIAGNOSIS — Z950 Presence of cardiac pacemaker: Secondary | ICD-10-CM | POA: Diagnosis not present

## 2023-02-09 DIAGNOSIS — M6281 Muscle weakness (generalized): Secondary | ICD-10-CM | POA: Diagnosis not present

## 2023-02-13 DIAGNOSIS — R2689 Other abnormalities of gait and mobility: Secondary | ICD-10-CM | POA: Diagnosis not present

## 2023-02-13 DIAGNOSIS — J9601 Acute respiratory failure with hypoxia: Secondary | ICD-10-CM | POA: Diagnosis not present

## 2023-02-13 DIAGNOSIS — G629 Polyneuropathy, unspecified: Secondary | ICD-10-CM | POA: Diagnosis not present

## 2023-02-13 DIAGNOSIS — I4891 Unspecified atrial fibrillation: Secondary | ICD-10-CM | POA: Diagnosis not present

## 2023-02-13 DIAGNOSIS — M6281 Muscle weakness (generalized): Secondary | ICD-10-CM | POA: Diagnosis not present

## 2023-02-13 DIAGNOSIS — Z950 Presence of cardiac pacemaker: Secondary | ICD-10-CM | POA: Diagnosis not present

## 2023-02-13 DIAGNOSIS — U071 COVID-19: Secondary | ICD-10-CM | POA: Diagnosis not present

## 2023-02-16 DIAGNOSIS — G4700 Insomnia, unspecified: Secondary | ICD-10-CM | POA: Diagnosis not present

## 2023-02-16 DIAGNOSIS — I4891 Unspecified atrial fibrillation: Secondary | ICD-10-CM | POA: Diagnosis not present

## 2023-02-16 DIAGNOSIS — G9009 Other idiopathic peripheral autonomic neuropathy: Secondary | ICD-10-CM | POA: Diagnosis not present

## 2023-02-16 DIAGNOSIS — Z950 Presence of cardiac pacemaker: Secondary | ICD-10-CM | POA: Diagnosis not present

## 2023-02-21 DIAGNOSIS — I10 Essential (primary) hypertension: Secondary | ICD-10-CM | POA: Diagnosis not present

## 2023-02-21 DIAGNOSIS — E559 Vitamin D deficiency, unspecified: Secondary | ICD-10-CM | POA: Diagnosis not present

## 2023-02-21 DIAGNOSIS — E119 Type 2 diabetes mellitus without complications: Secondary | ICD-10-CM | POA: Diagnosis not present

## 2023-02-23 DIAGNOSIS — Z950 Presence of cardiac pacemaker: Secondary | ICD-10-CM | POA: Diagnosis not present

## 2023-02-23 DIAGNOSIS — G629 Polyneuropathy, unspecified: Secondary | ICD-10-CM | POA: Diagnosis not present

## 2023-02-23 DIAGNOSIS — M6281 Muscle weakness (generalized): Secondary | ICD-10-CM | POA: Diagnosis not present

## 2023-02-23 DIAGNOSIS — U071 COVID-19: Secondary | ICD-10-CM | POA: Diagnosis not present

## 2023-02-23 DIAGNOSIS — I4891 Unspecified atrial fibrillation: Secondary | ICD-10-CM | POA: Diagnosis not present

## 2023-02-23 DIAGNOSIS — R2689 Other abnormalities of gait and mobility: Secondary | ICD-10-CM | POA: Diagnosis not present

## 2023-02-23 DIAGNOSIS — J9601 Acute respiratory failure with hypoxia: Secondary | ICD-10-CM | POA: Diagnosis not present

## 2023-02-27 DIAGNOSIS — M6281 Muscle weakness (generalized): Secondary | ICD-10-CM | POA: Diagnosis not present

## 2023-02-27 DIAGNOSIS — U071 COVID-19: Secondary | ICD-10-CM | POA: Diagnosis not present

## 2023-02-27 DIAGNOSIS — R2689 Other abnormalities of gait and mobility: Secondary | ICD-10-CM | POA: Diagnosis not present

## 2023-02-27 DIAGNOSIS — G629 Polyneuropathy, unspecified: Secondary | ICD-10-CM | POA: Diagnosis not present

## 2023-02-27 DIAGNOSIS — I4891 Unspecified atrial fibrillation: Secondary | ICD-10-CM | POA: Diagnosis not present

## 2023-02-27 DIAGNOSIS — Z950 Presence of cardiac pacemaker: Secondary | ICD-10-CM | POA: Diagnosis not present

## 2023-02-27 DIAGNOSIS — J9601 Acute respiratory failure with hypoxia: Secondary | ICD-10-CM | POA: Diagnosis not present

## 2023-03-02 DIAGNOSIS — R2689 Other abnormalities of gait and mobility: Secondary | ICD-10-CM | POA: Diagnosis not present

## 2023-03-02 DIAGNOSIS — G4700 Insomnia, unspecified: Secondary | ICD-10-CM | POA: Diagnosis not present

## 2023-03-02 DIAGNOSIS — U071 COVID-19: Secondary | ICD-10-CM | POA: Diagnosis not present

## 2023-03-02 DIAGNOSIS — J9601 Acute respiratory failure with hypoxia: Secondary | ICD-10-CM | POA: Diagnosis not present

## 2023-03-02 DIAGNOSIS — Z950 Presence of cardiac pacemaker: Secondary | ICD-10-CM | POA: Diagnosis not present

## 2023-03-02 DIAGNOSIS — G629 Polyneuropathy, unspecified: Secondary | ICD-10-CM | POA: Diagnosis not present

## 2023-03-02 DIAGNOSIS — B379 Candidiasis, unspecified: Secondary | ICD-10-CM | POA: Diagnosis not present

## 2023-03-02 DIAGNOSIS — I4891 Unspecified atrial fibrillation: Secondary | ICD-10-CM | POA: Diagnosis not present

## 2023-03-02 DIAGNOSIS — M6281 Muscle weakness (generalized): Secondary | ICD-10-CM | POA: Diagnosis not present

## 2023-03-05 DIAGNOSIS — R1312 Dysphagia, oropharyngeal phase: Secondary | ICD-10-CM | POA: Diagnosis not present

## 2023-03-05 DIAGNOSIS — R1313 Dysphagia, pharyngeal phase: Secondary | ICD-10-CM | POA: Diagnosis not present

## 2023-03-05 DIAGNOSIS — R2689 Other abnormalities of gait and mobility: Secondary | ICD-10-CM | POA: Diagnosis not present

## 2023-03-05 DIAGNOSIS — J9601 Acute respiratory failure with hypoxia: Secondary | ICD-10-CM | POA: Diagnosis not present

## 2023-03-05 DIAGNOSIS — M6281 Muscle weakness (generalized): Secondary | ICD-10-CM | POA: Diagnosis not present

## 2023-03-06 DIAGNOSIS — M6281 Muscle weakness (generalized): Secondary | ICD-10-CM | POA: Diagnosis not present

## 2023-03-06 DIAGNOSIS — R1313 Dysphagia, pharyngeal phase: Secondary | ICD-10-CM | POA: Diagnosis not present

## 2023-03-06 DIAGNOSIS — R1312 Dysphagia, oropharyngeal phase: Secondary | ICD-10-CM | POA: Diagnosis not present

## 2023-03-06 DIAGNOSIS — J9601 Acute respiratory failure with hypoxia: Secondary | ICD-10-CM | POA: Diagnosis not present

## 2023-03-06 DIAGNOSIS — R2689 Other abnormalities of gait and mobility: Secondary | ICD-10-CM | POA: Diagnosis not present

## 2023-03-07 DIAGNOSIS — R2689 Other abnormalities of gait and mobility: Secondary | ICD-10-CM | POA: Diagnosis not present

## 2023-03-07 DIAGNOSIS — R1313 Dysphagia, pharyngeal phase: Secondary | ICD-10-CM | POA: Diagnosis not present

## 2023-03-07 DIAGNOSIS — J9601 Acute respiratory failure with hypoxia: Secondary | ICD-10-CM | POA: Diagnosis not present

## 2023-03-07 DIAGNOSIS — M6281 Muscle weakness (generalized): Secondary | ICD-10-CM | POA: Diagnosis not present

## 2023-03-07 DIAGNOSIS — R1312 Dysphagia, oropharyngeal phase: Secondary | ICD-10-CM | POA: Diagnosis not present

## 2023-03-08 DIAGNOSIS — R2689 Other abnormalities of gait and mobility: Secondary | ICD-10-CM | POA: Diagnosis not present

## 2023-03-08 DIAGNOSIS — M6281 Muscle weakness (generalized): Secondary | ICD-10-CM | POA: Diagnosis not present

## 2023-03-08 DIAGNOSIS — R1312 Dysphagia, oropharyngeal phase: Secondary | ICD-10-CM | POA: Diagnosis not present

## 2023-03-08 DIAGNOSIS — J9601 Acute respiratory failure with hypoxia: Secondary | ICD-10-CM | POA: Diagnosis not present

## 2023-03-08 DIAGNOSIS — R1313 Dysphagia, pharyngeal phase: Secondary | ICD-10-CM | POA: Diagnosis not present

## 2023-03-09 DIAGNOSIS — R1312 Dysphagia, oropharyngeal phase: Secondary | ICD-10-CM | POA: Diagnosis not present

## 2023-03-09 DIAGNOSIS — J9601 Acute respiratory failure with hypoxia: Secondary | ICD-10-CM | POA: Diagnosis not present

## 2023-03-09 DIAGNOSIS — M6281 Muscle weakness (generalized): Secondary | ICD-10-CM | POA: Diagnosis not present

## 2023-03-09 DIAGNOSIS — R1313 Dysphagia, pharyngeal phase: Secondary | ICD-10-CM | POA: Diagnosis not present

## 2023-03-09 DIAGNOSIS — R2689 Other abnormalities of gait and mobility: Secondary | ICD-10-CM | POA: Diagnosis not present

## 2023-03-10 DIAGNOSIS — R1313 Dysphagia, pharyngeal phase: Secondary | ICD-10-CM | POA: Diagnosis not present

## 2023-03-10 DIAGNOSIS — R1312 Dysphagia, oropharyngeal phase: Secondary | ICD-10-CM | POA: Diagnosis not present

## 2023-03-10 DIAGNOSIS — M6281 Muscle weakness (generalized): Secondary | ICD-10-CM | POA: Diagnosis not present

## 2023-03-10 DIAGNOSIS — R2689 Other abnormalities of gait and mobility: Secondary | ICD-10-CM | POA: Diagnosis not present

## 2023-03-10 DIAGNOSIS — J9601 Acute respiratory failure with hypoxia: Secondary | ICD-10-CM | POA: Diagnosis not present

## 2023-03-11 DIAGNOSIS — J9601 Acute respiratory failure with hypoxia: Secondary | ICD-10-CM | POA: Diagnosis not present

## 2023-03-11 DIAGNOSIS — R1312 Dysphagia, oropharyngeal phase: Secondary | ICD-10-CM | POA: Diagnosis not present

## 2023-03-11 DIAGNOSIS — R2689 Other abnormalities of gait and mobility: Secondary | ICD-10-CM | POA: Diagnosis not present

## 2023-03-11 DIAGNOSIS — R1313 Dysphagia, pharyngeal phase: Secondary | ICD-10-CM | POA: Diagnosis not present

## 2023-03-11 DIAGNOSIS — M6281 Muscle weakness (generalized): Secondary | ICD-10-CM | POA: Diagnosis not present

## 2023-03-12 DIAGNOSIS — J9601 Acute respiratory failure with hypoxia: Secondary | ICD-10-CM | POA: Diagnosis not present

## 2023-03-12 DIAGNOSIS — R2689 Other abnormalities of gait and mobility: Secondary | ICD-10-CM | POA: Diagnosis not present

## 2023-03-12 DIAGNOSIS — M6281 Muscle weakness (generalized): Secondary | ICD-10-CM | POA: Diagnosis not present

## 2023-03-12 DIAGNOSIS — R1312 Dysphagia, oropharyngeal phase: Secondary | ICD-10-CM | POA: Diagnosis not present

## 2023-03-12 DIAGNOSIS — R1313 Dysphagia, pharyngeal phase: Secondary | ICD-10-CM | POA: Diagnosis not present

## 2023-03-13 DIAGNOSIS — J9601 Acute respiratory failure with hypoxia: Secondary | ICD-10-CM | POA: Diagnosis not present

## 2023-03-13 DIAGNOSIS — R1313 Dysphagia, pharyngeal phase: Secondary | ICD-10-CM | POA: Diagnosis not present

## 2023-03-13 DIAGNOSIS — R1312 Dysphagia, oropharyngeal phase: Secondary | ICD-10-CM | POA: Diagnosis not present

## 2023-03-13 DIAGNOSIS — M6281 Muscle weakness (generalized): Secondary | ICD-10-CM | POA: Diagnosis not present

## 2023-03-13 DIAGNOSIS — R2689 Other abnormalities of gait and mobility: Secondary | ICD-10-CM | POA: Diagnosis not present

## 2023-03-14 DIAGNOSIS — M6281 Muscle weakness (generalized): Secondary | ICD-10-CM | POA: Diagnosis not present

## 2023-03-14 DIAGNOSIS — R1312 Dysphagia, oropharyngeal phase: Secondary | ICD-10-CM | POA: Diagnosis not present

## 2023-03-14 DIAGNOSIS — J9601 Acute respiratory failure with hypoxia: Secondary | ICD-10-CM | POA: Diagnosis not present

## 2023-03-14 DIAGNOSIS — R2689 Other abnormalities of gait and mobility: Secondary | ICD-10-CM | POA: Diagnosis not present

## 2023-03-14 DIAGNOSIS — R1313 Dysphagia, pharyngeal phase: Secondary | ICD-10-CM | POA: Diagnosis not present

## 2023-03-15 DIAGNOSIS — R1313 Dysphagia, pharyngeal phase: Secondary | ICD-10-CM | POA: Diagnosis not present

## 2023-03-15 DIAGNOSIS — J9610 Chronic respiratory failure, unspecified whether with hypoxia or hypercapnia: Secondary | ICD-10-CM | POA: Diagnosis not present

## 2023-03-15 DIAGNOSIS — E559 Vitamin D deficiency, unspecified: Secondary | ICD-10-CM | POA: Diagnosis not present

## 2023-03-15 DIAGNOSIS — I4891 Unspecified atrial fibrillation: Secondary | ICD-10-CM | POA: Diagnosis not present

## 2023-03-15 DIAGNOSIS — G4700 Insomnia, unspecified: Secondary | ICD-10-CM | POA: Diagnosis not present

## 2023-03-15 DIAGNOSIS — M6281 Muscle weakness (generalized): Secondary | ICD-10-CM | POA: Diagnosis not present

## 2023-03-15 DIAGNOSIS — J9601 Acute respiratory failure with hypoxia: Secondary | ICD-10-CM | POA: Diagnosis not present

## 2023-03-15 DIAGNOSIS — R1312 Dysphagia, oropharyngeal phase: Secondary | ICD-10-CM | POA: Diagnosis not present

## 2023-03-15 DIAGNOSIS — R2689 Other abnormalities of gait and mobility: Secondary | ICD-10-CM | POA: Diagnosis not present

## 2023-03-16 DIAGNOSIS — J9601 Acute respiratory failure with hypoxia: Secondary | ICD-10-CM | POA: Diagnosis not present

## 2023-03-16 DIAGNOSIS — M6281 Muscle weakness (generalized): Secondary | ICD-10-CM | POA: Diagnosis not present

## 2023-03-16 DIAGNOSIS — R2689 Other abnormalities of gait and mobility: Secondary | ICD-10-CM | POA: Diagnosis not present

## 2023-03-16 DIAGNOSIS — R1312 Dysphagia, oropharyngeal phase: Secondary | ICD-10-CM | POA: Diagnosis not present

## 2023-03-16 DIAGNOSIS — R1313 Dysphagia, pharyngeal phase: Secondary | ICD-10-CM | POA: Diagnosis not present

## 2023-03-17 DIAGNOSIS — R2689 Other abnormalities of gait and mobility: Secondary | ICD-10-CM | POA: Diagnosis not present

## 2023-03-17 DIAGNOSIS — R1313 Dysphagia, pharyngeal phase: Secondary | ICD-10-CM | POA: Diagnosis not present

## 2023-03-17 DIAGNOSIS — M6281 Muscle weakness (generalized): Secondary | ICD-10-CM | POA: Diagnosis not present

## 2023-03-17 DIAGNOSIS — J9601 Acute respiratory failure with hypoxia: Secondary | ICD-10-CM | POA: Diagnosis not present

## 2023-03-17 DIAGNOSIS — R1312 Dysphagia, oropharyngeal phase: Secondary | ICD-10-CM | POA: Diagnosis not present

## 2023-03-18 DIAGNOSIS — R1313 Dysphagia, pharyngeal phase: Secondary | ICD-10-CM | POA: Diagnosis not present

## 2023-03-18 DIAGNOSIS — J9601 Acute respiratory failure with hypoxia: Secondary | ICD-10-CM | POA: Diagnosis not present

## 2023-03-18 DIAGNOSIS — M6281 Muscle weakness (generalized): Secondary | ICD-10-CM | POA: Diagnosis not present

## 2023-03-18 DIAGNOSIS — R2689 Other abnormalities of gait and mobility: Secondary | ICD-10-CM | POA: Diagnosis not present

## 2023-03-18 DIAGNOSIS — R1312 Dysphagia, oropharyngeal phase: Secondary | ICD-10-CM | POA: Diagnosis not present

## 2023-03-20 DIAGNOSIS — R2689 Other abnormalities of gait and mobility: Secondary | ICD-10-CM | POA: Diagnosis not present

## 2023-03-20 DIAGNOSIS — M6281 Muscle weakness (generalized): Secondary | ICD-10-CM | POA: Diagnosis not present

## 2023-03-20 DIAGNOSIS — J9601 Acute respiratory failure with hypoxia: Secondary | ICD-10-CM | POA: Diagnosis not present

## 2023-03-21 DIAGNOSIS — R2689 Other abnormalities of gait and mobility: Secondary | ICD-10-CM | POA: Diagnosis not present

## 2023-03-21 DIAGNOSIS — M6281 Muscle weakness (generalized): Secondary | ICD-10-CM | POA: Diagnosis not present

## 2023-03-21 DIAGNOSIS — J9601 Acute respiratory failure with hypoxia: Secondary | ICD-10-CM | POA: Diagnosis not present

## 2023-04-18 DIAGNOSIS — E559 Vitamin D deficiency, unspecified: Secondary | ICD-10-CM | POA: Diagnosis not present

## 2023-04-18 DIAGNOSIS — G9009 Other idiopathic peripheral autonomic neuropathy: Secondary | ICD-10-CM | POA: Diagnosis not present

## 2023-04-18 DIAGNOSIS — I4891 Unspecified atrial fibrillation: Secondary | ICD-10-CM | POA: Diagnosis not present

## 2023-04-20 DIAGNOSIS — Z950 Presence of cardiac pacemaker: Secondary | ICD-10-CM | POA: Diagnosis not present

## 2023-04-20 DIAGNOSIS — I4891 Unspecified atrial fibrillation: Secondary | ICD-10-CM | POA: Diagnosis not present

## 2023-04-20 DIAGNOSIS — E559 Vitamin D deficiency, unspecified: Secondary | ICD-10-CM | POA: Diagnosis not present

## 2023-04-20 DIAGNOSIS — G9009 Other idiopathic peripheral autonomic neuropathy: Secondary | ICD-10-CM | POA: Diagnosis not present

## 2023-04-21 DIAGNOSIS — J9601 Acute respiratory failure with hypoxia: Secondary | ICD-10-CM | POA: Diagnosis not present

## 2023-04-21 DIAGNOSIS — R2689 Other abnormalities of gait and mobility: Secondary | ICD-10-CM | POA: Diagnosis not present

## 2023-04-21 DIAGNOSIS — M6281 Muscle weakness (generalized): Secondary | ICD-10-CM | POA: Diagnosis not present

## 2023-04-22 DIAGNOSIS — R2689 Other abnormalities of gait and mobility: Secondary | ICD-10-CM | POA: Diagnosis not present

## 2023-04-22 DIAGNOSIS — J9601 Acute respiratory failure with hypoxia: Secondary | ICD-10-CM | POA: Diagnosis not present

## 2023-04-22 DIAGNOSIS — M6281 Muscle weakness (generalized): Secondary | ICD-10-CM | POA: Diagnosis not present

## 2023-04-23 DIAGNOSIS — J9601 Acute respiratory failure with hypoxia: Secondary | ICD-10-CM | POA: Diagnosis not present

## 2023-04-23 DIAGNOSIS — M6281 Muscle weakness (generalized): Secondary | ICD-10-CM | POA: Diagnosis not present

## 2023-04-23 DIAGNOSIS — R2689 Other abnormalities of gait and mobility: Secondary | ICD-10-CM | POA: Diagnosis not present

## 2023-04-24 DIAGNOSIS — M6281 Muscle weakness (generalized): Secondary | ICD-10-CM | POA: Diagnosis not present

## 2023-04-24 DIAGNOSIS — R2689 Other abnormalities of gait and mobility: Secondary | ICD-10-CM | POA: Diagnosis not present

## 2023-04-24 DIAGNOSIS — J9601 Acute respiratory failure with hypoxia: Secondary | ICD-10-CM | POA: Diagnosis not present

## 2023-04-25 DIAGNOSIS — M6281 Muscle weakness (generalized): Secondary | ICD-10-CM | POA: Diagnosis not present

## 2023-04-25 DIAGNOSIS — I4891 Unspecified atrial fibrillation: Secondary | ICD-10-CM | POA: Diagnosis not present

## 2023-04-25 DIAGNOSIS — G9009 Other idiopathic peripheral autonomic neuropathy: Secondary | ICD-10-CM | POA: Diagnosis not present

## 2023-04-25 DIAGNOSIS — R2689 Other abnormalities of gait and mobility: Secondary | ICD-10-CM | POA: Diagnosis not present

## 2023-04-25 DIAGNOSIS — J9601 Acute respiratory failure with hypoxia: Secondary | ICD-10-CM | POA: Diagnosis not present

## 2023-04-25 DIAGNOSIS — Z950 Presence of cardiac pacemaker: Secondary | ICD-10-CM | POA: Diagnosis not present

## 2023-04-25 DIAGNOSIS — E559 Vitamin D deficiency, unspecified: Secondary | ICD-10-CM | POA: Diagnosis not present

## 2023-04-26 DIAGNOSIS — J9601 Acute respiratory failure with hypoxia: Secondary | ICD-10-CM | POA: Diagnosis not present

## 2023-04-26 DIAGNOSIS — M6281 Muscle weakness (generalized): Secondary | ICD-10-CM | POA: Diagnosis not present

## 2023-04-26 DIAGNOSIS — R2689 Other abnormalities of gait and mobility: Secondary | ICD-10-CM | POA: Diagnosis not present

## 2023-05-22 DIAGNOSIS — L988 Other specified disorders of the skin and subcutaneous tissue: Secondary | ICD-10-CM | POA: Diagnosis not present

## 2023-05-22 DIAGNOSIS — I4891 Unspecified atrial fibrillation: Secondary | ICD-10-CM | POA: Diagnosis not present

## 2023-05-24 DIAGNOSIS — M542 Cervicalgia: Secondary | ICD-10-CM | POA: Diagnosis not present

## 2023-05-24 DIAGNOSIS — G9009 Other idiopathic peripheral autonomic neuropathy: Secondary | ICD-10-CM | POA: Diagnosis not present

## 2023-05-29 DIAGNOSIS — L988 Other specified disorders of the skin and subcutaneous tissue: Secondary | ICD-10-CM | POA: Diagnosis not present

## 2023-06-05 DIAGNOSIS — R262 Difficulty in walking, not elsewhere classified: Secondary | ICD-10-CM | POA: Diagnosis not present

## 2023-06-05 DIAGNOSIS — L988 Other specified disorders of the skin and subcutaneous tissue: Secondary | ICD-10-CM | POA: Diagnosis not present

## 2023-06-05 DIAGNOSIS — R531 Weakness: Secondary | ICD-10-CM | POA: Diagnosis not present

## 2023-06-05 DIAGNOSIS — J9601 Acute respiratory failure with hypoxia: Secondary | ICD-10-CM | POA: Diagnosis not present

## 2023-06-06 DIAGNOSIS — R531 Weakness: Secondary | ICD-10-CM | POA: Diagnosis not present

## 2023-06-06 DIAGNOSIS — R262 Difficulty in walking, not elsewhere classified: Secondary | ICD-10-CM | POA: Diagnosis not present

## 2023-06-06 DIAGNOSIS — J9601 Acute respiratory failure with hypoxia: Secondary | ICD-10-CM | POA: Diagnosis not present

## 2023-06-07 DIAGNOSIS — R262 Difficulty in walking, not elsewhere classified: Secondary | ICD-10-CM | POA: Diagnosis not present

## 2023-06-07 DIAGNOSIS — R531 Weakness: Secondary | ICD-10-CM | POA: Diagnosis not present

## 2023-06-07 DIAGNOSIS — J9601 Acute respiratory failure with hypoxia: Secondary | ICD-10-CM | POA: Diagnosis not present

## 2023-06-08 DIAGNOSIS — R262 Difficulty in walking, not elsewhere classified: Secondary | ICD-10-CM | POA: Diagnosis not present

## 2023-06-08 DIAGNOSIS — R531 Weakness: Secondary | ICD-10-CM | POA: Diagnosis not present

## 2023-06-08 DIAGNOSIS — J9601 Acute respiratory failure with hypoxia: Secondary | ICD-10-CM | POA: Diagnosis not present

## 2023-06-09 DIAGNOSIS — B370 Candidal stomatitis: Secondary | ICD-10-CM | POA: Diagnosis not present

## 2023-06-09 DIAGNOSIS — R262 Difficulty in walking, not elsewhere classified: Secondary | ICD-10-CM | POA: Diagnosis not present

## 2023-06-09 DIAGNOSIS — J9601 Acute respiratory failure with hypoxia: Secondary | ICD-10-CM | POA: Diagnosis not present

## 2023-06-09 DIAGNOSIS — R531 Weakness: Secondary | ICD-10-CM | POA: Diagnosis not present

## 2023-06-12 DIAGNOSIS — G4709 Other insomnia: Secondary | ICD-10-CM | POA: Diagnosis not present

## 2023-06-12 DIAGNOSIS — B370 Candidal stomatitis: Secondary | ICD-10-CM | POA: Diagnosis not present

## 2023-06-12 DIAGNOSIS — R34 Anuria and oliguria: Secondary | ICD-10-CM | POA: Diagnosis not present

## 2023-06-12 DIAGNOSIS — R262 Difficulty in walking, not elsewhere classified: Secondary | ICD-10-CM | POA: Diagnosis not present

## 2023-06-12 DIAGNOSIS — R531 Weakness: Secondary | ICD-10-CM | POA: Diagnosis not present

## 2023-06-12 DIAGNOSIS — L988 Other specified disorders of the skin and subcutaneous tissue: Secondary | ICD-10-CM | POA: Diagnosis not present

## 2023-06-12 DIAGNOSIS — R109 Unspecified abdominal pain: Secondary | ICD-10-CM | POA: Diagnosis not present

## 2023-06-12 DIAGNOSIS — R1084 Generalized abdominal pain: Secondary | ICD-10-CM | POA: Diagnosis not present

## 2023-06-12 DIAGNOSIS — J9601 Acute respiratory failure with hypoxia: Secondary | ICD-10-CM | POA: Diagnosis not present

## 2023-06-13 DIAGNOSIS — R531 Weakness: Secondary | ICD-10-CM | POA: Diagnosis not present

## 2023-06-13 DIAGNOSIS — Z79899 Other long term (current) drug therapy: Secondary | ICD-10-CM | POA: Diagnosis not present

## 2023-06-13 DIAGNOSIS — J9601 Acute respiratory failure with hypoxia: Secondary | ICD-10-CM | POA: Diagnosis not present

## 2023-06-13 DIAGNOSIS — R262 Difficulty in walking, not elsewhere classified: Secondary | ICD-10-CM | POA: Diagnosis not present

## 2023-06-14 DIAGNOSIS — J9601 Acute respiratory failure with hypoxia: Secondary | ICD-10-CM | POA: Diagnosis not present

## 2023-06-14 DIAGNOSIS — R262 Difficulty in walking, not elsewhere classified: Secondary | ICD-10-CM | POA: Diagnosis not present

## 2023-06-14 DIAGNOSIS — R531 Weakness: Secondary | ICD-10-CM | POA: Diagnosis not present

## 2023-06-15 DIAGNOSIS — R262 Difficulty in walking, not elsewhere classified: Secondary | ICD-10-CM | POA: Diagnosis not present

## 2023-06-15 DIAGNOSIS — J9601 Acute respiratory failure with hypoxia: Secondary | ICD-10-CM | POA: Diagnosis not present

## 2023-06-15 DIAGNOSIS — R531 Weakness: Secondary | ICD-10-CM | POA: Diagnosis not present

## 2023-06-15 DIAGNOSIS — N390 Urinary tract infection, site not specified: Secondary | ICD-10-CM | POA: Diagnosis not present

## 2023-06-16 DIAGNOSIS — J9601 Acute respiratory failure with hypoxia: Secondary | ICD-10-CM | POA: Diagnosis not present

## 2023-06-16 DIAGNOSIS — R262 Difficulty in walking, not elsewhere classified: Secondary | ICD-10-CM | POA: Diagnosis not present

## 2023-06-16 DIAGNOSIS — R531 Weakness: Secondary | ICD-10-CM | POA: Diagnosis not present

## 2023-06-19 DIAGNOSIS — J9601 Acute respiratory failure with hypoxia: Secondary | ICD-10-CM | POA: Diagnosis not present

## 2023-06-19 DIAGNOSIS — R531 Weakness: Secondary | ICD-10-CM | POA: Diagnosis not present

## 2023-06-19 DIAGNOSIS — R1312 Dysphagia, oropharyngeal phase: Secondary | ICD-10-CM | POA: Diagnosis not present

## 2023-06-19 DIAGNOSIS — R262 Difficulty in walking, not elsewhere classified: Secondary | ICD-10-CM | POA: Diagnosis not present

## 2023-06-19 DIAGNOSIS — L988 Other specified disorders of the skin and subcutaneous tissue: Secondary | ICD-10-CM | POA: Diagnosis not present

## 2023-06-20 DIAGNOSIS — R1312 Dysphagia, oropharyngeal phase: Secondary | ICD-10-CM | POA: Diagnosis not present

## 2023-06-20 DIAGNOSIS — R262 Difficulty in walking, not elsewhere classified: Secondary | ICD-10-CM | POA: Diagnosis not present

## 2023-06-20 DIAGNOSIS — R531 Weakness: Secondary | ICD-10-CM | POA: Diagnosis not present

## 2023-06-20 DIAGNOSIS — J9601 Acute respiratory failure with hypoxia: Secondary | ICD-10-CM | POA: Diagnosis not present

## 2023-06-21 DIAGNOSIS — I4891 Unspecified atrial fibrillation: Secondary | ICD-10-CM | POA: Diagnosis not present

## 2023-06-21 DIAGNOSIS — F32A Depression, unspecified: Secondary | ICD-10-CM | POA: Diagnosis not present

## 2023-06-21 DIAGNOSIS — G4700 Insomnia, unspecified: Secondary | ICD-10-CM | POA: Diagnosis not present

## 2023-06-21 DIAGNOSIS — J9610 Chronic respiratory failure, unspecified whether with hypoxia or hypercapnia: Secondary | ICD-10-CM | POA: Diagnosis not present

## 2023-06-27 DIAGNOSIS — C7902 Secondary malignant neoplasm of left kidney and renal pelvis: Secondary | ICD-10-CM | POA: Diagnosis not present

## 2023-06-27 DIAGNOSIS — L89626 Pressure-induced deep tissue damage of left heel: Secondary | ICD-10-CM | POA: Diagnosis not present

## 2023-06-27 DIAGNOSIS — L89153 Pressure ulcer of sacral region, stage 3: Secondary | ICD-10-CM | POA: Diagnosis not present

## 2023-06-28 DIAGNOSIS — I4891 Unspecified atrial fibrillation: Secondary | ICD-10-CM | POA: Diagnosis not present

## 2023-06-28 DIAGNOSIS — I10 Essential (primary) hypertension: Secondary | ICD-10-CM | POA: Diagnosis not present

## 2023-06-28 DIAGNOSIS — M6281 Muscle weakness (generalized): Secondary | ICD-10-CM | POA: Diagnosis not present

## 2023-06-28 DIAGNOSIS — R2689 Other abnormalities of gait and mobility: Secondary | ICD-10-CM | POA: Diagnosis not present

## 2023-07-01 DIAGNOSIS — R0602 Shortness of breath: Secondary | ICD-10-CM | POA: Diagnosis not present

## 2023-07-04 DIAGNOSIS — J9601 Acute respiratory failure with hypoxia: Secondary | ICD-10-CM | POA: Diagnosis not present

## 2023-07-04 DIAGNOSIS — L89626 Pressure-induced deep tissue damage of left heel: Secondary | ICD-10-CM | POA: Diagnosis not present

## 2023-07-04 DIAGNOSIS — C7902 Secondary malignant neoplasm of left kidney and renal pelvis: Secondary | ICD-10-CM | POA: Diagnosis not present

## 2023-07-04 DIAGNOSIS — L89153 Pressure ulcer of sacral region, stage 3: Secondary | ICD-10-CM | POA: Diagnosis not present

## 2023-07-05 DIAGNOSIS — R410 Disorientation, unspecified: Secondary | ICD-10-CM | POA: Diagnosis not present

## 2023-07-05 DIAGNOSIS — R001 Bradycardia, unspecified: Secondary | ICD-10-CM | POA: Diagnosis not present

## 2023-07-05 DIAGNOSIS — W19XXXA Unspecified fall, initial encounter: Secondary | ICD-10-CM | POA: Diagnosis not present

## 2023-07-05 DIAGNOSIS — S0990XA Unspecified injury of head, initial encounter: Secondary | ICD-10-CM | POA: Diagnosis not present

## 2023-07-05 DIAGNOSIS — R0689 Other abnormalities of breathing: Secondary | ICD-10-CM | POA: Diagnosis not present

## 2023-07-07 DIAGNOSIS — E876 Hypokalemia: Secondary | ICD-10-CM | POA: Diagnosis not present

## 2023-07-10 DIAGNOSIS — E872 Acidosis, unspecified: Secondary | ICD-10-CM | POA: Diagnosis not present

## 2023-07-10 DIAGNOSIS — N390 Urinary tract infection, site not specified: Secondary | ICD-10-CM | POA: Diagnosis not present

## 2023-07-10 DIAGNOSIS — K5733 Diverticulitis of large intestine without perforation or abscess with bleeding: Secondary | ICD-10-CM | POA: Diagnosis not present

## 2023-07-10 DIAGNOSIS — N321 Vesicointestinal fistula: Secondary | ICD-10-CM | POA: Diagnosis not present

## 2023-07-10 DIAGNOSIS — F0394 Unspecified dementia, unspecified severity, with anxiety: Secondary | ICD-10-CM | POA: Diagnosis not present

## 2023-07-10 DIAGNOSIS — K5732 Diverticulitis of large intestine without perforation or abscess without bleeding: Secondary | ICD-10-CM | POA: Diagnosis not present

## 2023-07-10 DIAGNOSIS — G9340 Encephalopathy, unspecified: Secondary | ICD-10-CM | POA: Diagnosis not present

## 2023-07-10 DIAGNOSIS — Z7901 Long term (current) use of anticoagulants: Secondary | ICD-10-CM | POA: Diagnosis not present

## 2023-07-10 DIAGNOSIS — G9341 Metabolic encephalopathy: Secondary | ICD-10-CM | POA: Diagnosis not present

## 2023-07-10 DIAGNOSIS — C649 Malignant neoplasm of unspecified kidney, except renal pelvis: Secondary | ICD-10-CM | POA: Diagnosis not present

## 2023-07-10 DIAGNOSIS — A419 Sepsis, unspecified organism: Secondary | ICD-10-CM | POA: Diagnosis not present

## 2023-07-10 DIAGNOSIS — Z7189 Other specified counseling: Secondary | ICD-10-CM | POA: Diagnosis not present

## 2023-07-10 DIAGNOSIS — Z515 Encounter for palliative care: Secondary | ICD-10-CM | POA: Diagnosis not present

## 2023-07-10 DIAGNOSIS — G629 Polyneuropathy, unspecified: Secondary | ICD-10-CM | POA: Diagnosis not present

## 2023-07-10 DIAGNOSIS — I4891 Unspecified atrial fibrillation: Secondary | ICD-10-CM | POA: Diagnosis not present

## 2023-07-10 DIAGNOSIS — Z85528 Personal history of other malignant neoplasm of kidney: Secondary | ICD-10-CM | POA: Diagnosis not present

## 2023-07-10 DIAGNOSIS — Z66 Do not resuscitate: Secondary | ICD-10-CM | POA: Diagnosis not present

## 2023-07-10 DIAGNOSIS — E876 Hypokalemia: Secondary | ICD-10-CM | POA: Diagnosis not present

## 2023-07-10 DIAGNOSIS — I959 Hypotension, unspecified: Secondary | ICD-10-CM | POA: Diagnosis not present

## 2023-07-10 DIAGNOSIS — R319 Hematuria, unspecified: Secondary | ICD-10-CM | POA: Diagnosis not present

## 2023-07-11 DIAGNOSIS — N321 Vesicointestinal fistula: Secondary | ICD-10-CM | POA: Diagnosis not present

## 2023-07-12 DIAGNOSIS — G9341 Metabolic encephalopathy: Secondary | ICD-10-CM | POA: Diagnosis not present

## 2023-07-12 DIAGNOSIS — Z7189 Other specified counseling: Secondary | ICD-10-CM | POA: Diagnosis not present

## 2023-07-12 DIAGNOSIS — Z515 Encounter for palliative care: Secondary | ICD-10-CM | POA: Diagnosis not present

## 2023-07-12 DIAGNOSIS — N321 Vesicointestinal fistula: Secondary | ICD-10-CM | POA: Diagnosis not present

## 2023-07-12 DIAGNOSIS — A419 Sepsis, unspecified organism: Secondary | ICD-10-CM | POA: Diagnosis not present

## 2023-07-20 DEATH — deceased
